# Patient Record
Sex: Female | Born: 1980
Health system: Southern US, Community
[De-identification: ages and names within clinical notes are randomized; demographics above are authoritative.]

## PROBLEM LIST (undated history)

## (undated) ENCOUNTER — Emergency Department (HOSPITAL_BASED_OUTPATIENT_CLINIC_OR_DEPARTMENT_OTHER): Payer: BC Managed Care – PPO

## (undated) DIAGNOSIS — IMO0002 Reserved for concepts with insufficient information to code with codable children: Secondary | ICD-10-CM

## (undated) DIAGNOSIS — B951 Streptococcus, group B, as the cause of diseases classified elsewhere: Secondary | ICD-10-CM

## (undated) DIAGNOSIS — O98819 Other maternal infectious and parasitic diseases complicating pregnancy, unspecified trimester: Secondary | ICD-10-CM

## (undated) DIAGNOSIS — F329 Major depressive disorder, single episode, unspecified: Secondary | ICD-10-CM

## (undated) DIAGNOSIS — E0590 Thyrotoxicosis, unspecified without thyrotoxic crisis or storm: Secondary | ICD-10-CM

## (undated) DIAGNOSIS — I10 Essential (primary) hypertension: Secondary | ICD-10-CM

## (undated) DIAGNOSIS — Z8619 Personal history of other infectious and parasitic diseases: Secondary | ICD-10-CM

## (undated) DIAGNOSIS — R87619 Unspecified abnormal cytological findings in specimens from cervix uteri: Secondary | ICD-10-CM

## (undated) DIAGNOSIS — Z3480 Encounter for supervision of other normal pregnancy, unspecified trimester: Secondary | ICD-10-CM

## (undated) DIAGNOSIS — L709 Acne, unspecified: Secondary | ICD-10-CM

## (undated) DIAGNOSIS — F32A Depression, unspecified: Secondary | ICD-10-CM

## (undated) DIAGNOSIS — F419 Anxiety disorder, unspecified: Secondary | ICD-10-CM

## (undated) DIAGNOSIS — T7840XA Allergy, unspecified, initial encounter: Secondary | ICD-10-CM

## (undated) HISTORY — DX: Depression, unspecified: F32.A

## (undated) HISTORY — DX: Thyrotoxicosis, unspecified without thyrotoxic crisis or storm: E05.90

## (undated) HISTORY — DX: Unspecified abnormal cytological findings in specimens from cervix uteri: R87.619

## (undated) HISTORY — DX: Acne, unspecified: L70.9

## (undated) HISTORY — DX: Reserved for concepts with insufficient information to code with codable children: IMO0002

## (undated) HISTORY — DX: Essential (primary) hypertension: I10

## (undated) HISTORY — DX: Allergy, unspecified, initial encounter: T78.40XA

## (undated) HISTORY — PX: BUNIONECTOMY: SHX129

## (undated) HISTORY — DX: Personal history of other infectious and parasitic diseases: Z86.19

## (undated) HISTORY — DX: Major depressive disorder, single episode, unspecified: F32.9

## (undated) HISTORY — PX: FOOT SURGERY: SHX648

## (undated) HISTORY — DX: Anxiety disorder, unspecified: F41.9

## (undated) HISTORY — PX: WISDOM TOOTH EXTRACTION: SHX21

---
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
## 2000-08-11 ENCOUNTER — Other Ambulatory Visit: Admission: RE | Admit: 2000-08-11 | Discharge: 2000-08-11 | Payer: Self-pay | Admitting: Internal Medicine

## 2001-09-03 ENCOUNTER — Other Ambulatory Visit: Admission: RE | Admit: 2001-09-03 | Discharge: 2001-09-03 | Payer: Self-pay | Admitting: Internal Medicine

## 2002-01-28 ENCOUNTER — Other Ambulatory Visit: Admission: RE | Admit: 2002-01-28 | Discharge: 2002-01-28 | Payer: Self-pay | Admitting: Internal Medicine

## 2002-09-10 ENCOUNTER — Other Ambulatory Visit: Admission: RE | Admit: 2002-09-10 | Discharge: 2002-09-10 | Payer: Self-pay | Admitting: Obstetrics and Gynecology

## 2003-03-31 ENCOUNTER — Other Ambulatory Visit: Admission: RE | Admit: 2003-03-31 | Discharge: 2003-03-31 | Payer: Self-pay | Admitting: Obstetrics and Gynecology

## 2003-09-15 ENCOUNTER — Other Ambulatory Visit: Admission: RE | Admit: 2003-09-15 | Discharge: 2003-09-15 | Payer: Self-pay | Admitting: Obstetrics and Gynecology

## 2004-03-16 ENCOUNTER — Other Ambulatory Visit: Admission: RE | Admit: 2004-03-16 | Discharge: 2004-03-16 | Payer: Self-pay | Admitting: Obstetrics and Gynecology

## 2004-10-07 ENCOUNTER — Other Ambulatory Visit: Admission: RE | Admit: 2004-10-07 | Discharge: 2004-10-07 | Payer: Self-pay | Admitting: Obstetrics and Gynecology

## 2004-11-12 ENCOUNTER — Ambulatory Visit: Payer: Self-pay | Admitting: Internal Medicine

## 2005-01-28 ENCOUNTER — Ambulatory Visit: Payer: Self-pay | Admitting: Internal Medicine

## 2005-03-14 ENCOUNTER — Ambulatory Visit: Payer: Self-pay | Admitting: Internal Medicine

## 2005-06-17 ENCOUNTER — Other Ambulatory Visit: Admission: RE | Admit: 2005-06-17 | Discharge: 2005-06-17 | Payer: Self-pay | Admitting: Obstetrics and Gynecology

## 2005-10-26 ENCOUNTER — Encounter: Admission: RE | Admit: 2005-10-26 | Discharge: 2005-10-26 | Payer: Self-pay | Admitting: Obstetrics and Gynecology

## 2005-12-29 ENCOUNTER — Inpatient Hospital Stay (HOSPITAL_COMMUNITY): Admission: AD | Admit: 2005-12-29 | Discharge: 2006-01-02 | Payer: Self-pay | Admitting: Obstetrics and Gynecology

## 2006-03-17 ENCOUNTER — Ambulatory Visit: Payer: Self-pay | Admitting: Internal Medicine

## 2006-07-20 ENCOUNTER — Ambulatory Visit: Payer: Self-pay | Admitting: Internal Medicine

## 2006-07-26 ENCOUNTER — Ambulatory Visit: Payer: Self-pay | Admitting: Internal Medicine

## 2006-12-22 ENCOUNTER — Ambulatory Visit: Payer: Self-pay | Admitting: Internal Medicine

## 2007-01-22 ENCOUNTER — Other Ambulatory Visit: Admission: RE | Admit: 2007-01-22 | Discharge: 2007-01-22 | Payer: Self-pay | Admitting: Gynecology

## 2007-01-26 ENCOUNTER — Ambulatory Visit: Payer: Self-pay | Admitting: Internal Medicine

## 2007-04-24 DIAGNOSIS — J020 Streptococcal pharyngitis: Secondary | ICD-10-CM | POA: Insufficient documentation

## 2007-04-27 ENCOUNTER — Ambulatory Visit: Payer: Self-pay | Admitting: Internal Medicine

## 2007-04-27 DIAGNOSIS — I10 Essential (primary) hypertension: Secondary | ICD-10-CM | POA: Insufficient documentation

## 2007-04-27 DIAGNOSIS — L708 Other acne: Secondary | ICD-10-CM | POA: Insufficient documentation

## 2007-05-01 ENCOUNTER — Telehealth (INDEPENDENT_AMBULATORY_CARE_PROVIDER_SITE_OTHER): Payer: Self-pay | Admitting: *Deleted

## 2007-07-02 ENCOUNTER — Telehealth: Payer: Self-pay | Admitting: Internal Medicine

## 2007-07-03 ENCOUNTER — Telehealth (INDEPENDENT_AMBULATORY_CARE_PROVIDER_SITE_OTHER): Payer: Self-pay | Admitting: *Deleted

## 2007-07-09 ENCOUNTER — Telehealth (INDEPENDENT_AMBULATORY_CARE_PROVIDER_SITE_OTHER): Payer: Self-pay | Admitting: *Deleted

## 2007-08-31 ENCOUNTER — Ambulatory Visit: Payer: Self-pay | Admitting: Internal Medicine

## 2007-08-31 DIAGNOSIS — J309 Allergic rhinitis, unspecified: Secondary | ICD-10-CM | POA: Insufficient documentation

## 2007-10-09 ENCOUNTER — Telehealth (INDEPENDENT_AMBULATORY_CARE_PROVIDER_SITE_OTHER): Payer: Self-pay | Admitting: *Deleted

## 2007-11-26 ENCOUNTER — Telehealth: Payer: Self-pay | Admitting: Internal Medicine

## 2007-11-30 ENCOUNTER — Ambulatory Visit: Payer: Self-pay | Admitting: Internal Medicine

## 2008-01-14 ENCOUNTER — Telehealth: Payer: Self-pay | Admitting: Internal Medicine

## 2008-02-01 ENCOUNTER — Telehealth: Payer: Self-pay | Admitting: Internal Medicine

## 2008-03-20 ENCOUNTER — Encounter: Admission: RE | Admit: 2008-03-20 | Discharge: 2008-03-20 | Payer: Self-pay | Admitting: Gynecology

## 2008-04-04 ENCOUNTER — Ambulatory Visit: Payer: Self-pay | Admitting: Internal Medicine

## 2008-04-04 DIAGNOSIS — F411 Generalized anxiety disorder: Secondary | ICD-10-CM | POA: Insufficient documentation

## 2008-04-04 LAB — CONVERTED CEMR LAB
CO2: 27 meq/L (ref 19–32)
Calcium: 9.5 mg/dL (ref 8.4–10.5)
Creatinine, Ser: 0.6 mg/dL (ref 0.4–1.2)
GFR calc Af Amer: 155 mL/min
Glucose, Bld: 77 mg/dL (ref 70–99)
Sodium: 137 meq/L (ref 135–145)

## 2008-04-09 ENCOUNTER — Telehealth: Payer: Self-pay | Admitting: Internal Medicine

## 2008-06-10 ENCOUNTER — Telehealth: Payer: Self-pay | Admitting: *Deleted

## 2008-06-24 ENCOUNTER — Telehealth: Payer: Self-pay | Admitting: Internal Medicine

## 2008-06-27 ENCOUNTER — Ambulatory Visit: Payer: Self-pay | Admitting: Internal Medicine

## 2008-06-27 DIAGNOSIS — B9689 Other specified bacterial agents as the cause of diseases classified elsewhere: Secondary | ICD-10-CM | POA: Insufficient documentation

## 2008-06-27 DIAGNOSIS — K5900 Constipation, unspecified: Secondary | ICD-10-CM | POA: Insufficient documentation

## 2008-06-27 DIAGNOSIS — N760 Acute vaginitis: Secondary | ICD-10-CM | POA: Insufficient documentation

## 2008-06-28 ENCOUNTER — Encounter: Payer: Self-pay | Admitting: Internal Medicine

## 2008-06-30 LAB — CONVERTED CEMR LAB: Chlamydia, DNA Probe: NEGATIVE

## 2008-07-11 ENCOUNTER — Telehealth: Payer: Self-pay | Admitting: Internal Medicine

## 2008-10-21 ENCOUNTER — Telehealth: Payer: Self-pay | Admitting: Internal Medicine

## 2008-11-06 ENCOUNTER — Telehealth: Payer: Self-pay | Admitting: Internal Medicine

## 2008-12-18 ENCOUNTER — Ambulatory Visit: Payer: Self-pay | Admitting: Internal Medicine

## 2008-12-18 LAB — CONVERTED CEMR LAB
ALT: 14 units/L (ref 0–35)
AST: 16 units/L (ref 0–37)
Albumin: 3.5 g/dL (ref 3.5–5.2)
Alkaline Phosphatase: 39 units/L (ref 39–117)
Basophils Relative: 0.4 % (ref 0.0–3.0)
Bilirubin, Direct: 0.1 mg/dL (ref 0.0–0.3)
CO2: 28 meq/L (ref 19–32)
Calcium: 8.7 mg/dL (ref 8.4–10.5)
Creatinine, Ser: 0.8 mg/dL (ref 0.4–1.2)
Eosinophils Absolute: 0.2 10*3/uL (ref 0.0–0.7)
Eosinophils Relative: 1.9 % (ref 0.0–5.0)
GFR calc non Af Amer: 110.36 mL/min (ref >60)
HDL: 44 mg/dL (ref >39.00)
Hemoglobin, Urine: NEGATIVE
Hemoglobin: 12.4 g/dL (ref 12.0–15.0)
Ketones, ur: NEGATIVE mg/dL
LDL Cholesterol: 73 mg/dL (ref 0–99)
Lymphocytes Relative: 26.5 % (ref 12.0–46.0)
Monocytes Relative: 6.1 % (ref 3.0–12.0)
Neutro Abs: 6.1 10*3/uL (ref 1.4–7.7)
Neutrophils Relative %: 65.1 % (ref 43.0–77.0)
RBC: 4.08 M/uL (ref 3.87–5.11)
Sodium: 139 meq/L (ref 135–145)
Total CHOL/HDL Ratio: 3
Total Protein, Urine: NEGATIVE mg/dL
Total Protein: 6.6 g/dL (ref 6.0–8.3)
Triglycerides: 67 mg/dL (ref 0.0–149.0)
Urine Glucose: NEGATIVE mg/dL
WBC: 9.4 10*3/uL (ref 4.5–10.5)

## 2008-12-25 ENCOUNTER — Ambulatory Visit: Payer: Self-pay | Admitting: Internal Medicine

## 2008-12-26 ENCOUNTER — Telehealth: Payer: Self-pay | Admitting: Internal Medicine

## 2009-02-27 ENCOUNTER — Encounter: Payer: Self-pay | Admitting: Internal Medicine

## 2009-03-11 ENCOUNTER — Telehealth (INDEPENDENT_AMBULATORY_CARE_PROVIDER_SITE_OTHER): Payer: Self-pay | Admitting: *Deleted

## 2009-04-03 ENCOUNTER — Telehealth: Payer: Self-pay | Admitting: Internal Medicine

## 2009-05-08 ENCOUNTER — Telehealth: Payer: Self-pay | Admitting: Internal Medicine

## 2009-05-08 DIAGNOSIS — F329 Major depressive disorder, single episode, unspecified: Secondary | ICD-10-CM | POA: Insufficient documentation

## 2009-05-14 ENCOUNTER — Ambulatory Visit: Payer: Self-pay | Admitting: Licensed Clinical Social Worker

## 2009-05-14 ENCOUNTER — Encounter: Admission: RE | Admit: 2009-05-14 | Discharge: 2009-05-14 | Payer: Self-pay | Admitting: Gynecology

## 2009-05-29 ENCOUNTER — Telehealth: Payer: Self-pay | Admitting: Internal Medicine

## 2009-06-19 ENCOUNTER — Ambulatory Visit: Payer: Self-pay | Admitting: Internal Medicine

## 2009-06-19 LAB — CONVERTED CEMR LAB
CO2: 28 meq/L (ref 19–32)
Chloride: 107 meq/L (ref 96–112)
Creatinine, Ser: 0.7 mg/dL (ref 0.4–1.2)
Glucose, Bld: 95 mg/dL (ref 70–99)

## 2009-06-26 ENCOUNTER — Ambulatory Visit: Payer: Self-pay | Admitting: Internal Medicine

## 2009-08-04 ENCOUNTER — Emergency Department (HOSPITAL_COMMUNITY): Admission: EM | Admit: 2009-08-04 | Discharge: 2009-08-04 | Payer: Self-pay | Admitting: Family Medicine

## 2009-08-05 ENCOUNTER — Encounter (INDEPENDENT_AMBULATORY_CARE_PROVIDER_SITE_OTHER): Payer: Self-pay | Admitting: *Deleted

## 2009-08-12 ENCOUNTER — Ambulatory Visit: Payer: Self-pay | Admitting: Internal Medicine

## 2009-08-18 ENCOUNTER — Telehealth: Payer: Self-pay | Admitting: Internal Medicine

## 2009-09-02 ENCOUNTER — Telehealth: Payer: Self-pay | Admitting: Internal Medicine

## 2009-10-14 ENCOUNTER — Ambulatory Visit: Payer: Self-pay | Admitting: Internal Medicine

## 2009-10-14 DIAGNOSIS — R635 Abnormal weight gain: Secondary | ICD-10-CM | POA: Insufficient documentation

## 2009-10-21 DIAGNOSIS — M940 Chondrocostal junction syndrome [Tietze]: Secondary | ICD-10-CM | POA: Insufficient documentation

## 2009-10-30 ENCOUNTER — Telehealth: Payer: Self-pay | Admitting: Internal Medicine

## 2009-12-18 ENCOUNTER — Ambulatory Visit: Payer: Self-pay | Admitting: Internal Medicine

## 2010-01-26 ENCOUNTER — Telehealth: Payer: Self-pay | Admitting: Internal Medicine

## 2010-03-19 ENCOUNTER — Ambulatory Visit: Payer: Self-pay | Admitting: Internal Medicine

## 2010-04-15 ENCOUNTER — Encounter: Payer: Self-pay | Admitting: Internal Medicine

## 2010-04-26 ENCOUNTER — Ambulatory Visit: Payer: Self-pay | Admitting: Internal Medicine

## 2010-04-29 ENCOUNTER — Telehealth: Payer: Self-pay | Admitting: Internal Medicine

## 2010-05-14 ENCOUNTER — Telehealth: Payer: Self-pay | Admitting: Internal Medicine

## 2010-05-28 ENCOUNTER — Telehealth: Payer: Self-pay | Admitting: Internal Medicine

## 2010-05-31 ENCOUNTER — Encounter: Admission: RE | Admit: 2010-05-31 | Discharge: 2010-05-31 | Payer: Self-pay | Admitting: Gynecology

## 2010-06-07 ENCOUNTER — Ambulatory Visit: Payer: Self-pay | Admitting: Internal Medicine

## 2010-06-16 ENCOUNTER — Telehealth: Payer: Self-pay | Admitting: Internal Medicine

## 2010-06-29 ENCOUNTER — Telehealth: Payer: Self-pay | Admitting: Internal Medicine

## 2010-06-30 ENCOUNTER — Telehealth: Payer: Self-pay | Admitting: Internal Medicine

## 2010-06-30 DIAGNOSIS — R142 Eructation: Secondary | ICD-10-CM

## 2010-06-30 DIAGNOSIS — R143 Flatulence: Secondary | ICD-10-CM

## 2010-06-30 DIAGNOSIS — R141 Gas pain: Secondary | ICD-10-CM | POA: Insufficient documentation

## 2010-07-20 ENCOUNTER — Encounter: Payer: Self-pay | Admitting: Internal Medicine

## 2010-09-03 ENCOUNTER — Telehealth: Payer: Self-pay | Admitting: Internal Medicine

## 2010-09-08 ENCOUNTER — Telehealth: Payer: Self-pay | Admitting: Internal Medicine

## 2010-09-09 ENCOUNTER — Ambulatory Visit: Payer: Self-pay | Admitting: Family Medicine

## 2010-09-09 LAB — CONVERTED CEMR LAB
Bilirubin Urine: NEGATIVE
Blood in Urine, dipstick: NEGATIVE
Glucose, Urine, Semiquant: NEGATIVE
Urobilinogen, UA: 0.2
pH: 7

## 2010-10-17 LAB — CONVERTED CEMR LAB: Pap Smear: NORMAL

## 2010-10-19 NOTE — Progress Notes (Signed)
Summary: med request  Phone Note Call from Patient Call back at [phone]   Summary of Call: Has appt 10-19.  Requesting med for symptoms of odor & itching female area, on antibiotic from foot surgery Mon Dr. Lysle Morales,  cephalexin 500mg  by mouth & IV during surgery.  Also caused itching all over & they have had her stop it.  Taking Benadryl.  [facility]  NK[facility].   Initial call taken by: Rudy Jew, RN,  June 16, 2010 9:55 AM  Follow-up for Phone Call        per dr Lovell Sheehan- may have flagyl 500 two times a day for 7 days Follow-up by: Willy Eddy, LPN,  June 16, 2010 10:36 AM   New Allergies: ! CEPHALEXIN (CEPHALEXIN) New/Updated Medications: METRONIDAZOLE 500 MG TABS (METRONIDAZOLE) One two times a day for 7 days New Allergies: ! CEPHALEXIN (CEPHALEXIN)Prescriptions: METRONIDAZOLE 500 MG TABS (METRONIDAZOLE) One two times a day for 7 days  #14 x 0   Entered by:   Rudy Jew, RN   Authorized by:   Stacie Glaze MD   Signed by:   Rudy Jew, RN on 06/16/2010   Method used:   [facility] [facility] [address] [id number]       [address]       [place], [place]  [address]       Ph: [phone]       Fax: [phone]   RxID:   [id number]

## 2010-10-19 NOTE — Letter (Signed)
[facility]: [facility]   Imported By: Sherian Rein 08/04/2010 09:50:39  _____________________________________________________________________  External Attachment:    Type:   Image     Comment:   External Document

## 2010-10-19 NOTE — Assessment & Plan Note (Signed)
Summary: CONCERNS W/ MEDS // RS   Vital Signs:  Patient profile:   30 year old female Height:      67 inches Weight:      179 pounds Temp:     98.3 degrees F oral Pulse rate:   72 / minute Resp:     14 per minute BP sitting:   140 / 82  (left arm)  Vitals Entered By: Willy Eddy, LPN (October 14, 2009 11:59 AM) CC: DISCUSS MED REGIMEN   CC:  DISCUSS MED REGIMEN.  History of Present Illness: Issues of weight control still fatiques and lack of motivation overweight and the weight puts pressure on the rib cage the pt has body image issues that create increased epression has cut down with sodas and feels  still gaining weight  Preventive Screening-Counseling & Management  Alcohol-Tobacco     Smoking Status: never  Problems Prior to Update: 1)  Weight Gain  (ICD-783.1) 2)  Sebaceous Cyst, Neck  (ICD-706.2) 3)  Depression  (ICD-311) 4)  Preventive Health Care  (ICD-V70.0) 5)  Constipation, Mild  (ICD-564.00) 6)  Vaginitis, Bacterial  (ICD-616.10) 7)  Anxiety State, Unspecified  (ICD-300.00) 8)  Allergic Rhinitis  (ICD-477.9) 9)  Streptococcal Pharyngitis  (ICD-034.0) 10)  Acne Nec  (ICD-706.1) 11)  Hypertension  (ICD-401.9)  Medications Prior to Update: 1)  Yaz 3-0.02 Mg Tabs (Drospirenone-Ethinyl Estradiol) .... .uad 2)  Benicar Hct 20-12.5 Mg Tabs (Olmesartan Medoxomil-Hctz) .Marland Kitchen.. 1 Once Daily 3)  Sertraline Hcl 50 Mg Tabs (Sertraline Hcl) .... One By Mouth Daily 4)  Sertraline Hcl 100 Mg Tabs (Sertraline Hcl) .... One Daily  Current Medications (verified): 1)  Yaz 3-0.02 Mg Tabs (Drospirenone-Ethinyl Estradiol) .... .uad 2)  Benicar Hct 20-12.5 Mg Tabs (Olmesartan Medoxomil-Hctz) .Marland Kitchen.. 1 Once Daily 3)  Sertraline Hcl 100 Mg Tabs (Sertraline Hcl) .... One Daily 4)  Phentermine Hcl 37.5 Mg Caps (Phentermine Hcl) .... Onepo Daily  Allergies (verified): No Known Drug Allergies  Past History:  Family History: Last updated: 04/27/2007 Family History High  cholesterol Family History Hypertension  Social History: Last updated: 04/27/2007 Married Never Smoked  Risk Factors: Smoking Status: never (10/14/2009)  Past medical, surgical, family and social histories (including risk factors) reviewed, and no changes noted (except as noted below).  Past Medical History: Reviewed history from 08/31/2007 and no changes required. Hypertension 706.1 acne Allergic rhinitis  Past Surgical History: Reviewed history from 04/27/2007 and no changes required. wisdom teeth extraction 1998 childbirth 2007  Family History: Reviewed history from 04/27/2007 and no changes required. Family History High cholesterol Family History Hypertension  Social History: Reviewed history from 04/27/2007 and no changes required. Married Never Smoked  Review of Systems  The patient denies anorexia, fever, weight loss, weight gain, vision loss, decreased hearing, hoarseness, chest pain, syncope, dyspnea on exertion, peripheral edema, prolonged cough, headaches, hemoptysis, abdominal pain, melena, hematochezia, severe indigestion/heartburn, hematuria, incontinence, genital sores, muscle weakness, suspicious skin lesions, transient blindness, difficulty walking, depression, unusual weight change, abnormal bleeding, enlarged lymph nodes, angioedema, and breast masses.    Physical Exam  General:  Well-developed,well-nourished,in no acute distress; alert,appropriate and cooperative throughout examination Head:  Normocephalic and atraumatic without obvious abnormalities. No apparent alopecia or balding. Ears:  R ear normal and L ear normal.   Nose:  no external deformity and no nasal discharge.   Neck:  No deformities, masses, or tenderness noted. Chest Wall:  no deformities and costochondrial tenderness.   Lungs:  normal respiratory effort and no intercostal retractions.  Heart:  normal rate and regular rhythm.   Abdomen:  soft and non-tender.     Impression &  Recommendations:  Problem # 1:  DEPRESSION (ICD-311)  The following medications were removed from the medication list:    Sertraline Hcl 50 Mg Tabs (Sertraline hcl) ..... One by mouth daily Her updated medication list for this problem includes:    Sertraline Hcl 100 Mg Tabs (Sertraline hcl) ..... One daily  Discussed treatment options, including trial of antidpressant medication. Will refer to behavioral health. Follow-up call in in 24-48 hours and recheck in 2 weeks, sooner as needed. Patient agrees to call if any worsening of symptoms or thoughts of doing harm arise. Verified that the patient has no suicidal ideation at this time.   Problem # 2:  WEIGHT GAIN (ICD-783.1)  Problem # 3:  HYPERTENSION (ICD-401.9)  Her updated medication list for this problem includes:    Benicar Hct 20-12.5 Mg Tabs (Olmesartan medoxomil-hctz) .Marland Kitchen... 1 once daily  BP today: 140/82 Prior BP: 140/80 (08/12/2009)  Prior 10 Yr Risk Heart Disease: 1 % (12/25/2008)  Labs Reviewed: K+: 4.0 (06/19/2009) Creat: : 0.7 (06/19/2009)   Chol: 130 (12/18/2008)   HDL: 44.00 (12/18/2008)   LDL: 73 (12/18/2008)   TG: 67.0 (12/18/2008)  Problem # 4:  COSTOCHONDRITIS (ICD-733.6) mild persistnat rib cage tenderness pssible due to position at work , ergonomic discussion about chair and work position  Complete Medication List: 1)  Yaz 3-0.02 Mg Tabs (Drospirenone-ethinyl estradiol) .... .uad 2)  Benicar Hct 20-12.5 Mg Tabs (Olmesartan medoxomil-hctz) .Marland Kitchen.. 1 once daily 3)  Sertraline Hcl 100 Mg Tabs (Sertraline hcl) .... One daily 4)  Phentermine Hcl 37.5 Mg Caps (Phentermine hcl) .... Onepo daily  Patient Instructions: 1)  Please schedule a follow-up appointment in 2 months. Prescriptions: PHENTERMINE HCL 37.5 MG CAPS (PHENTERMINE HCL) onepo daily  #30 x 2   Entered and Authorized by:   Stacie Glaze MD   Signed by:   Stacie Glaze MD on 10/14/2009   Method used:   Print then Give to Patient   RxID:    [id number]

## 2010-10-19 NOTE — Progress Notes (Signed)
  Phone Note Call from Patient   Caller: Patient Call For: Stacie Glaze MD Complaint: Urinary/GYN Problems Summary of Call: C/o vaginal irritation; soreness, just finished cycle. No discharge. Can she have Diflucan- has had yeast inf before. [facility].  At home. Initial call taken by: Raechel Ache, RN,  October 30, 2009 2:38 PM  Follow-up for Phone Call        per dr Lovell Sheehan ok for 1 diflucan, but pt instructed to call gyn for ov if signs persist Follow-up by: Willy Eddy, LPN,  October 30, 2009 3:04 PM    New/Updated Medications: DIFLUCAN 150 MG TABS (FLUCONAZOLE) take Prescriptions: DIFLUCAN 150 MG TABS (FLUCONAZOLE) take  #1 x 0   Entered by:   Willy Eddy, LPN   Authorized by:   Stacie Glaze MD   Signed by:   Willy Eddy, LPN on 09/81/1914   Method used:   [facility]        [facility] [facility] [address] [phone]       [address]       [place], [place]  [address]       Ph: [phone]       Fax: [phone]   RxID:   [id number]

## 2010-10-19 NOTE — Letter (Signed)
Summary: Gynecology-Dr. Beather Arbour  Gynecology-Dr. Beather Arbour   Imported By: Maryln Gottron 04/26/2010 11:13:46  _____________________________________________________________________  External Attachment:    Type:   Image     Comment:   External Document

## 2010-10-19 NOTE — Assessment & Plan Note (Signed)
Summary: 6 weeks fup//ccm   Vital Signs:  Patient profile:   30 year old female Height:      67 inches Weight:      166 pounds BMI:     26.09 Temp:     98.2 degrees F oral Pulse rate:   80 / minute Resp:     14 per minute BP sitting:   126 / 82  (left arm)  Vitals Entered By: Willy Eddy, LPN (June 07, 2010 4:15 PM) CC: roa-viibrid- didnt help any, Hypertension Management Is Patient Diabetic? No   Primary Care Provider:  Stacie Glaze MD  CC:  roa-viibrid- didnt help any and Hypertension Management.  History of Present Illness: follow up of depression has foot surgery on monday has worries about out of work and is tearfull and "wants" to be happy she has not been on a mood stabilizer  Hypertension History:      She denies headache, chest pain, palpitations, dyspnea with exertion, orthopnea, PND, peripheral edema, visual symptoms, neurologic problems, syncope, and side effects from treatment.        Positive major cardiovascular risk factors include hypertension and family history for ischemic heart disease (females less than 36 years old & males less than 60 years old).  Negative major cardiovascular risk factors include female age less than 33 years old, no history of diabetes or hyperlipidemia, and non-tobacco-user status.        Further assessment for target organ damage reveals no history of ASHD, cardiac end-organ damage (CHF/LVH), stroke/TIA, peripheral vascular disease, renal insufficiency, or hypertensive retinopathy.     Preventive Screening-Counseling & Management  Alcohol-Tobacco     Smoking Status: never     Tobacco Counseling: not indicated; no tobacco use  Problems Prior to Update: 1)  Costochondritis  (ICD-733.6) 2)  Weight Gain  (ICD-783.1) 3)  Sebaceous Cyst, Neck  (ICD-706.2) 4)  Depression  (ICD-311) 5)  Preventive Health Care  (ICD-V70.0) 6)  Constipation, Mild  (ICD-564.00) 7)  Vaginitis, Bacterial  (ICD-616.10) 8)  Anxiety State,  Unspecified  (ICD-300.00) 9)  Allergic Rhinitis  (ICD-477.9) 10)  Streptococcal Pharyngitis  (ICD-034.0) 11)  Acne Nec  (ICD-706.1) 12)  Hypertension  (ICD-401.9)  Current Problems (verified): 1)  Costochondritis  (ICD-733.6) 2)  Weight Gain  (ICD-783.1) 3)  Sebaceous Cyst, Neck  (ICD-706.2) 4)  Depression  (ICD-311) 5)  Preventive Health Care  (ICD-V70.0) 6)  Constipation, Mild  (ICD-564.00) 7)  Vaginitis, Bacterial  (ICD-616.10) 8)  Anxiety State, Unspecified  (ICD-300.00) 9)  Allergic Rhinitis  (ICD-477.9) 10)  Streptococcal Pharyngitis  (ICD-034.0) 11)  Acne Nec  (ICD-706.1) 12)  Hypertension  (ICD-401.9)  Medications Prior to Update: 1)  Yaz 3-0.02 Mg Tabs (Drospirenone-Ethinyl Estradiol) .... .uad 2)  Benicar Hct 20-12.5 Mg Tabs (Olmesartan Medoxomil-Hctz) .Marland Kitchen.. 1 Once Daily 3)  Methylphenidate Hcl Cr 20 Mg Cr-Tabs (Methylphenidate Hcl) .... One By Mouth Daily 4)  Phentermine Hcl 37.5 Mg Tabs (Phentermine Hcl) .Marland Kitchen.. 1 Once Daily 5)  Viibrid .Marland Kitchen.. 58m By Mouth Daily 6)  Zithromax Z-Pak 250 Mg Tabs (Azithromycin) .... As Directed 7)  Allegra-D 24 Hour 180-240 Mg Xr24h-Tab (Fexofenadine-Pseudoephedrine) .... One By Mouth Daily 8)  Diflucan 100 Mg Tabs (Fluconazole) .... One Now and Repeat in 5 Days.  Current Medications (verified): 1)  Yaz 3-0.02 Mg Tabs (Drospirenone-Ethinyl Estradiol) .... .uad 2)  Benicar Hct 20-12.5 Mg Tabs (Olmesartan Medoxomil-Hctz) .Marland Kitchen.. 1 Once Daily 3)  Methylphenidate Hcl Cr 20 Mg Cr-Tabs (Methylphenidate Hcl) .... One By Mouth Daily  4)  Phentermine Hcl 37.5 Mg Tabs (Phentermine Hcl) .Marland Kitchen.. 1 Once Daily 5)  Viibrid .Marland Kitchen.. 60m By Mouth Daily 6)  Allegra-D 24 Hour 180-240 Mg Xr24h-Tab (Fexofenadine-Pseudoephedrine) .... One By Mouth Daily 7)  Lamictal Starter 25 (84)-100(14) Mg Kit (Lamotrigine) .... Take As Directed 8)  Lamotrigine 100 Mg Tabs (Lamotrigine) .... One By Mouth Two Times A Day  Allergies: 1)  ! Cephalexin  (Cephalexin)  Contraindications/Deferment of Procedures/Staging:    Test/Procedure: FLU VAX    Reason for deferment: patient declined   Past History:  Family History: Last updated: 04/27/2007 Family History High cholesterol Family History Hypertension  Social History: Last updated: 04/27/2007 Married Never Smoked  Risk Factors: Smoking Status: never (06/07/2010)  Past medical, surgical, family and social histories (including risk factors) reviewed, and no changes noted (except as noted below).  Past Medical History: Reviewed history from 08/31/2007 and no changes required. Hypertension 706.1 acne Allergic rhinitis  Past Surgical History: Reviewed history from 04/27/2007 and no changes required. wisdom teeth extraction 1998 childbirth 2007  Family History: Reviewed history from 04/27/2007 and no changes required. Family History High cholesterol Family History Hypertension  Social History: Reviewed history from 04/27/2007 and no changes required. Married Never Smoked  Review of Systems  The patient denies anorexia, fever, weight loss, weight gain, vision loss, decreased hearing, hoarseness, chest pain, syncope, dyspnea on exertion, peripheral edema, prolonged cough, headaches, hemoptysis, abdominal pain, melena, hematochezia, severe indigestion/heartburn, hematuria, incontinence, genital sores, muscle weakness, suspicious skin lesions, transient blindness, difficulty walking, depression, unusual weight change, abnormal bleeding, enlarged lymph nodes, angioedema, and breast masses.    Physical Exam  General:  Well-developed,well-nourished,in no acute distress; alert,appropriate and cooperative throughout examination Head:  Normocephalic and atraumatic without obvious abnormalities. No apparent alopecia or balding. Eyes:  pupils equal and pupils round.   Ears:  R ear normal and L ear normal.   Nose:  no external deformity and no nasal discharge.   Neck:  No  deformities, masses, or tenderness noted. Lungs:  normal respiratory effort and no intercostal retractions.   Heart:  normal rate and regular rhythm.   Abdomen:  soft and non-tender.   Msk:  No deformity or scoliosis noted of thoracic or lumbar spine.     Impression & Recommendations:  Problem # 1:  ANXIETY STATE, UNSPECIFIED (ICD-300.00)  lamictal trial as a mood stabilizer  Discussed medication use and relaxation techniques.   Problem # 2:  DEPRESSION (ICD-311) suspect a degreee iof bipolarity  Problem # 3:  HYPERTENSION (ICD-401.9)  Her updated medication list for this problem includes:    Benicar Hct 20-12.5 Mg Tabs (Olmesartan medoxomil-hctz) .Marland Kitchen... 1 once daily  BP today: 126/82 Prior BP: 140/84 (04/26/2010)  Prior 10 Yr Risk Heart Disease: 1 % (12/25/2008)  Labs Reviewed: K+: 4.0 (06/19/2009) Creat: : 0.7 (06/19/2009)   Chol: 130 (12/18/2008)   HDL: 44.00 (12/18/2008)   LDL: 73 (12/18/2008)   TG: 67.0 (12/18/2008)  Complete Medication List: 1)  Yaz 3-0.02 Mg Tabs (Drospirenone-ethinyl estradiol) .... .uad 2)  Benicar Hct 20-12.5 Mg Tabs (Olmesartan medoxomil-hctz) .Marland Kitchen.. 1 once daily 3)  Methylphenidate Hcl Cr 20 Mg Cr-tabs (Methylphenidate hcl) .... One by mouth daily 4)  Phentermine Hcl 37.5 Mg Tabs (Phentermine hcl) .Marland Kitchen.. 1 once daily 5)  Viibrid  .Marland Kitchen.. 36m by mouth daily 6)  Allegra-d 24 Hour 180-240 Mg Xr24h-tab (Fexofenadine-pseudoephedrine) .... One by mouth daily 7)  Lamictal Starter 25 (84)-100(14) Mg Kit (Lamotrigine) .... Take as directed 8)  Lamotrigine 100 Mg Tabs (Lamotrigine) .Marland KitchenMarland KitchenMarland Kitchen  One by mouth two times a day 9)  Metronidazole 500 Mg Tabs (Metronidazole) .... One two times a day for 7 days  Hypertension Assessment/Plan:      The patient's hypertensive risk group is category B: At least one risk factor (excluding diabetes) with no target organ damage.  Her calculated 10 year risk of coronary heart disease is 1 %.  Today's blood pressure is 126/82.  Her  blood pressure goal is < 140/90.  Patient Instructions: 1)  Please schedule a follow-up appointment in 6 weeks. Prescriptions: LAMOTRIGINE 100 MG TABS (LAMOTRIGINE) one by mouth two times a day  #180 x 0   Entered and Authorized by:   Stacie Glaze MD   Signed by:   Stacie Glaze MD on 06/07/2010   Method used:   Faxed to ...       [facility] (mail-order)             , [place]         Ph: [phone]       Fax: [phone]   RxID:   [id number] LAMICTAL STARTER 25 (84)-100(14) MG KIT (LAMOTRIGINE) take as directed  #1 pk x 0   Entered and Authorized by:   Stacie Glaze MD   Signed by:   Stacie Glaze MD on 06/07/2010   Method used:   [facility]        [facility] [address] [phone][facility]       [address]       [place], [place]  [address]       Ph: [phone]       Fax: [phone]   RxID:   [id number]

## 2010-10-19 NOTE — Assessment & Plan Note (Signed)
Summary: 2 MTH ROV // RS   Vital Signs:  Patient profile:   30 year old female Height:      67 inches Weight:      166 pounds BMI:     26.09 Temp:     98.2 degrees F oral Pulse rate:   76 / minute Resp:     14 per minute BP sitting:   130 / 80  (left arm)  Vitals Entered By: Willy Eddy, LPN (December 19, 6043 2:46 PM) CC: roa, Hypertension Management   CC:  roa and Hypertension Management.  History of Present Illness: follow up blood pressure loosing weight with the appetitie supressants exercizing  Hypertension History:      She denies headache, chest pain, palpitations, dyspnea with exertion, orthopnea, PND, peripheral edema, visual symptoms, neurologic problems, syncope, and side effects from treatment.        Positive major cardiovascular risk factors include hypertension and family history for ischemic heart disease (females less than 51 years old & males less than 61 years old).  Negative major cardiovascular risk factors include female age less than 16 years old, no history of diabetes or hyperlipidemia, and non-tobacco-user status.        Further assessment for target organ damage reveals no history of ASHD, cardiac end-organ damage (CHF/LVH), stroke/TIA, peripheral vascular disease, renal insufficiency, or hypertensive retinopathy.     Preventive Screening-Counseling & Management  Alcohol-Tobacco     Smoking Status: never  Problems Prior to Update: 1)  Costochondritis  (ICD-733.6) 2)  Weight Gain  (ICD-783.1) 3)  Sebaceous Cyst, Neck  (ICD-706.2) 4)  Depression  (ICD-311) 5)  Preventive Health Care  (ICD-V70.0) 6)  Constipation, Mild  (ICD-564.00) 7)  Vaginitis, Bacterial  (ICD-616.10) 8)  Anxiety State, Unspecified  (ICD-300.00) 9)  Allergic Rhinitis  (ICD-477.9) 10)  Streptococcal Pharyngitis  (ICD-034.0) 11)  Acne Nec  (ICD-706.1) 12)  Hypertension  (ICD-401.9)  Current Problems (verified): 1)  Costochondritis  (ICD-733.6) 2)  Weight Gain   (ICD-783.1) 3)  Sebaceous Cyst, Neck  (ICD-706.2) 4)  Depression  (ICD-311) 5)  Preventive Health Care  (ICD-V70.0) 6)  Constipation, Mild  (ICD-564.00) 7)  Vaginitis, Bacterial  (ICD-616.10) 8)  Anxiety State, Unspecified  (ICD-300.00) 9)  Allergic Rhinitis  (ICD-477.9) 10)  Streptococcal Pharyngitis  (ICD-034.0) 11)  Acne Nec  (ICD-706.1) 12)  Hypertension  (ICD-401.9)  Medications Prior to Update: 1)  Yaz 3-0.02 Mg Tabs (Drospirenone-Ethinyl Estradiol) .... .uad 2)  Benicar Hct 20-12.5 Mg Tabs (Olmesartan Medoxomil-Hctz) .Marland Kitchen.. 1 Once Daily 3)  Sertraline Hcl 100 Mg Tabs (Sertraline Hcl) .... One Daily 4)  Phentermine Hcl 37.5 Mg Caps (Phentermine Hcl) .... Onepo Daily 5)  Diflucan 150 Mg Tabs (Fluconazole) .... Take  Current Medications (verified): 1)  Yaz 3-0.02 Mg Tabs (Drospirenone-Ethinyl Estradiol) .... .uad 2)  Benicar Hct 20-12.5 Mg Tabs (Olmesartan Medoxomil-Hctz) .Marland Kitchen.. 1 Once Daily 3)  Sertraline Hcl 100 Mg Tabs (Sertraline Hcl) .... One Daily 4)  Phentermine Hcl 37.5 Mg Caps (Phentermine Hcl) .... Onepo Daily  Allergies (verified): No Known Drug Allergies  Past History:  Family History: Last updated: 04/27/2007 Family History High cholesterol Family History Hypertension  Social History: Last updated: 04/27/2007 Married Never Smoked  Risk Factors: Smoking Status: never (12/18/2009)  Past medical, surgical, family and social histories (including risk factors) reviewed, and no changes noted (except as noted below).  Past Medical History: Reviewed history from 08/31/2007 and no changes required. Hypertension 706.1 acne Allergic rhinitis  Past Surgical History: Reviewed  history from 04/27/2007 and no changes required. wisdom teeth extraction 1998 childbirth 2007  Family History: Reviewed history from 04/27/2007 and no changes required. Family History High cholesterol Family History Hypertension  Social History: Reviewed history from 04/27/2007 and  no changes required. Married Never Smoked  Review of Systems  The patient denies anorexia, fever, weight loss, weight gain, vision loss, decreased hearing, hoarseness, chest pain, syncope, dyspnea on exertion, peripheral edema, prolonged cough, headaches, hemoptysis, abdominal pain, melena, hematochezia, severe indigestion/heartburn, hematuria, incontinence, genital sores, muscle weakness, suspicious skin lesions, transient blindness, difficulty walking, depression, unusual weight change, abnormal bleeding, enlarged lymph nodes, angioedema, and breast masses.    Physical Exam  General:  Well-developed,well-nourished,in no acute distress; alert,appropriate and cooperative throughout examination Head:  Normocephalic and atraumatic without obvious abnormalities. No apparent alopecia or balding. Eyes:  pupils equal and pupils round.   Ears:  R ear normal and L ear normal.   Nose:  no external deformity and no nasal discharge.   Mouth:  pharynx pink and moist and no erythema.   Neck:  No deformities, masses, or tenderness noted. Lungs:  normal respiratory effort and no intercostal retractions.   Heart:  normal rate and regular rhythm.   Abdomen:  soft and non-tender.     Impression & Recommendations:  Problem # 1:  WEIGHT GAIN (ICD-783.1) continue the phenteriment due to good results  Problem # 2:  HYPERTENSION (ICD-401.9)  Her updated medication list for this problem includes:    Benicar Hct 20-12.5 Mg Tabs (Olmesartan medoxomil-hctz) .Marland Kitchen... 1 once daily  BP today: 130/80 Prior BP: 140/82 (10/14/2009)  Prior 10 Yr Risk Heart Disease: 1 % (12/25/2008)  Labs Reviewed: K+: 4.0 (06/19/2009) Creat: : 0.7 (06/19/2009)   Chol: 130 (12/18/2008)   HDL: 44.00 (12/18/2008)   LDL: 73 (12/18/2008)   TG: 67.0 (12/18/2008)  Complete Medication List: 1)  Yaz 3-0.02 Mg Tabs (Drospirenone-ethinyl estradiol) .... .uad 2)  Benicar Hct 20-12.5 Mg Tabs (Olmesartan medoxomil-hctz) .Marland Kitchen.. 1 once  daily 3)  Sertraline Hcl 100 Mg Tabs (Sertraline hcl) .... One daily 4)  Phentermine Hcl 37.5 Mg Caps (Phentermine hcl) .... Onepo daily  Hypertension Assessment/Plan:      The patient's hypertensive risk group is category B: At least one risk factor (excluding diabetes) with no target organ damage.  Her calculated 10 year risk of coronary heart disease is 1 %.  Today's blood pressure is 130/80.  Her blood pressure goal is < 140/90.  Patient Instructions: 1)  Please schedule a follow-up appointment in 3 months. Prescriptions: PHENTERMINE HCL 37.5 MG CAPS (PHENTERMINE HCL) onepo daily  #30 x 2   Entered and Authorized by:   Stacie Glaze MD   Signed by:   Stacie Glaze MD on 12/18/2009   Method used:   Print then Give to Patient   RxID:   [id number]

## 2010-10-19 NOTE — Assessment & Plan Note (Signed)
Summary: 3 month rov/njr   Vital Signs:  Patient profile:   30 year old female Height:      67 inches Weight:      158 pounds BMI:     24.84 Temp:     98.2 degrees F oral Pulse rate:   76 / minute Resp:     14 per minute BP sitting:   120 / 80  (left arm)  Vitals Entered By: Willy Eddy, LPN (March 19, 6072 4:50 PM) CC: roa- bp check, Hypertension Management, URI symptoms   CC:  roa- bp check, Hypertension Management, and URI symptoms.  History of Present Illness: one week hx of sore through an the right side  URI Symptoms      This is a 30 year old woman who presents with URI symptoms.  The patient reports nasal congestion, clear nasal discharge, and sore throat, but denies purulent nasal discharge, dry cough, productive cough, earache, and sick contacts.  The patient denies fever, low-grade fever (<100.5 degrees), fever of 100.5-103 degrees, fever of 103.1-104 degrees, fever to >104 degrees, stiff neck, dyspnea, wheezing, rash, vomiting, diarrhea, use of an antipyretic, and response to antipyretic.  The patient also reports itchy watery eyes and itchy throat.  The patient denies the following risk factors for Strep sinusitis: unilateral facial pain, unilateral nasal discharge, poor response to decongestant, double sickening, tooth pain, Strep exposure, tender adenopathy, and absence of cough.    Hypertension History:      She denies headache, chest pain, palpitations, dyspnea with exertion, orthopnea, PND, peripheral edema, visual symptoms, neurologic problems, syncope, and side effects from treatment.        Positive major cardiovascular risk factors include hypertension and family history for ischemic heart disease (females less than 61 years old & males less than 66 years old).  Negative major cardiovascular risk factors include female age less than 65 years old, no history of diabetes or hyperlipidemia, and non-tobacco-user status.        Further assessment for target organ  damage reveals no history of ASHD, cardiac end-organ damage (CHF/LVH), stroke/TIA, peripheral vascular disease, renal insufficiency, or hypertensive retinopathy.     Preventive Screening-Counseling & Management  Alcohol-Tobacco     Smoking Status: never  Problems Prior to Update: 1)  Costochondritis  (ICD-733.6) 2)  Weight Gain  (ICD-783.1) 3)  Sebaceous Cyst, Neck  (ICD-706.2) 4)  Depression  (ICD-311) 5)  Preventive Health Care  (ICD-V70.0) 6)  Constipation, Mild  (ICD-564.00) 7)  Vaginitis, Bacterial  (ICD-616.10) 8)  Anxiety State, Unspecified  (ICD-300.00) 9)  Allergic Rhinitis  (ICD-477.9) 10)  Streptococcal Pharyngitis  (ICD-034.0) 11)  Acne Nec  (ICD-706.1) 12)  Hypertension  (ICD-401.9)  Current Problems (verified): 1)  Costochondritis  (ICD-733.6) 2)  Weight Gain  (ICD-783.1) 3)  Sebaceous Cyst, Neck  (ICD-706.2) 4)  Depression  (ICD-311) 5)  Preventive Health Care  (ICD-V70.0) 6)  Constipation, Mild  (ICD-564.00) 7)  Vaginitis, Bacterial  (ICD-616.10) 8)  Anxiety State, Unspecified  (ICD-300.00) 9)  Allergic Rhinitis  (ICD-477.9) 10)  Streptococcal Pharyngitis  (ICD-034.0) 11)  Acne Nec  (ICD-706.1) 12)  Hypertension  (ICD-401.9)  Medications Prior to Update: 1)  Yaz 3-0.02 Mg Tabs (Drospirenone-Ethinyl Estradiol) .... .uad 2)  Benicar Hct 20-12.5 Mg Tabs (Olmesartan Medoxomil-Hctz) .Marland Kitchen.. 1 Once Daily 3)  Sertraline Hcl 100 Mg Tabs (Sertraline Hcl) .... One Daily 4)  Phentermine Hcl 37.5 Mg Caps (Phentermine Hcl) .... Onepo Daily  Current Medications (verified): 1)  Yaz 3-0.02 Mg  Tabs (Drospirenone-Ethinyl Estradiol) .... .uad 2)  Benicar Hct 20-12.5 Mg Tabs (Olmesartan Medoxomil-Hctz) .Marland Kitchen.. 1 Once Daily 3)  Sertraline Hcl 100 Mg Tabs (Sertraline Hcl) .... One Daily 4)  Methylphenidate Hcl Cr 20 Mg Cr-Tabs (Methylphenidate Hcl) .... One By Mouth Daily  Allergies (verified): No Known Drug Allergies  Past History:  Family History: Last updated:  04/27/2007 Family History High cholesterol Family History Hypertension  Social History: Last updated: 04/27/2007 Married Never Smoked  Risk Factors: Smoking Status: never (03/19/2010)  Past medical, surgical, family and social histories (including risk factors) reviewed, and no changes noted (except as noted below).  Past Medical History: Reviewed history from 08/31/2007 and no changes required. Hypertension 706.1 acne Allergic rhinitis  Past Surgical History: Reviewed history from 04/27/2007 and no changes required. wisdom teeth extraction 1998 childbirth 2007  Family History: Reviewed history from 04/27/2007 and no changes required. Family History High cholesterol Family History Hypertension  Social History: Reviewed history from 04/27/2007 and no changes required. Married Never Smoked  Review of Systems       The patient complains of hoarseness.  The patient denies anorexia, fever, weight loss, weight gain, vision loss, decreased hearing, chest pain, syncope, dyspnea on exertion, peripheral edema, prolonged cough, headaches, hemoptysis, abdominal pain, melena, hematochezia, severe indigestion/heartburn, hematuria, incontinence, genital sores, muscle weakness, suspicious skin lesions, transient blindness, difficulty walking, depression, unusual weight change, abnormal bleeding, enlarged lymph nodes, angioedema, and breast masses.    Physical Exam  General:  Well-developed,well-nourished,in no acute distress; alert,appropriate and cooperative throughout examination Head:  Normocephalic and atraumatic without obvious abnormalities. No apparent alopecia or balding. Eyes:  pupils equal and pupils round.   Ears:  R ear normal and L ear normal.   Nose:  no external deformity and no nasal discharge.   Mouth:  pharynx pink and moist and no erythema.   Neck:  No deformities, masses, or tenderness noted. Lungs:  normal respiratory effort and no intercostal retractions.    Heart:  normal rate and regular rhythm.   Abdomen:  soft and non-tender.   Msk:  No deformity or scoliosis noted of thoracic or lumbar spine.     Impression & Recommendations:  Problem # 1:  DEPRESSION (ICD-311)  Her updated medication list for this problem includes:    Sertraline Hcl 100 Mg Tabs (Sertraline hcl) ..... One daily  Discussed treatment options, including trial of antidpressant medication. Will refer to behavioral health. Follow-up call in in 24-48 hours and recheck in 2 weeks, sooner as needed. Patient agrees to call if any worsening of symptoms or thoughts of doing harm arise. Verified that the patient has no suicidal ideation at this time.   Problem # 2:  ANXIETY STATE, UNSPECIFIED (ICD-300.00) there is evidence of ADHD that  may be  though of as being anxious Her updated medication list for this problem includes:    Sertraline Hcl 100 Mg Tabs (Sertraline hcl) ..... One daily if this fails will consider lamictal Discussed medication use and relaxation techniques.   Problem # 3:  HYPERTENSION (ICD-401.9)  Her updated medication list for this problem includes:    Benicar Hct 20-12.5 Mg Tabs (Olmesartan medoxomil-hctz) .Marland Kitchen... 1 once daily  BP today: 120/80 Prior BP: 130/80 (12/18/2009)  Prior 10 Yr Risk Heart Disease: 1 % (12/25/2008)  Labs Reviewed: K+: 4.0 (06/19/2009) Creat: : 0.7 (06/19/2009)   Chol: 130 (12/18/2008)   HDL: 44.00 (12/18/2008)   LDL: 73 (12/18/2008)   TG: 67.0 (12/18/2008)  Problem # 4:  STREPTOCOCCAL PHARYNGITIS (ICD-034.0)  Instructed to complete antibiotics and call if not improved in 48 hours.   Complete Medication List: 1)  Yaz 3-0.02 Mg Tabs (Drospirenone-ethinyl estradiol) .... .uad 2)  Benicar Hct 20-12.5 Mg Tabs (Olmesartan medoxomil-hctz) .Marland Kitchen.. 1 once daily 3)  Sertraline Hcl 100 Mg Tabs (Sertraline hcl) .... One daily 4)  Methylphenidate Hcl Cr 20 Mg Cr-tabs (Methylphenidate hcl) .... One by mouth daily  Hypertension  Assessment/Plan:      The patient's hypertensive risk group is category B: At least one risk factor (excluding diabetes) with no target organ damage.  Her calculated 10 year risk of coronary heart disease is 1 %.  Today's blood pressure is 120/80.  Her blood pressure goal is < 140/90.  Patient Instructions: 1)  Please schedule a follow-up appointment in 1 month. Prescriptions: METHYLPHENIDATE HCL CR 20 MG CR-TABS (METHYLPHENIDATE HCL) one by mouth daily  #30 x 0   Entered and Authorized by:   Stacie Glaze MD   Signed by:   Stacie Glaze MD on 03/19/2010   Method used:   Print then Give to Patient   RxID:   [id number]

## 2010-10-19 NOTE — Progress Notes (Signed)
Summary: GERD?  Phone Note Call from Patient   Caller: Patient Call For: Stacie Glaze MD Reason for Call: Insurance Question Summary of Call: Pt is having issues with GERD, and indigestion, contipation x several months.  C/O burping and tasting food she has eaten previously with gas pain daily.  Takes Metamusil daily.   [phone] Initial call taken by: Lynann Beaver CMA,  Jan 26, 2010 9:51 AM  Follow-up for Phone Call        left message on machine try omeprazole otc Follow-up by: Willy Eddy, LPN,  Jan 26, 2010 10:13 AM

## 2010-10-19 NOTE — Progress Notes (Signed)
Summary: vibrid  Phone Note Call from Patient Call back at [facility] Phone [phone]   Summary of Call: Has finished one pack of the Vibrid and is at the 40mg  dose.  Asking if she starts over with low mg again or stay at 40mg .  Stay at 40 mg.   Initial call taken by: Rudy Jew, RN,  May 28, 2010 9:17 AM  Follow-up for Phone Call        stay at 40 may need rx  ok to refill Follow-up by: Stacie Glaze MD,  May 28, 2010 9:32 AM  Additional Follow-up for Phone Call Additional follow up Details #1::        Phone Call Completed.  Left message to inform. Additional Follow-up by: Rudy Jew, RN,  May 28, 2010 10:34 AM

## 2010-10-19 NOTE — Progress Notes (Signed)
Summary: diflucan  Phone Note Call from Patient Call back at Home Phone [phone]   Caller: vm Reason for Call: Acute Illness Summary of Call: Diflucan or something  requested for irritation private area & frequency after Zpak.  [phone] Initial call taken by: Rudy Jew, RN,  May 14, 2010 12:55 PM  Follow-up for Phone Call        per dr Yvonne Kendall have diflucan 100 today and repeat in 5 days Follow-up by: Willy Eddy, LPN,  May 14, 2010 2:15 PM    New/Updated Medications: DIFLUCAN 100 MG TABS (FLUCONAZOLE) one now and repeat in 5 days. Prescriptions: DIFLUCAN 100 MG TABS (FLUCONAZOLE) one now and repeat in 5 days.  #2 x 0   Entered by:   Lynann Beaver CMA   Authorized by:   Birdie Sons MD   Signed by:   Lynann Beaver CMA on 05/14/2010   Method used:   [facility] to        [facility]  [address] [phone]* (retail)       [address]       [place], [place]  [address]       Ph: [phone]       Fax: [phone]   RxID:   [id number]  Notified pt.

## 2010-10-19 NOTE — Progress Notes (Signed)
Summary: Pt req referral to GI doctor, Dr. Kinnie Scales  Phone Note Call from Patient Call back at Home Phone [phone]   Caller: Patient Reason for Call: Acute Illness Summary of Call: Pt called and said that she is still having abdominal discomfort. Pt says Gas-x isn't helping. Pt req referral to GI doctor. Pt req Dr. Sharrell Ku. Pls do referral and advise pt.    Initial call taken by: Lucy Antigua,  June 30, 2010 8:41 AM  Follow-up for Phone Call        referral sent to [facility] Follow-up by: Willy Eddy, LPN,  June 30, 2010 8:57 AM  New Problems: FLATULENCE ERUCTATION AND GAS PAIN (ICD-787.3)   New Problems: FLATULENCE ERUCTATION AND GAS PAIN (ICD-787.3)

## 2010-10-19 NOTE — Assessment & Plan Note (Signed)
Summary: 1 month follow up/cjr   Vital Signs:  Patient profile:   30 year old female Height:      67 inches Weight:      160 pounds BMI:     25.15 Temp:     98.3 degrees F oral Pulse rate:   92 / minute Resp:     14 per minute BP sitting:   140 / 84  (left arm)  Vitals Entered By: Willy Eddy, LPN (April 26, 2010 4:01 PM) CC: methylphenidate not working-stopped sertraline-   CC:  methylphenidate not working-stopped sertraline-.  History of Present Illness: pt has not responded to the ADD protocol drugs mild anxiety and aggitations without focus blood pressure is stable  Preventive Screening-Counseling & Management  Alcohol-Tobacco     Smoking Status: never  Current Problems (verified): 1)  Costochondritis  (ICD-733.6) 2)  Weight Gain  (ICD-783.1) 3)  Sebaceous Cyst, Neck  (ICD-706.2) 4)  Depression  (ICD-311) 5)  Preventive Health Care  (ICD-V70.0) 6)  Constipation, Mild  (ICD-564.00) 7)  Vaginitis, Bacterial  (ICD-616.10) 8)  Anxiety State, Unspecified  (ICD-300.00) 9)  Allergic Rhinitis  (ICD-477.9) 10)  Streptococcal Pharyngitis  (ICD-034.0) 11)  Acne Nec  (ICD-706.1) 12)  Hypertension  (ICD-401.9)  Current Medications (verified): 1)  Yaz 3-0.02 Mg Tabs (Drospirenone-Ethinyl Estradiol) .... .uad 2)  Benicar Hct 20-12.5 Mg Tabs (Olmesartan Medoxomil-Hctz) .Marland Kitchen.. 1 Once Daily 3)  Methylphenidate Hcl Cr 20 Mg Cr-Tabs (Methylphenidate Hcl) .... One By Mouth Daily 4)  Phentermine Hcl 37.5 Mg Tabs (Phentermine Hcl) .Marland Kitchen.. 1 Once Daily 5)  Viibrid .Marland Kitchen.. 30m By Mouth Daily  Allergies (verified): No Known Drug Allergies  Past History:  Family History: Last updated: 04/27/2007 Family History High cholesterol Family History Hypertension  Social History: Last updated: 04/27/2007 Married Never Smoked  Risk Factors: Smoking Status: never (04/26/2010)  Past medical, surgical, family and social histories (including risk factors) reviewed, and no changes noted  (except as noted below).  Past Medical History: Reviewed history from 08/31/2007 and no changes required. Hypertension 706.1 acne Allergic rhinitis  Past Surgical History: Reviewed history from 04/27/2007 and no changes required. wisdom teeth extraction 1998 childbirth 2007  Family History: Reviewed history from 04/27/2007 and no changes required. Family History High cholesterol Family History Hypertension  Social History: Reviewed history from 04/27/2007 and no changes required. Married Never Smoked  Review of Systems  The patient denies anorexia, fever, weight loss, weight gain, vision loss, decreased hearing, hoarseness, chest pain, syncope, dyspnea on exertion, peripheral edema, prolonged cough, headaches, hemoptysis, abdominal pain, melena, hematochezia, severe indigestion/heartburn, hematuria, incontinence, genital sores, muscle weakness, suspicious skin lesions, transient blindness, difficulty walking, depression, unusual weight change, abnormal bleeding, enlarged lymph nodes, angioedema, and breast masses.    Physical Exam  General:  Well-developed,well-nourished,in no acute distress; alert,appropriate and cooperative throughout examination Head:  Normocephalic and atraumatic without obvious abnormalities. No apparent alopecia or balding. Eyes:  pupils equal and pupils round.   Ears:  R ear normal and L ear normal.   Nose:  no external deformity and no nasal discharge.   Mouth:  pharynx pink and moist and no erythema.   Neck:  No deformities, masses, or tenderness noted. Chest Wall:  no deformities and costochondrial tenderness.   Lungs:  normal respiratory effort and no intercostal retractions.   Heart:  normal rate and regular rhythm.   Abdomen:  soft and non-tender.   Msk:  No deformity or scoliosis noted of thoracic or lumbar spine.  Impression & Recommendations:  Problem # 1:  DEPRESSION (ICD-311)  added ritalin to the sertraline the pt had bacterial  vaginitis she is concerned why she keeps getting this she was placed on flagyl the  pt has also been told that she may have type 2 herpes  The following medications were removed from the medication list:    Sertraline Hcl 100 Mg Tabs (Sertraline hcl) ..... One daily  Discussed treatment options, including trial of antidpressant medication. Will refer to behavioral health. Follow-up call in in 24-48 hours and recheck in 2 weeks, sooner as needed. Patient agrees to call if any worsening of symptoms or thoughts of doing harm arise. Verified that the patient has no suicidal ideation at this time.   Problem # 2:  VAGINITIS, BACTERIAL (ICD-616.10)  recurrent  Discussed symptomatic relief and treatment options.   Her updated medication list for this problem includes:    Zithromax Z-pak 250 Mg Tabs (Azithromycin) .Marland Kitchen... As directed  Problem # 3:  WEIGHT GAIN (ICD-783.1) due to the depression  Problem # 4:  HYPERTENSION (ICD-401.9) staBLE Her updated medication list for this problem includes:    Benicar Hct 20-12.5 Mg Tabs (Olmesartan medoxomil-hctz) .Marland Kitchen... 1 once daily  BP today: 140/84 Prior BP: 120/80 (03/19/2010)  Prior 10 Yr Risk Heart Disease: 1 % (12/25/2008)  Labs Reviewed: K+: 4.0 (06/19/2009) Creat: : 0.7 (06/19/2009)   Chol: 130 (12/18/2008)   HDL: 44.00 (12/18/2008)   LDL: 73 (12/18/2008)   TG: 67.0 (12/18/2008)  Complete Medication List: 1)  Yaz 3-0.02 Mg Tabs (Drospirenone-ethinyl estradiol) .... .uad 2)  Benicar Hct 20-12.5 Mg Tabs (Olmesartan medoxomil-hctz) .Marland Kitchen.. 1 once daily 3)  Methylphenidate Hcl Cr 20 Mg Cr-tabs (Methylphenidate hcl) .... One by mouth daily 4)  Phentermine Hcl 37.5 Mg Tabs (Phentermine hcl) .Marland Kitchen.. 1 once daily 5)  Viibrid  .Marland Kitchen.. 23m by mouth daily 6)  Zithromax Z-pak 250 Mg Tabs (Azithromycin) .... As directed 7)  Allegra-d 24 Hour 180-240 Mg Xr24h-tab (Fexofenadine-pseudoephedrine) .... One by mouth daily  Patient Instructions: 1)  Please schedule  a follow-up appointment in 1 month.

## 2010-10-19 NOTE — Progress Notes (Signed)
Summary: abd pain  Phone Note Call from Patient Call back at Home Phone [phone]   Caller: Patient Summary of Call: pt is having abd pain/gas  since bunion surgery 06-14-2010 by dr Ralene Cork please advise. [facility] [address]  Initial call taken by: Heron Sabins,  June 29, 2010 8:25 AM  Follow-up for Phone Call        may be coming from pain med- try gas x and drink liquid and try liquid diet until stomach settles down. pt informed Follow-up by: Willy Eddy, LPN,  June 29, 2010 9:41 AM

## 2010-10-19 NOTE — Progress Notes (Signed)
Summary: sinus complaints  Phone Note Call from Patient Call back at Work Phone [phone]   Caller: Patient Call For: Stacie Glaze MD Summary of Call: Post nasal drainage with nausea, and congestion in head.  No fever. Some eye pain.  [facility] [phone] Initial call taken by: Lynann Beaver CMA,  April 29, 2010 9:44 AM  Follow-up for Phone Call        per dr Lovell Sheehan may have zpack and otc allegra d Follow-up by: Willy Eddy, LPN,  April 29, 2010 12:26 PM    New/Updated Medications: ZITHROMAX Z-PAK 250 MG TABS (AZITHROMYCIN) as directed ALLEGRA-D 24 HOUR 180-240 MG XR24H-TAB (FEXOFENADINE-PSEUDOEPHEDRINE) one by mouth daily Prescriptions: ALLEGRA-D 24 HOUR 180-240 MG XR24H-TAB (FEXOFENADINE-PSEUDOEPHEDRINE) one by mouth daily  #30 x 5   Entered by:   Lynann Beaver CMA   Authorized by:   Stacie Glaze MD   Signed by:   Lynann Beaver CMA on 04/29/2010   Method used:   Electronically to        [facility] [id number] (retail)       [address]       [place], [place]  [address]       Ph: [phone]       Fax: [phone]   RxID:   [id number] Christena Deem Z-PAK 250 MG TABS (AZITHROMYCIN) as directed  #6 x 0   Entered by:   Lynann Beaver CMA   Authorized by:   Stacie Glaze MD   Signed by:   Lynann Beaver CMA on 04/29/2010   Method used:   [facility]ronically to        [facility]  [address] [id number] ([facility]ail)       [address]       [place], [place]  [address]       Ph: [phone]       Fax: [phone]   RxID:   [id number]

## 2010-10-21 NOTE — Progress Notes (Signed)
Summary: UTI  Phone Note Call from Patient   Caller: Patient Call For: Stacie Glaze MD Reason for Call: Acute Illness Complaint: Urinary/GYN Problems Summary of Call: complaining of dysuria, frequency, and urgency x 4-5 days.  No fever. [phone] [facility])  Initial call taken by: Lynann Beaver CMA AAMA,  September 08, 2010 9:06 AM  Follow-up for Phone Call        left message on machine must give speciamen and see someone Follow-up by: Willy Eddy, LPN,  September 08, 2010 9:58 AM

## 2010-10-21 NOTE — Assessment & Plan Note (Signed)
Summary: UTI ? // RS   Vital Signs:  Patient profile:   30 year old female Weight:      162 pounds BP sitting:   130 / 90  (left arm) Cuff size:   regular  Vitals Entered By: Sid Falcon LPN (September 09, 2010 4:40 PM)  History of Present Illness: Patient seen for the following:  onset about 5 days ago of burning with urination.  Symptoms mild and actually improved at this time.  Some mild frequency.  Diflucan called in for possible  yeast.  No discharge.  No hx chlamydia.  No fever.  Hypertenison hx.  Compliant with  meds.  "Rushed" to get here.  Some intermittent headaches  recently.  BP up and down by home readings.  Some recent intermittent bifrontal headaches with no purulent secretions.  Allergies: 1)  ! Cephalexin (Cephalexin)  Past History:  Past Medical History: Last updated: 08/31/2007 Hypertension 706.1 acne Allergic rhinitis  Review of Systems  The patient denies fever, weight loss, chest pain, dyspnea on exertion, peripheral edema, abdominal pain, hematuria, and incontinence.    Physical Exam  General:  Well-developed,well-nourished,in no acute distress; alert,appropriate and cooperative throughout examination Head:  Normocephalic and atraumatic without obvious abnormalities. No apparent alopecia or balding. Eyes:  pupils equal, pupils round, and pupils reactive to light.   Ears:  External ear exam shows no significant lesions or deformities.  Otoscopic examination reveals clear canals, tympanic membranes are intact bilaterally without bulging, retraction, inflammation or discharge. Hearing is grossly normal bilaterally. Mouth:  Oral mucosa and oropharynx without lesions or exudates.  Teeth in good repair. Neck:  No deformities, masses, or tenderness noted. Lungs:  Normal respiratory effort, chest expands symmetrically. Lungs are clear to auscultation, no crackles or wheezes. Heart:  Normal rate and regular rhythm. S1 and S2 normal without gallop, murmur,  click, rub or other extra sounds. Extremities:  no edema. Neurologic:  alert & oriented X3 and cranial nerves II-XII intact.     Impression & Recommendations:  Problem # 1:  DYSURIA (ICD-788.1)  no evidence for UTI The following medications were removed from the medication list:    Metronidazole 500 Mg Tabs (Metronidazole) ..... One two times a day for 7 days  Orders: UA Dipstick w/o Micro (automated)  (81003)  Problem # 2:  HYPERTENSION (ICD-401.9)  Her updated medication list for this problem includes:    Benicar Hct 20-12.5 Mg Tabs (Olmesartan medoxomil-hctz) .Marland Kitchen... 1 once daily  Complete Medication List: 1)  Yaz 3-0.02 Mg Tabs (Drospirenone-ethinyl estradiol) .... .uad 2)  Benicar Hct 20-12.5 Mg Tabs (Olmesartan medoxomil-hctz) .Marland Kitchen.. 1 once daily 3)  Methylphenidate Hcl Cr 20 Mg Cr-tabs (Methylphenidate hcl) .... One by mouth daily 4)  Phentermine Hcl 37.5 Mg Tabs (Phentermine hcl) .Marland Kitchen.. 1 once daily 5)  Viibrid  .Marland Kitchen.. 21m by mouth daily 6)  Allegra-d 24 Hour 180-240 Mg Xr24h-tab (Fexofenadine-pseudoephedrine) .... One by mouth daily 7)  Lamotrigine 100 Mg Tabs (Lamotrigine) .... One by mouth two times a day 8)  Diflucan 150 Mg Tabs (Fluconazole) .... One now and one in 5 days   Orders Added: 1)  UA Dipstick w/o Micro (automated)  [81003] 2)  Est. Patient Level III [16109]    Laboratory Results   Urine Tests    Routine Urinalysis   Color: yellow Appearance: Clear Glucose: negative   (Normal Range: Negative) Bilirubin: negative   (Normal Range: Negative) Ketone: negative   (Normal Range: Negative) Spec. Gravity: 1.025   (Normal Range: 1.003-1.035) Blood:  negative   (Normal Range: Negative) pH: 7.0   (Normal Range: 5.0-8.0) Protein: trace   (Normal Range: Negative) Urobilinogen: 0.2   (Normal Range: 0-1) Nitrite: negative   (Normal Range: Negative) Leukocyte Esterace: negative   (Normal Range: Negative)

## 2010-10-21 NOTE — Progress Notes (Signed)
Summary: Diflucan  Phone Note Call from Patient   Caller: Patient Call For: Stacie Glaze MD Reason for Call: Acute Illness Summary of Call: Pt is calling for Diflucan for a yeast infection.  [phone] Initial call taken by: Lynann Beaver CMA AAMA,  September 03, 2010 10:41 AM  Follow-up for Phone Call        per dr Lovell Sheehan- may have diflucan 150 now and 1 in 5 days Follow-up by: Willy Eddy, LPN,  September 03, 2010 11:34 AM    New/Updated Medications: DIFLUCAN 150 MG TABS (FLUCONAZOLE) one now and one in 5 days Prescriptions: DIFLUCAN 150 MG TABS (FLUCONAZOLE) one now and one in 5 days  #2 x 0   Entered by:   Lynann Beaver CMA AAMA   Authorized by:   Stacie Glaze MD   Signed by:   Lynann Beaver CMA AAMA on 09/03/2010   Method used:   [facility]        [facility] [address] [id number]       [address]       [place], [place]  [address]       Ph: [phone]       Fax: [phone]   RxID:   [id number]  LM on pt's voice mail.

## 2010-11-12 ENCOUNTER — Encounter: Payer: Self-pay | Admitting: Internal Medicine

## 2010-11-15 ENCOUNTER — Encounter: Payer: Self-pay | Admitting: Internal Medicine

## 2010-11-15 ENCOUNTER — Ambulatory Visit (INDEPENDENT_AMBULATORY_CARE_PROVIDER_SITE_OTHER): Payer: BC Managed Care – PPO | Admitting: Internal Medicine

## 2010-11-15 DIAGNOSIS — F411 Generalized anxiety disorder: Secondary | ICD-10-CM

## 2010-11-15 DIAGNOSIS — I10 Essential (primary) hypertension: Secondary | ICD-10-CM

## 2010-11-15 DIAGNOSIS — J309 Allergic rhinitis, unspecified: Secondary | ICD-10-CM

## 2010-11-15 MED ORDER — CLONAZEPAM 0.5 MG PO TABS: 0.5000 mg | ORAL_TABLET | Freq: Two times a day (BID) | ORAL | Status: DC

## 2010-11-15 NOTE — Progress Notes (Signed)
  Subjective:    Patient ID: Amanda Strong, female    DOB: 1980/10/10, 30 y.o.   MRN: [id number]  HPI Amanda Strong off the  lamital when she had foot surgery... [name] anxious all the time with the sensation of expecting another shoe to drop or something bad happen. She sleeps but does not feel that she can turn off her thoughts. She does not cope well.   Review of Systems     Objective:   Physical Exam        Assessment & Plan:  The pt has been of cymbalta, lamictal, paxil and zoloft.

## 2010-12-22 LAB — POCT PREGNANCY, URINE: Preg Test, Ur: NEGATIVE

## 2011-01-03 ENCOUNTER — Telehealth: Payer: Self-pay | Admitting: *Deleted

## 2011-01-03 NOTE — Telephone Encounter (Signed)
Well her appointment was for 4-27--the only thing we hav is 5-11 one of the acute- you can use that

## 2011-01-03 NOTE — Telephone Encounter (Signed)
Amanda Strong cannot keep her next appt re: titrating her meds, and wants Dr. Leretha Pol to call her to discuss it, or if Amanda Strong could help her find a different appt as there is nothing until May or June when she called last week.

## 2011-01-03 NOTE — Telephone Encounter (Signed)
Thanks norma!!you call sign it off after you read this

## 2011-01-03 NOTE — Telephone Encounter (Signed)
PT OV IS [facility] FOR 01-28-2011 11.45AM. PT IS AWARE

## 2011-02-01 ENCOUNTER — Telehealth: Payer: Self-pay | Admitting: *Deleted

## 2011-02-01 MED ORDER — FLUCONAZOLE 150 MG PO TABS: 150.0000 mg | ORAL_TABLET | Freq: Once | ORAL | Status: DC

## 2011-02-01 NOTE — Telephone Encounter (Signed)
Ok per dr jenkins 

## 2011-02-01 NOTE — Telephone Encounter (Signed)
Pt would like Diflucan for yeast symptoms.  Per [place], we can call this in. Left message on her voice mail to pick up meds.

## 2011-02-04 ENCOUNTER — Telehealth: Payer: Self-pay | Admitting: *Deleted

## 2011-02-04 NOTE — Discharge Summary (Signed)
Amanda Strong, Amanda Strong             ACCOUNT NO.:  [id number]   MEDICAL RECORD NO.:  [id number]          PATIENT TYPE:  INP   LOCATION:  9114                          FACILITY:  [facility]   PHYSICIAN:  Huel Cote, M.D. DATE OF BIRTH:  10-Dec-1980   DATE OF ADMISSION:  12/29/2005  DATE OF DISCHARGE:  01/02/2006                                 DISCHARGE SUMMARY   DISCHARGE DIAGNOSES:  1.  Term pregnancy at 14-5/7 weeks, delivered.  2.  Pregnancy-induced hypertension.  3.  Gestational diabetes controlled on Glyburide.  4.  Status post normal spontaneous vaginal delivery.  5.  Postpartum hypertension requiring medication.   DISCHARGE MEDICATIONS:  1.  Motrin 600 mg p.o. every 6 hours.  2.  Percocet 1-2 tablets p.o. every 4 hours p.r.n.  3.  Procardia XL 30 mg p.o. twice daily.   DISCHARGE FOLLOWUP:  The patient is to follow up in the office in  approximately 2 days for a blood pressure check.   HOSPITAL COURSE:  The patient is a 30 year old, G1, P0 who was admitted at  40-5/7 weeks for induction of labor given post dates.  Her prenatal care  been complicated by gestational diabetes with blood sugars controlled on  Glyburide 7.5 q.a.m. and 5 mg q.p.m.  She had a history of low-grade Pap  smear which was stable and was due for recheck on her postpartum Pap smear.  She also had a low lying placenta which resolved on follow-up ultrasound in  the last 2 weeks of her pregnancy.  She had been noted to have increased  blood pressures, however, all pre-eclamptic labs and workups were negative  as well as the patient had no preeclamptic symptoms.   PRENATAL LABORATORY DATA:  Blood type B positive, antibody negative, sickle  negative, RPR nonreactive, rubella immune, hepatitis B surface antigen  negative, HIV negative, GC negative, chlamydia negative, Group B  Streptococcus negative, 1-hour GTT 195, 3-hour GTT 119, 214, 189 and 173.   PAST OBSTETRICAL HISTORY:  None.   PAST GYNECOLOGICAL  HISTORY:  Low-grade Pap smear as noted.   PAST SURGICAL HISTORY:  Wisdom teeth.   PAST MEDICAL HISTORY:  None.   ALLERGIES:  No known drug allergies.   MEDICATIONS:  Glyburide only.   PHYSICAL EXAMINATION:  VITAL SIGNS:  Blood pressures were noted be 150-  170/93-100, fetal heart rate was reactive.  PELVIC:  Cervix was 32 and -2.  She had rupture of membranes performed.  PIH  labs were again checked and normal.   HOSPITAL COURSE:  The patient was placed on Pitocin and made cervical change  eventually reaching complete dilation in the early morning hours of December 30, 2005.  She pushed well once reaching complete and had a vaginal delivery  of a vigorous female infant over small first-degree laceration.  Apgars were 8  and 9 and weight was 7 pounds 15 ounces.  She had small laceration which was  repaired with 3-0 Vicryl on her right labia and in the vagina.  Cervix and  rectum were intact.  Her blood pressures remained 140-160/80-100 throughout  her labor and delivery.  These were observed closely postpartum.  Her blood  pressures did stay elevated reaching diastolics up to 109-113.  At this  point, the patient was placed on Procardia 30 mg which was gradually  increased to b.i.d.  She had been tried on labetalol initially, although the  blood pressure did not seem to be controlled on this medication.  By  postpartum day #3, she was doing quite well.  She had blood pressures at  that time 150-170/85-100 for the most part on her Procardia.  She was felt  stable for discharge home with no pre-eclamptic symptoms and therefore was  to follow up in my office in 2 days for another blood pressure check on her  medication.      Huel Cote, M.D.  Electronically Signed     KR/MEDQ  D:  03/03/2006  T:  03/03/2006  Job:  [id number]

## 2011-02-04 NOTE — Telephone Encounter (Signed)
Pt has GYN

## 2011-02-04 NOTE — Telephone Encounter (Signed)
Pt is still having discomfort in vaginal area even after taking the Diflucan 150 mg.  Is asking for a different RX.

## 2011-02-04 NOTE — Telephone Encounter (Signed)
Per dr Lovell Sheehan- send to gyn

## 2011-02-07 ENCOUNTER — Telehealth: Payer: Self-pay | Admitting: *Deleted

## 2011-02-07 ENCOUNTER — Ambulatory Visit: Payer: BC Managed Care – PPO | Admitting: Internal Medicine

## 2011-02-07 ENCOUNTER — Ambulatory Visit (INDEPENDENT_AMBULATORY_CARE_PROVIDER_SITE_OTHER): Payer: BC Managed Care – PPO | Admitting: Internal Medicine

## 2011-02-07 ENCOUNTER — Encounter: Payer: Self-pay | Admitting: Internal Medicine

## 2011-02-07 VITALS — BP 130/80 | HR 76 | Temp 98.2°F | Resp 14 | Ht 67.0 in | Wt 147.0 lb

## 2011-02-07 DIAGNOSIS — I10 Essential (primary) hypertension: Secondary | ICD-10-CM

## 2011-02-07 DIAGNOSIS — F411 Generalized anxiety disorder: Secondary | ICD-10-CM

## 2011-02-07 DIAGNOSIS — R300 Dysuria: Secondary | ICD-10-CM

## 2011-02-07 LAB — POCT URINALYSIS DIPSTICK
Glucose, UA: NEGATIVE
Leukocytes, UA: NEGATIVE
Nitrite, UA: NEGATIVE
Urobilinogen, UA: 0.2

## 2011-02-07 MED ORDER — SERTRALINE HCL 25 MG PO TABS: 25.0000 mg | ORAL_TABLET | Freq: Every day | ORAL | Status: DC

## 2011-02-07 NOTE — Telephone Encounter (Signed)
ua order given

## 2011-02-07 NOTE — Assessment & Plan Note (Signed)
Increased stress over divorce

## 2011-02-09 LAB — URINE CULTURE: Colony Count: 9000

## 2011-02-22 NOTE — Progress Notes (Signed)
  Subjective:    Patient ID: Amanda Strong, female    DOB: 04-11-1981, 30 y.o.   MRN: [id number]  Hypertension Pertinent negatives include no headaches, neck pain or shortness of breath.   Patient presents for followup of hypertension.  She's been compliant with her medications her blood pressures have generally been normal at home states that she is controlling her depression  She has a history of anxiety with depression  Also recently was treated for chronic vaginitis and pelvic pain there is no evidence of a urinary tract infection She states that she has not been sexually active   Review of Systems  Constitutional: Negative for activity change, appetite change and fatigue.  HENT: Negative for ear pain, congestion, neck pain, postnasal drip and sinus pressure.   Eyes: Negative for redness and visual disturbance.  Respiratory: Negative for cough, shortness of breath and wheezing.   Gastrointestinal: Negative for abdominal pain and abdominal distention.  Genitourinary: Negative for dysuria, frequency and menstrual problem.  Musculoskeletal: Negative for myalgias, joint swelling and arthralgias.  Skin: Negative for rash and wound.  Neurological: Negative for dizziness, weakness and headaches.  Hematological: Negative for adenopathy. Does not bruise/bleed easily.  Psychiatric/Behavioral: Negative for sleep disturbance and decreased concentration.   Past Medical History  Diagnosis Date  . Acne   . Hypertension   . Allergy    Past Surgical History  Procedure Date  . Wisdom tooth extraction     reports that she has never smoked. She does not have any smokeless tobacco history on file. She reports that she drinks alcohol. She reports that she does not use illicit drugs. family history includes Hypertension in her father and mother. Allergies  Allergen Reactions  . Cephalexin     REACTION: intense itching all over       Objective:   Physical Exam  Constitutional: She  is oriented to person, place, and time. She appears well-developed and well-nourished. No distress.  HENT:  Head: Normocephalic and atraumatic.  Right Ear: External ear normal.  Left Ear: External ear normal.  Nose: Nose normal.  Mouth/Throat: Oropharynx is clear and moist.  Eyes: Conjunctivae and EOM are normal. Pupils are equal, round, and reactive to light.  Neck: Normal range of motion. Neck supple. No JVD present. No tracheal deviation present. No thyromegaly present.  Cardiovascular: Normal rate, regular rhythm, normal heart sounds and intact distal pulses.   No murmur heard. Pulmonary/Chest: Effort normal and breath sounds normal. She has no wheezes. She exhibits no tenderness.  Abdominal: Soft. Bowel sounds are normal.  Musculoskeletal: Normal range of motion. She exhibits no edema and no tenderness.  Lymphadenopathy:    She has no cervical adenopathy.  Neurological: [name] alert and oriented to person, place, and time. She has normal reflexes. No cranial nerve deficit.  Skin: Skin is warm and dry. [name] not diaphoretic.  Psychiatric: She has a normal mood and affect. Her behavior is normal.          Assessment & Plan:  Patient screen for urinary tract infection the dip urine was negative a urine culture will be sent he is no pelvic discharge I am wondering if bladder spasm plays a role in her pain we will consider an antispasmodic if cultures negative may benefit from referral to urology to see if interstitial cystis is present  Her blood pressures will

## 2011-03-24 ENCOUNTER — Other Ambulatory Visit: Payer: Self-pay | Admitting: *Deleted

## 2011-03-24 DIAGNOSIS — F411 Generalized anxiety disorder: Secondary | ICD-10-CM

## 2011-03-24 MED ORDER — CLONAZEPAM 0.5 MG PO TABS: 0.5000 mg | ORAL_TABLET | Freq: Two times a day (BID) | ORAL | Status: DC

## 2011-04-18 ENCOUNTER — Ambulatory Visit: Payer: BC Managed Care – PPO | Admitting: Internal Medicine

## 2011-04-22 ENCOUNTER — Encounter: Payer: Self-pay | Admitting: Internal Medicine

## 2011-04-22 ENCOUNTER — Ambulatory Visit (INDEPENDENT_AMBULATORY_CARE_PROVIDER_SITE_OTHER): Payer: BC Managed Care – PPO | Admitting: Internal Medicine

## 2011-04-22 DIAGNOSIS — I10 Essential (primary) hypertension: Secondary | ICD-10-CM

## 2011-04-22 DIAGNOSIS — F3289 Other specified depressive episodes: Secondary | ICD-10-CM

## 2011-04-22 DIAGNOSIS — F411 Generalized anxiety disorder: Secondary | ICD-10-CM

## 2011-04-22 DIAGNOSIS — F329 Major depressive disorder, single episode, unspecified: Secondary | ICD-10-CM

## 2011-04-22 MED ORDER — CLONAZEPAM 0.5 MG PO TABS: 0.5000 mg | ORAL_TABLET | Freq: Two times a day (BID) | ORAL | Status: DC

## 2011-04-22 MED ORDER — PHENTERMINE HCL 37.5 MG PO CAPS: 37.5000 mg | ORAL_CAPSULE | ORAL | Status: DC

## 2011-04-22 NOTE — Progress Notes (Signed)
  Subjective:    Patient ID: Amanda Strong, female    DOB: 1981-04-07, 30 y.o.   MRN: [id number]  HPI The pt stopped the zoloft due to fatigue abd does not want to try other medications The clonopin takes the age off the anxiety   Review of Systems  Constitutional: Negative for activity change, appetite change and fatigue.  HENT: Negative for ear pain, congestion, neck pain, postnasal drip and sinus pressure.   Eyes: Negative for redness and visual disturbance.  Respiratory: Negative for cough, shortness of breath and wheezing.   Gastrointestinal: Negative for abdominal pain and abdominal distention.  Genitourinary: Negative for dysuria, frequency and menstrual problem.  Musculoskeletal: Negative for myalgias, joint swelling and arthralgias.  Skin: Negative for rash and wound.  Neurological: Negative for dizziness, weakness and headaches.  Hematological: Negative for adenopathy. Does not bruise/bleed easily.  Psychiatric/Behavioral: Negative for sleep disturbance and decreased concentration.       Objective:   Physical Exam  Vitals reviewed. Constitutional: [name] oriented to person, place, and time. She appears well-developed and well-nourished. No distress.  HENT:  Head: Normocephalic and atraumatic.  Right Ear: External ear normal.  Left Ear: External ear normal.  Nose: Nose normal.  Mouth/Throat: Oropharynx is clear and moist.  Eyes: Conjunctivae and EOM are normal. Pupils are equal, round, and reactive to light.  Neck: Normal range of motion. Neck supple. No JVD present. No tracheal deviation present. No thyromegaly present.  Cardiovascular: Normal rate, regular rhythm, normal heart sounds and intact distal pulses.   No murmur heard. Pulmonary/Chest: Effort normal and breath sounds normal. She has no wheezes. She exhibits no tenderness.  Abdominal: Soft. Bowel sounds are normal.  Musculoskeletal: Normal range of motion. She exhibits no edema and no tenderness.    Lymphadenopathy:    She has no cervical adenopathy.  Neurological: [name] alert and oriented to person, place, and time. She has normal reflexes. No cranial nerve deficit.  Skin: Skin is warm and dry. [name] not diaphoretic.  Psychiatric: She has a normal mood and affect. Her behavior is normal.          Assessment & Plan:  Chronic depression and anxiety with a history of noncompliance stopping medications We'll prescribe Klonopin 0.5 mg twice a day and recommended the patient continue to seek psychological assistance.

## 2011-04-22 NOTE — Patient Instructions (Signed)
Wellbutrin  Look up this medication

## 2011-04-26 ENCOUNTER — Other Ambulatory Visit: Payer: Self-pay | Admitting: Gynecology

## 2011-04-26 DIAGNOSIS — Z1231 Encounter for screening mammogram for malignant neoplasm of breast: Secondary | ICD-10-CM

## 2011-05-13 ENCOUNTER — Other Ambulatory Visit: Payer: Self-pay | Admitting: Gynecology

## 2011-05-17 ENCOUNTER — Telehealth: Payer: Self-pay | Admitting: Internal Medicine

## 2011-05-17 NOTE — Telephone Encounter (Signed)
Message on triage line. Pt states that since Friday, whenever she eats/drinks anything, it feels like a lump, like she ahas "to force the food down" her throat. States he throat does not hurt, and it does not really hurt to swallow, but that something is not right. Wondering if it could be acid reflux? Please call.

## 2011-05-18 NOTE — Telephone Encounter (Signed)
Please advise 

## 2011-05-19 NOTE — Telephone Encounter (Signed)
Pt is calling back again with the same symptoms.

## 2011-05-19 NOTE — Telephone Encounter (Signed)
Per dr Lovell Sheehan- try some prilosec otc

## 2011-05-19 NOTE — Telephone Encounter (Signed)
Notified pt. 

## 2011-05-26 ENCOUNTER — Other Ambulatory Visit: Payer: Self-pay | Admitting: Gynecology

## 2011-06-02 ENCOUNTER — Ambulatory Visit
Admission: RE | Admit: 2011-06-02 | Discharge: 2011-06-02 | Disposition: A | Discharge status: Home / Self Care | Payer: BC Managed Care – PPO | Source: Ambulatory Visit | Attending: Gynecology | Admitting: Gynecology

## 2011-06-02 DIAGNOSIS — Z1231 Encounter for screening mammogram for malignant neoplasm of breast: Secondary | ICD-10-CM

## 2011-07-22 ENCOUNTER — Telehealth: Payer: Self-pay | Admitting: *Deleted

## 2011-07-22 MED ORDER — HYDROCHLOROTHIAZIDE 12.5 MG PO CAPS: ORAL_CAPSULE | ORAL | Status: DC

## 2011-07-22 NOTE — Telephone Encounter (Signed)
Notified pt., and she will call with OB's opinion.

## 2011-07-22 NOTE — Telephone Encounter (Signed)
Per dr Lovell Sheehan- she needs to stop all her current meds-- needs to start hctz 12.5 qd- and that is all she can take

## 2011-07-22 NOTE — Telephone Encounter (Signed)
OB wants Labetalol, and pt does not to use this.  Then, the OB said no meds until regular OB is back in town, and pt refuses to go without meds.

## 2011-07-22 NOTE — Telephone Encounter (Signed)
Per dr Lovell Sheehan- may take hctz 12.5 until regul ob returns-p[t informed and med sent in

## 2011-07-22 NOTE — Telephone Encounter (Addendum)
Pt is currently pregnant and needs to know what to do about the meds???  OB wants pt to take Labetalol but pt does not want to.  Now, the OB tells her not to take anything until her regular OB  Gets back in town. Pt does NOT want to go without meds.

## 2011-07-26 ENCOUNTER — Ambulatory Visit: Payer: BC Managed Care – PPO | Admitting: Internal Medicine

## 2011-08-03 ENCOUNTER — Other Ambulatory Visit (HOSPITAL_COMMUNITY): Payer: Self-pay | Admitting: Obstetrics and Gynecology

## 2011-08-03 ENCOUNTER — Ambulatory Visit (HOSPITAL_COMMUNITY)
Admission: RE | Admit: 2011-08-03 | Discharge: 2011-08-03 | Disposition: A | Discharge status: Home / Self Care | Payer: BC Managed Care – PPO | Source: Ambulatory Visit | Attending: Obstetrics and Gynecology | Admitting: Obstetrics and Gynecology

## 2011-08-03 DIAGNOSIS — O26859 Spotting complicating pregnancy, unspecified trimester: Secondary | ICD-10-CM

## 2011-08-03 DIAGNOSIS — Z3689 Encounter for other specified antenatal screening: Secondary | ICD-10-CM | POA: Insufficient documentation

## 2011-08-03 DIAGNOSIS — O209 Hemorrhage in early pregnancy, unspecified: Secondary | ICD-10-CM | POA: Insufficient documentation

## 2011-08-15 ENCOUNTER — Telehealth: Payer: Self-pay | Admitting: Family Medicine

## 2011-08-15 ENCOUNTER — Ambulatory Visit: Payer: BC Managed Care – PPO | Admitting: Internal Medicine

## 2011-08-15 NOTE — Telephone Encounter (Signed)
Office Message from Date: 08/13/2011 12:00:00 AM Time of Call: [phone] Faxed To: [facility] Caller: Sheylin Fax Number: [phone] Facility: N/A Patient: Amanda Strong, Amanda Strong DOB: May 18, 1981 Phone: [phone] Provider: Darryll Capers Message: See info below Regarding Appointment: Yes Appt Date: 08/15/2011 Appt Time: 2:30:00 PM Provider: Darryll Capers Reason: Details: Pt thought this appt was already cancelled but received reminder call. Please cancel. Outcome: Instructed patient to call back on the next business day. Message Taken by: Annalee Genta, CSR

## 2011-08-24 ENCOUNTER — Telehealth: Payer: Self-pay | Admitting: Internal Medicine

## 2011-08-24 NOTE — Telephone Encounter (Signed)
Pt was on blood pressure meds and found out she was pregnant and stopped taking them. Pt miscarried and is wanting to get back on them. Pt is scheduled for 10/14/11 but is concerned about waiting that long to get back on it. Pt requesting you contact her

## 2011-08-24 NOTE — Telephone Encounter (Signed)
May go back on                bp med she was on( I think it was benicar)

## 2011-08-24 NOTE — Telephone Encounter (Signed)
Left a detailed message for pt

## 2011-08-24 NOTE — Telephone Encounter (Signed)
ok 

## 2011-09-27 ENCOUNTER — Other Ambulatory Visit: Payer: Self-pay | Admitting: Gynecology

## 2011-10-14 ENCOUNTER — Ambulatory Visit: Payer: BC Managed Care – PPO | Admitting: Internal Medicine

## 2011-12-19 ENCOUNTER — Telehealth: Payer: Self-pay | Admitting: *Deleted

## 2011-12-19 DIAGNOSIS — F411 Generalized anxiety disorder: Secondary | ICD-10-CM

## 2011-12-19 MED ORDER — CLONAZEPAM 0.5 MG PO TABS: 0.5000 mg | ORAL_TABLET | Freq: Two times a day (BID) | ORAL | Status: DC

## 2011-12-19 NOTE — Telephone Encounter (Signed)
Pt is asking for Klonopin for anxiety and panic attacks [phone].  Will make appt if needs to.

## 2011-12-19 NOTE — Telephone Encounter (Signed)
Called into pharmacy

## 2011-12-26 ENCOUNTER — Telehealth: Payer: Self-pay | Admitting: Internal Medicine

## 2011-12-26 NOTE — Telephone Encounter (Signed)
Left message on machine For mom- really should have pt to call, but dr Lovell Sheehan suggest she has ov with him to discuss any changes or increase in medication

## 2011-12-26 NOTE — Telephone Encounter (Signed)
[name] mom called stating that patient is having some issues at work and she feels she need an increase on her clonipin and maybe a new rx for an antidepressant. If mom can not be reached contact her supervisor Bernestine Amass at [phone]. Please advise.

## 2012-01-02 ENCOUNTER — Ambulatory Visit (INDEPENDENT_AMBULATORY_CARE_PROVIDER_SITE_OTHER): Payer: BC Managed Care – PPO | Admitting: Family

## 2012-01-02 ENCOUNTER — Encounter: Payer: Self-pay | Admitting: Family

## 2012-01-02 ENCOUNTER — Telehealth: Payer: Self-pay

## 2012-01-02 VITALS — BP 120/80 | Temp 98.6°F | Wt 130.0 lb

## 2012-01-02 DIAGNOSIS — F418 Other specified anxiety disorders: Secondary | ICD-10-CM

## 2012-01-02 DIAGNOSIS — F341 Dysthymic disorder: Secondary | ICD-10-CM

## 2012-01-02 DIAGNOSIS — E0590 Thyrotoxicosis, unspecified without thyrotoxic crisis or storm: Secondary | ICD-10-CM

## 2012-01-02 MED ORDER — DESVENLAFAXINE SUCCINATE ER 50 MG PO TB24: 50.0000 mg | ORAL_TABLET | Freq: Every day | ORAL | Status: DC

## 2012-01-02 NOTE — Patient Instructions (Signed)

## 2012-01-02 NOTE — Telephone Encounter (Signed)
Pt aware and referral placed.  

## 2012-01-02 NOTE — Telephone Encounter (Signed)
Message copied by Beverely Low on Mon Jan 02, 2012  4:29 PM ------      Message from: Adline Mango B      Created: Mon Jan 02, 2012  3:55 PM       Overactive thyroid. Could be partly responsible for her symptoms.  Refer to Endocrinology. May hold antidepressant meds until she sees them.

## 2012-01-02 NOTE — Progress Notes (Signed)
Subjective:    Patient ID: Amanda Strong, female    DOB: [birth year], 31 y.o.   MRN: [id number]  HPI 31 year old black female, nonsmoker, patient of Dr. Lovell Sheehan exam with complaints of depression. She's been treated for anxiety in the past but has never felt "clinically depressed." She has episodes of crying, anger, temperamental, and sadness. She has had feelings of helplessness and hopelessness. He denies any thoughts of death or dying, no thoughts of suicide. In the past she has taken Zoloft and Cymbalta but have not been effective. She's currently taking Klonopin 0.5 mg twice a day as needed. The Klonopin is not sufficient.   Review of Systems  Constitutional: Negative.   Respiratory: Negative.   Cardiovascular: Negative.   Neurological: Negative.   Hematological: Negative.   Psychiatric/Behavioral: Positive for confusion and agitation. The patient is nervous/anxious.    Past Medical History  Diagnosis Date  . Acne   . Hypertension   . Allergy     History   Social History  . Marital Status: Single    Spouse Name: N/A    Number of Children: N/A  . Years of Education: N/A   Occupational History  . Not on file.   Social History Main Topics  . Smoking status: Never Smoker   . Smokeless tobacco: Not on file  . Alcohol Use: Yes     occasionally  . Drug Use: No  . Sexually Active: Not Currently   Other Topics Concern  . Not on file   Social History Narrative  . No narrative on file    Past Surgical History  Procedure Date  . Wisdom tooth extraction     Family History  Problem Relation Age of Onset  . Hypertension Mother   . Hypertension Father     Allergies  Allergen Reactions  . Cephalexin     REACTION: intense itching all over    Current Outpatient Prescriptions on File Prior to Visit  Medication Sig Dispense Refill  . clonazePAM (KLONOPIN) 0.5 MG tablet Take 1 tablet (0.5 mg total) by mouth 2 (two) times daily.  60 tablet  1  .  hydrochlorothiazide (MICROZIDE) 12.5 MG capsule Use until ob md returns and gives her further instructions  5 capsule  1  . desvenlafaxine (PRISTIQ) 50 MG 24 hr tablet Take 1 tablet (50 mg total) by mouth daily.  21 tablet  0  . drospirenone-ethinyl estradiol (YAZ) 3-0.02 MG per tablet Take 1 tablet by mouth daily.        Marland Kitchen olmesartan-hydrochlorothiazide (BENICAR HCT) 20-12.5 MG per tablet Take 1 tablet by mouth daily.        . phentermine 37.5 MG capsule Take 1 capsule (37.5 mg total) by mouth every morning.  30 capsule  11    BP 120/80  Temp(Src) 98.6 F (37 C) (Oral)  Wt 130 lb (58.968 kg)chart    Objective:   Physical Exam  Constitutional: [name] oriented to person, place, and time. She appears well-developed and well-nourished.  Neck: Normal range of motion. Neck supple.  Cardiovascular: Normal rate and normal heart sounds.   Pulmonary/Chest: Effort normal and breath sounds normal.  Neurological: [name] alert and oriented to person, place, and time.  Skin: Skin is warm and dry.  Psychiatric: She has a normal mood and affect.          Assessment & Plan:  Assessment: Depression, anxiety  Plan: Discussed treatment options. Consider psychotherapy. Start to Pristiq 50 mg once daily. Continue Klonopin when  necessary. Rest. Exercise daily. We'll follow up patient in 2-1/2 weeks and sooner when necessary.

## 2012-01-16 ENCOUNTER — Ambulatory Visit: Payer: BC Managed Care – PPO | Admitting: Family

## 2012-01-16 DIAGNOSIS — Z0289 Encounter for other administrative examinations: Secondary | ICD-10-CM

## 2012-01-23 ENCOUNTER — Encounter: Payer: Self-pay | Admitting: Endocrinology

## 2012-01-23 ENCOUNTER — Other Ambulatory Visit (INDEPENDENT_AMBULATORY_CARE_PROVIDER_SITE_OTHER): Payer: BC Managed Care – PPO

## 2012-01-23 ENCOUNTER — Telehealth: Payer: Self-pay | Admitting: *Deleted

## 2012-01-23 ENCOUNTER — Ambulatory Visit (INDEPENDENT_AMBULATORY_CARE_PROVIDER_SITE_OTHER): Payer: BC Managed Care – PPO | Admitting: Endocrinology

## 2012-01-23 VITALS — BP 122/84 | HR 98 | Temp 98.4°F | Ht 67.0 in | Wt 129.0 lb

## 2012-01-23 DIAGNOSIS — R7989 Other specified abnormal findings of blood chemistry: Secondary | ICD-10-CM | POA: Insufficient documentation

## 2012-01-23 DIAGNOSIS — R6889 Other general symptoms and signs: Secondary | ICD-10-CM

## 2012-01-23 LAB — T4, FREE: Free T4: 0.89 ng/dL (ref 0.60–1.60)

## 2012-01-23 NOTE — Telephone Encounter (Signed)
Called pt to inform of thyroid lab results, pt informed of lab results via VM and to callback office with any questions/concerns  (letter also mailed to pt).

## 2012-01-23 NOTE — Progress Notes (Signed)
Subjective:    Patient ID: Amanda Strong, female    DOB: 16-Apr-1981, 31 y.o.   MRN: [id number]  HPI Pt has a few weeks of moderate palpitations in the chest, and assoc severe anxiety.  She says she has had these sxs in the past, but they are worse this time.  She has never had a thyroid problem in the past.  She had miscarriage 6 mos ago.  She did not resume OC's afterwards.   Past Medical History  Diagnosis Date  . Acne   . Hypertension   . Allergy   . Depression   . Hyperthyroidism   . History of chicken pox     Past Surgical History  Procedure Date  . Wisdom tooth extraction     History   Social History  . Marital Status: Single    Spouse Name: N/A    Number of Children: 1  . Years of Education: 16   Occupational History  . WOUND CARE NURSES [employer]   Social History Main Topics  . Smoking status: Former Games developer  . Smokeless tobacco: Not on file  . Alcohol Use: Yes     occasionally  . Drug Use: No  . Sexually Active: Not Currently   Other Topics Concern  . Not on file   Social History Narrative   Caffeine Use-yesRegular exercise-no    Current Outpatient Prescriptions on File Prior to Visit  Medication Sig Dispense Refill  . clonazePAM (KLONOPIN) 0.5 MG tablet Take 1 tablet (0.5 mg total) by mouth 2 (two) times daily.  60 tablet  1  . desvenlafaxine (PRISTIQ) 50 MG 24 hr tablet Take 1 tablet (50 mg total) by mouth daily.  21 tablet  0  . methyldopa (ALDOMET) 250 MG tablet Take 250 mg by mouth 2 (two) times daily.       Marland Kitchen olmesartan-hydrochlorothiazide (BENICAR HCT) 20-12.5 MG per tablet Take 1 tablet by mouth daily.          Allergies  Allergen Reactions  . Cephalexin     REACTION: intense itching all over  . Percocet (Oxycodone-Acetaminophen)     Family History  Problem Relation Age of Onset  . Hypertension Mother   . Hypertension Father   . Cancer Other     Breast Cancer-Parent  . Stroke Other     Grandparents, Other  Blood Relative  . Hyperlipidemia Other     Family History (Parents, Grandparents, Other Relatives)  grandmother has uncertain type of thyroid problem  BP 122/84  Pulse 98  Temp(Src) 98.4 F (36.9 C) (Oral)  Ht 5\' 7"  (1.702 m)  Wt 129 lb (58.514 kg)  BMI 20.20 kg/m2  SpO2 99%  LMP 01/09/2012  Review of Systems denies headache, hoarseness, double vision, diarrhea, polyuria, excessive diaphoresis, numbness, swelling of the hands, belching, and rhinorrhea.  She has tremor, 15-lb weight loss, easy bruising, myalgias, insomnia, fatigue, and doe.  LMP was 01/09/12.  She has long menses, and a short interval between menses.      Objective:   Physical Exam VS: see vs page GEN: no distress HEAD: head: no deformity eyes: no periorbital swelling, no proptosis external nose and ears are normal mouth: no lesion seen NECK: supple, thyroid is not enlarged. CHEST WALL: no deformity LUNGS:  Clear to auscultation CV: reg rate and rhythm, no murmur ABD: abdomen is soft, nontender.  no hepatosplenomegaly.  not distended.  no hernia MUSCULOSKELETAL: muscle bulk and strength are grossly normal.  no obvious joint  swelling.  gait is normal and steady EXTEMITIES: no deformity of the hands.  no edema of the legs. PULSES: dorsalis pedis intact bilat.  no carotid bruit NEURO:  cn 2-12 grossly intact.   readily moves all 4's.  sensation is intact to touch on the feet.  No tremor SKIN:  Normal texture and temperature.  No rash or suspicious lesion is visible.  Not diaphoretic. NODES:  None palpable at the neck PSYCH: alert, oriented x3.  Does not appear anxious nor depressed.  Lab Results  Component Value Date   TSH 0.47 01/23/2012      Assessment & Plan:  Mild hyperthyroidism, resolved.  uncertain etiology.  [name] at risk for recurrence. Menstrual abnormality, not thyroid-related Weight-loss, not thyroid-related

## 2012-01-23 NOTE — Patient Instructions (Addendum)
blood tests are being requested for you today.  You will receive a letter with results. Cc dr Nicholas Lose and richardson (update:  Recheck TSH in 1-2 mos)

## 2012-01-24 ENCOUNTER — Telehealth: Payer: Self-pay | Admitting: *Deleted

## 2012-01-24 NOTE — Telephone Encounter (Signed)
Left message for pt to call Amanda Strong to make appt with Judithe Modest.

## 2012-01-24 NOTE — Telephone Encounter (Signed)
Pt was sent to a specialist for thyroid testing, and her testing was normal.  Needs follow up to see someone else but does not know who to see???  Please give pt some directions on which doctors to see.  Leave a detailed message.  [phone] The thyroid doctor told her to come back in one month for repeat testing.

## 2012-01-24 NOTE — Telephone Encounter (Signed)
Per dr Lovell Sheehan- thyroid is not causing anxiety- per dr Lovell Sheehan- she needs to see susan bond to discuss anxiety

## 2012-01-26 ENCOUNTER — Ambulatory Visit (INDEPENDENT_AMBULATORY_CARE_PROVIDER_SITE_OTHER): Payer: BC Managed Care – PPO | Admitting: Licensed Clinical Social Worker

## 2012-01-26 DIAGNOSIS — F4323 Adjustment disorder with mixed anxiety and depressed mood: Secondary | ICD-10-CM

## 2012-03-08 ENCOUNTER — Telehealth: Payer: Self-pay | Admitting: Internal Medicine

## 2012-03-08 DIAGNOSIS — F411 Generalized anxiety disorder: Secondary | ICD-10-CM

## 2012-03-08 MED ORDER — CLONAZEPAM 0.5 MG PO TABS: 0.5000 mg | ORAL_TABLET | Freq: Two times a day (BID) | ORAL | Status: DC

## 2012-03-08 MED ORDER — METHYLDOPA 250 MG PO TABS: 250.0000 mg | ORAL_TABLET | Freq: Two times a day (BID) | ORAL | Status: DC

## 2012-03-08 NOTE — Telephone Encounter (Signed)
Ok to refill per dr Lovell Sheehan . lmom

## 2012-03-08 NOTE — Telephone Encounter (Signed)
Pt states she has contacted pharmacy but wants to send a message as well that she needs Klonopin refill. She also wants to know if Dr. Lovell Sheehan will take over prescribing her Methyldopa rx. She takes 250mg  1 po BID. [name] not pregnant anymore and OB wants PCP to take over now. She states it works well and she does not want to switch to another BP medicine. She uses [facility] [address]

## 2012-03-27 LAB — OB RESULTS CONSOLE RUBELLA ANTIBODY, IGM: Rubella: IMMUNE

## 2012-03-27 LAB — OB RESULTS CONSOLE GBS: GBS: POSITIVE

## 2012-03-27 LAB — OB RESULTS CONSOLE ABO/RH: RH Type: POSITIVE

## 2012-03-27 LAB — OB RESULTS CONSOLE RPR: RPR: NONREACTIVE

## 2012-03-27 LAB — OB RESULTS CONSOLE HIV ANTIBODY (ROUTINE TESTING): HIV: NONREACTIVE

## 2012-04-18 ENCOUNTER — Emergency Department (HOSPITAL_COMMUNITY)
Admission: EM | Admit: 2012-04-18 | Discharge: 2012-04-18 | Disposition: A | Discharge status: Home / Self Care | Payer: BC Managed Care – PPO | Source: Home / Self Care | Attending: Emergency Medicine | Admitting: Emergency Medicine

## 2012-04-18 ENCOUNTER — Encounter (HOSPITAL_COMMUNITY): Payer: Self-pay

## 2012-04-18 DIAGNOSIS — J020 Streptococcal pharyngitis: Secondary | ICD-10-CM

## 2012-04-18 MED ORDER — AMOXICILLIN 875 MG PO TABS: 875.0000 mg | ORAL_TABLET | Freq: Two times a day (BID) | ORAL | Status: AC

## 2012-04-18 NOTE — ED Notes (Signed)
C/o sore throat, headache and swollen lymph nodes on lt since this morning.  States she did not feel well this am.

## 2012-04-18 NOTE — ED Provider Notes (Signed)
History     CSN: [id number]  Arrival date & time 04/18/12  1647   First MD Initiated Contact with Patient 04/18/12 1657      Chief Complaint  Patient presents with  . Sore Throat    (Consider location/radiation/quality/duration/timing/severity/associated sxs/prior treatment) HPI Comments: Patient is pregnant, 12 weeks 5 days by ultrasound, who reports headache, malaise since starting yesterday. This morning woke up with left-sided sore throat and tonsillar exudates. Pain is worse with swallowing. She's been taking Tylenol with mild relief.No nausea, vomiting, fevers, drooling, trismus, voice changes, sensation of throat swelling shut. No reflux symptoms, cough, abdominal pain. No rash. Son has URI like symptoms, but no known contacts with strep throat.   ROS as noted in HPI. All other ROS negative.   Patient is a 31 y.o. female presenting with pharyngitis.  Sore Throat    Past Medical History  Diagnosis Date  . Acne   . Hypertension   . Allergy   . Depression   . Hyperthyroidism   . History of chicken pox     Past Surgical History  Procedure Date  . Wisdom tooth extraction   . Bunionectomy     Family History  Problem Relation Age of Onset  . Hypertension Mother   . Hypertension Father   . Cancer Other     Breast Cancer-Parent  . Stroke Other     Grandparents, Other Blood Relative  . Hyperlipidemia Other     Family History (Parents, Grandparents, Other Relatives)    History  Substance Use Topics  . Smoking status: Former Games developer  . Smokeless tobacco: Not on file  . Alcohol Use: No     occasionally    OB History    Grav Para Term Preterm Abortions TAB SAB Ect Mult Living   1               Review of Systems  Allergies  Cephalexin and Percocet  Home Medications   Current Outpatient Rx  Name Route Sig Dispense Refill  . METHYLDOPA 250 MG PO TABS Oral Take 1 tablet (250 mg total) by mouth 2 (two) times daily. 60 tablet 5  . PRENATAL VITAMINS (DIS)  PO TABS Oral Take 1 tablet by mouth daily.    . AMOXICILLIN 875 MG PO TABS Oral Take 1 tablet (875 mg total) by mouth 2 (two) times daily. X 10 days 20 tablet 0    BP 138/89  Pulse 90  Temp 99.1 F (37.3 C) (Oral)  Resp 18  SpO2 100%  LMP 02/04/2012  Physical Exam  Nursing note and vitals reviewed. Constitutional: [name] oriented to person, place, and time. She appears well-developed and well-nourished. No distress.  HENT:  Head: Normocephalic and atraumatic. No trismus in the jaw.  Right Ear: Tympanic membrane normal.  Left Ear: Tympanic membrane normal.  Nose: Nose normal. Right sinus exhibits no maxillary sinus tenderness and no frontal sinus tenderness. Left sinus exhibits no maxillary sinus tenderness and no frontal sinus tenderness.  Mouth/Throat: Uvula is midline and mucous membranes are normal. No tonsillar abscesses.       Erythematous, swollen left tonsil with extensive exudates.  Eyes: Conjunctivae and EOM are normal.  Neck: Normal range of motion.  Cardiovascular: Normal rate.   Pulmonary/Chest: Effort normal.  Abdominal: She exhibits no distension.  Musculoskeletal: Normal range of motion.  Lymphadenopathy:    She has cervical adenopathy.  Neurological: [name] alert and oriented to person, place, and time. Coordination normal.  Skin: Skin is  warm and dry. No rash noted.  Psychiatric: She has a normal mood and affect. Her behavior is normal. Judgment and thought content normal.    ED Course  Procedures (including critical care time)  Labs Reviewed  POCT RAPID STREP A ([facility] ONLY) - Abnormal; Notable for the following:    Streptococcus, Group A Screen (Direct) POSITIVE (*)     All other components within normal limits   No results found.   1. Strep pharyngitis     MDM  Rapid strep positive. Sending home with amoxicillin  for 10 days. Home with Tylenol. Patient to followup with PMD when necessary     Luiz Blare, MD 04/18/12 [phone]

## 2012-09-19 NOTE — L&D Delivery Note (Signed)
Delivery Note At 5:01 PM a viable and healthy female was delivered via Vaginal, Spontaneous Delivery (Presentation: OA ; LOT  ).  APGAR: 8, 9 ; weight P.   Placenta status: Intact, Spontaneous.  Cord: 3 vessels with the following complications: Nuchal  Anesthesia: Epidural  Episiotomy: None Lacerations: Periurethral;Labial Suture Repair: 3.0 vicryl rapide Est. Blood Loss (mL):   Mom to postpartum.  Baby to stay with mommy/daddy.  BOVARD,Mychele Seyller 10/29/2012, 5:21 PM  Br/B+/RI/ BTL - PP vs [facility]

## 2012-10-22 ENCOUNTER — Telehealth (HOSPITAL_COMMUNITY): Payer: Self-pay | Admitting: *Deleted

## 2012-10-22 ENCOUNTER — Encounter (HOSPITAL_COMMUNITY): Payer: Self-pay | Admitting: *Deleted

## 2012-10-22 NOTE — Telephone Encounter (Signed)
Preadmission screen  

## 2012-10-26 ENCOUNTER — Inpatient Hospital Stay (HOSPITAL_COMMUNITY): Admission: AD | Admit: 2012-10-26 | Payer: BC Managed Care – PPO | Source: Ambulatory Visit | Admitting: Pediatrics

## 2012-10-28 ENCOUNTER — Encounter (HOSPITAL_COMMUNITY): Payer: Self-pay

## 2012-10-28 DIAGNOSIS — O98819 Other maternal infectious and parasitic diseases complicating pregnancy, unspecified trimester: Secondary | ICD-10-CM

## 2012-10-28 DIAGNOSIS — B951 Streptococcus, group B, as the cause of diseases classified elsewhere: Secondary | ICD-10-CM

## 2012-10-28 DIAGNOSIS — Z3480 Encounter for supervision of other normal pregnancy, unspecified trimester: Secondary | ICD-10-CM

## 2012-10-28 HISTORY — DX: Streptococcus, group b, as the cause of diseases classified elsewhere: B95.1

## 2012-10-28 HISTORY — DX: Encounter for supervision of other normal pregnancy, unspecified trimester: Z34.80

## 2012-10-28 MED ORDER — PRENATAL MULTIVITAMIN CH: 1.0000 | ORAL_TABLET | Freq: Every day | ORAL | Status: DC

## 2012-10-28 MED ORDER — METHYLDOPA 250 MG PO TABS
250.0000 mg | ORAL_TABLET | Freq: Two times a day (BID) | ORAL | Status: DC
  Filled 2012-10-28 (×4): qty 1

## 2012-10-28 NOTE — H&P (Signed)
Amanda Strong is a 32 y.o. female [id number]@ 40+ for IOL given term staus and favorable cervix.  Pregnancy complicated by + GBBS, also h/o GDM.  Was scheduled for LEEP, but IUP, repeat pap LGSIL, nl colpo in pregnancy.  H/O elevated TSH, nl T4.  Also h/o HTN, has had aldomet in pregnancy. Maternal Medical History:  Contractions: Frequency: irregular.   Perceived severity is moderate.    Fetal activity: Perceived fetal activity is normal.      OB History   Grav Para Term Preterm Abortions TAB SAB Ect Mult Living   3 1 1  1  1   1     G1 SVD, 41wk, female, PIH; G2 SAB, G3 present; + Abn pap - LGSIL in preg, h/o Chl Past Medical History  Diagnosis Date  . Acne   . Hypertension   . Allergy   . Depression   . Hyperthyroidism   . History of chicken pox   . Abnormal Pap smear   . Normal pregnancy, repeat 10/28/2012   Past Surgical History  Procedure Laterality Date  . Wisdom tooth extraction    . Bunionectomy    . Foot surgery     Family History: family history includes Cancer in her mother; Heart disease in her paternal grandmother; Hypertension in her father, maternal aunt, maternal grandmother, maternal uncle, mother, paternal aunt, paternal grandfather, paternal grandmother, and paternal uncle; and Stroke in her maternal grandfather. Social History:  reports that she quit smoking about 4 years ago. She has never used smokeless tobacco. She reports that she does not drink alcohol or use illicit drugs. BSN Meds Aldomet, PNV,  All Percocet   Prenatal Transfer Tool  Maternal Diabetes: No Genetic Screening: Normal Maternal Ultrasounds/Referrals: Normal Fetal Ultrasounds or other Referrals:  None Maternal Substance Abuse:  No Significant Maternal Medications:  Meds include: Other: Aldomet Significant Maternal Lab Results:  Lab values include: Group B Strep positive Other Comments:  low-lying placenta, resolved  Review of Systems  Constitutional: Negative.   HENT: Negative.    Eyes: Negative.   Respiratory: Negative.   Cardiovascular: Negative.   Gastrointestinal: Negative.   Genitourinary: Negative.   Musculoskeletal: Negative.   Skin: Negative.   Neurological: Negative.   Psychiatric/Behavioral: Negative.       Last menstrual period 01/05/2012. Maternal Exam:  Abdomen: Fundal height is appropriate for gestation.   Estimated fetal weight is 8#.   Fetal presentation: vertex  Introitus: Normal vulva. Normal vagina.  Pelvis: adequate for delivery.   Cervix: Cervix evaluated by digital exam.     Physical Exam  Constitutional: [name] oriented to person, place, and time. She appears well-developed and well-nourished.  HENT:  Head: Normocephalic and atraumatic.  Neck: Normal range of motion. Neck supple.  Cardiovascular: Normal rate and regular rhythm.   Respiratory: Effort normal and breath sounds normal. No respiratory distress.  GI: Soft. Bowel sounds are normal. There is no tenderness.  Musculoskeletal: Normal range of motion.  Neurological: [name] alert and oriented to person, place, and time.  Skin: Skin is warm and dry.  Psychiatric: She has a normal mood and affect. Her behavior is normal.    Prenatal labs: ABO, Rh: B/Positive/-- (07/09 0000) Antibody: Negative (07/09 0000) Rubella: Immune (07/09 0000) RPR: Nonreactive (07/09 0000)  HBsAg: Negative (07/09 0000)  HIV: Non-reactive (07/09 0000)  GBS: Positive (07/09 0000)  Hgb 12.1/ Ur Cx - GBBS+/TSH WNL/Plt 218K/ GC neg/ Chl neg/ CF neg/ First Tri and AFP WNL/ glucola WNL  daed  by 8 wk [place] [facility] 10/26/12  [place] - cwd, nl anat, low lying ant pla/ female  Low-lying placenta resolved   Assessment/Plan: 31yo G3P1011 at 40+ for IOL secondary to favorable cervix and term status Pt doesn't want pitocin - encourage nipple stim gbbs positive - PCN for prophylaxis AROM 4 hr after PCN   BOVARD,Amanda Strong 10/28/2012, 8:33 PM

## 2012-10-29 ENCOUNTER — Encounter (HOSPITAL_COMMUNITY): Payer: Self-pay | Admitting: Anesthesiology

## 2012-10-29 ENCOUNTER — Inpatient Hospital Stay (HOSPITAL_COMMUNITY)
Admission: RE | Admit: 2012-10-29 | Discharge: 2012-10-31 | DRG: 372 | Disposition: A | Discharge status: Home / Self Care | Payer: BC Managed Care – PPO | Source: Ambulatory Visit | Attending: Obstetrics and Gynecology | Admitting: Obstetrics and Gynecology

## 2012-10-29 ENCOUNTER — Encounter (HOSPITAL_COMMUNITY): Payer: Self-pay

## 2012-10-29 ENCOUNTER — Inpatient Hospital Stay (HOSPITAL_COMMUNITY): Payer: BC Managed Care – PPO | Admitting: Anesthesiology

## 2012-10-29 VITALS — BP 130/82 | HR 85 | Temp 97.9°F | Resp 18 | Ht 67.0 in | Wt 197.0 lb

## 2012-10-29 DIAGNOSIS — O99814 Abnormal glucose complicating childbirth: Principal | ICD-10-CM | POA: Diagnosis present

## 2012-10-29 DIAGNOSIS — E0590 Thyrotoxicosis, unspecified without thyrotoxic crisis or storm: Secondary | ICD-10-CM | POA: Diagnosis present

## 2012-10-29 DIAGNOSIS — Z22330 Carrier of Group B streptococcus: Secondary | ICD-10-CM

## 2012-10-29 DIAGNOSIS — O98819 Other maternal infectious and parasitic diseases complicating pregnancy, unspecified trimester: Secondary | ICD-10-CM

## 2012-10-29 DIAGNOSIS — Z3480 Encounter for supervision of other normal pregnancy, unspecified trimester: Secondary | ICD-10-CM

## 2012-10-29 DIAGNOSIS — O1002 Pre-existing essential hypertension complicating childbirth: Secondary | ICD-10-CM | POA: Diagnosis present

## 2012-10-29 DIAGNOSIS — B951 Streptococcus, group B, as the cause of diseases classified elsewhere: Secondary | ICD-10-CM

## 2012-10-29 DIAGNOSIS — O99284 Endocrine, nutritional and metabolic diseases complicating childbirth: Secondary | ICD-10-CM | POA: Diagnosis present

## 2012-10-29 DIAGNOSIS — E079 Disorder of thyroid, unspecified: Secondary | ICD-10-CM | POA: Diagnosis present

## 2012-10-29 DIAGNOSIS — O99892 Other specified diseases and conditions complicating childbirth: Secondary | ICD-10-CM | POA: Diagnosis present

## 2012-10-29 HISTORY — DX: Other maternal infectious and parasitic diseases complicating pregnancy, unspecified trimester: O98.819

## 2012-10-29 HISTORY — DX: Encounter for supervision of other normal pregnancy, unspecified trimester: Z34.80

## 2012-10-29 HISTORY — DX: Streptococcus, group b, as the cause of diseases classified elsewhere: B95.1

## 2012-10-29 LAB — CBC
MCH: 31.9 pg (ref 26.0–34.0)
Platelets: 143 10*3/uL — ABNORMAL LOW (ref 150–400)
RBC: 3.79 MIL/uL — ABNORMAL LOW (ref 3.87–5.11)
RDW: 13.2 % (ref 11.5–15.5)
WBC: 14.1 10*3/uL — ABNORMAL HIGH (ref 4.0–10.5)

## 2012-10-29 LAB — RPR: RPR Ser Ql: NONREACTIVE

## 2012-10-29 MED ORDER — ACETAMINOPHEN 325 MG PO TABS: 650.0000 mg | ORAL_TABLET | ORAL | Status: DC | PRN

## 2012-10-29 MED ORDER — LACTATED RINGERS IV SOLN: INTRAVENOUS | Status: DC

## 2012-10-29 MED ORDER — DIPHENHYDRAMINE HCL 25 MG PO CAPS: 25.0000 mg | ORAL_CAPSULE | Freq: Four times a day (QID) | ORAL | Status: DC | PRN

## 2012-10-29 MED ORDER — OXYCODONE-ACETAMINOPHEN 5-325 MG PO TABS: 1.0000 | ORAL_TABLET | ORAL | Status: DC | PRN

## 2012-10-29 MED ORDER — LACTATED RINGERS IV SOLN: 500.0000 mL | Freq: Once | INTRAVENOUS | Status: DC

## 2012-10-29 MED ORDER — ONDANSETRON HCL 4 MG/2ML IJ SOLN: 4.0000 mg | INTRAMUSCULAR | Status: DC | PRN

## 2012-10-29 MED ORDER — PENICILLIN G POTASSIUM 5000000 UNITS IJ SOLR
2.5000 10*6.[IU] | INTRAVENOUS | Status: DC
  Administered 2012-10-29: 2.5 10*6.[IU] via INTRAVENOUS
  Filled 2012-10-29 (×5): qty 2.5

## 2012-10-29 MED ORDER — OXYTOCIN 40 UNITS IN LACTATED RINGERS INFUSION - SIMPLE MED: 62.5000 mL/h | INTRAVENOUS | Status: DC

## 2012-10-29 MED ORDER — DIBUCAINE 1 % RE OINT: 1.0000 "application " | TOPICAL_OINTMENT | RECTAL | Status: DC | PRN

## 2012-10-29 MED ORDER — EPHEDRINE 5 MG/ML INJ: 10.0000 mg | INTRAVENOUS | Status: DC | PRN

## 2012-10-29 MED ORDER — PENICILLIN G POTASSIUM 5000000 UNITS IJ SOLR
5.0000 10*6.[IU] | Freq: Once | INTRAVENOUS | Status: AC
  Administered 2012-10-29: 5 10*6.[IU] via INTRAVENOUS
  Filled 2012-10-29: qty 5

## 2012-10-29 MED ORDER — BUTORPHANOL TARTRATE 1 MG/ML IJ SOLN
2.0000 mg | INTRAMUSCULAR | Status: DC | PRN
  Administered 2012-10-29: 2 mg via INTRAVENOUS
  Filled 2012-10-29: qty 2

## 2012-10-29 MED ORDER — EPHEDRINE 5 MG/ML INJ
10.0000 mg | INTRAVENOUS | Status: DC | PRN
  Filled 2012-10-29: qty 4

## 2012-10-29 MED ORDER — PRENATAL MULTIVITAMIN CH
1.0000 | ORAL_TABLET | Freq: Every day | ORAL | Status: DC
  Administered 2012-10-30 – 2012-10-31 (×2): 1 via ORAL
  Filled 2012-10-29 (×2): qty 1

## 2012-10-29 MED ORDER — IBUPROFEN 600 MG PO TABS
600.0000 mg | ORAL_TABLET | Freq: Four times a day (QID) | ORAL | Status: DC
  Administered 2012-10-30 – 2012-10-31 (×6): 600 mg via ORAL
  Filled 2012-10-29 (×7): qty 1

## 2012-10-29 MED ORDER — TERBUTALINE SULFATE 1 MG/ML IJ SOLN: 0.2500 mg | Freq: Once | INTRAMUSCULAR | Status: DC | PRN

## 2012-10-29 MED ORDER — SODIUM BICARBONATE 8.4 % IV SOLN
INTRAVENOUS | Status: DC | PRN
  Administered 2012-10-29: 5 mL via EPIDURAL

## 2012-10-29 MED ORDER — OXYTOCIN BOLUS FROM INFUSION: 500.0000 mL | INTRAVENOUS | Status: DC

## 2012-10-29 MED ORDER — SIMETHICONE 80 MG PO CHEW: 80.0000 mg | CHEWABLE_TABLET | ORAL | Status: DC | PRN

## 2012-10-29 MED ORDER — TETANUS-DIPHTH-ACELL PERTUSSIS 5-2.5-18.5 LF-MCG/0.5 IM SUSP: 0.5000 mL | Freq: Once | INTRAMUSCULAR | Status: DC

## 2012-10-29 MED ORDER — SENNOSIDES-DOCUSATE SODIUM 8.6-50 MG PO TABS
2.0000 | ORAL_TABLET | Freq: Every day | ORAL | Status: DC
  Administered 2012-10-30: 2 via ORAL

## 2012-10-29 MED ORDER — LACTATED RINGERS IV SOLN: 500.0000 mL | INTRAVENOUS | Status: DC | PRN

## 2012-10-29 MED ORDER — BENZOCAINE-MENTHOL 20-0.5 % EX AERO
1.0000 "application " | INHALATION_SPRAY | CUTANEOUS | Status: DC | PRN
  Filled 2012-10-29: qty 56

## 2012-10-29 MED ORDER — CITRIC ACID-SODIUM CITRATE 334-500 MG/5ML PO SOLN: 30.0000 mL | ORAL | Status: DC | PRN

## 2012-10-29 MED ORDER — WITCH HAZEL-GLYCERIN EX PADS: 1.0000 "application " | MEDICATED_PAD | CUTANEOUS | Status: DC | PRN

## 2012-10-29 MED ORDER — LACTATED RINGERS IV SOLN
INTRAVENOUS | Status: DC
  Administered 2012-10-29 (×2): via INTRAVENOUS

## 2012-10-29 MED ORDER — OXYTOCIN 40 UNITS IN LACTATED RINGERS INFUSION - SIMPLE MED
1.0000 m[IU]/min | INTRAVENOUS | Status: DC
  Administered 2012-10-29: 2 m[IU]/min via INTRAVENOUS
  Filled 2012-10-29: qty 1000

## 2012-10-29 MED ORDER — ONDANSETRON HCL 4 MG/2ML IJ SOLN: 4.0000 mg | Freq: Four times a day (QID) | INTRAMUSCULAR | Status: DC | PRN

## 2012-10-29 MED ORDER — LIDOCAINE HCL (PF) 1 % IJ SOLN
30.0000 mL | INTRAMUSCULAR | Status: DC | PRN
  Filled 2012-10-29: qty 30

## 2012-10-29 MED ORDER — DIPHENHYDRAMINE HCL 50 MG/ML IJ SOLN: 12.5000 mg | INTRAMUSCULAR | Status: DC | PRN

## 2012-10-29 MED ORDER — ONDANSETRON HCL 4 MG PO TABS: 4.0000 mg | ORAL_TABLET | ORAL | Status: DC | PRN

## 2012-10-29 MED ORDER — ZOLPIDEM TARTRATE 5 MG PO TABS: 5.0000 mg | ORAL_TABLET | Freq: Every evening | ORAL | Status: DC | PRN

## 2012-10-29 MED ORDER — FENTANYL 2.5 MCG/ML BUPIVACAINE 1/10 % EPIDURAL INFUSION (WH - ANES)
14.0000 mL/h | INTRAMUSCULAR | Status: DC
  Administered 2012-10-29: 14 mL/h via EPIDURAL
  Filled 2012-10-29: qty 125

## 2012-10-29 MED ORDER — LANOLIN HYDROUS EX OINT: TOPICAL_OINTMENT | CUTANEOUS | Status: DC | PRN

## 2012-10-29 MED ORDER — PHENYLEPHRINE 40 MCG/ML (10ML) SYRINGE FOR IV PUSH (FOR BLOOD PRESSURE SUPPORT)
80.0000 ug | PREFILLED_SYRINGE | INTRAVENOUS | Status: DC | PRN
  Filled 2012-10-29: qty 5

## 2012-10-29 MED ORDER — PHENYLEPHRINE 40 MCG/ML (10ML) SYRINGE FOR IV PUSH (FOR BLOOD PRESSURE SUPPORT): 80.0000 ug | PREFILLED_SYRINGE | INTRAVENOUS | Status: DC | PRN

## 2012-10-29 MED ORDER — IBUPROFEN 600 MG PO TABS: 600.0000 mg | ORAL_TABLET | Freq: Four times a day (QID) | ORAL | Status: DC | PRN

## 2012-10-29 NOTE — Progress Notes (Signed)
Patient ID: Amanda Strong, female   DOB: [birth year], 32 y.o.   MRN: [id number]  H&P reviewed, no changes Pt doesn't desire pitocin, will try nipple stim and stripping membranes AROM 4hr after PCN Pt understands may need pitocin At least for now declines epidural 150's mod var  toco irr/rare 2.3/50/-2, posterior  31yo G3P1011 at 40+ for iol

## 2012-10-29 NOTE — Progress Notes (Signed)
Patient ID: Amanda Strong, female   DOB: 04/02/1981, 32 y.o.   MRN: [id number]  Some ctx.    AFVSS gen NAD FHTs 150's min-mod var, category II toco q 3-4 min SVE 3.4/80/0 AROM for clear fluid, w/o diff/comp  31to G3P1011 at 40+ Expect SVD Continue pitocin/ current plan Expect SVD

## 2012-10-29 NOTE — Progress Notes (Signed)
Patient ID: Amanda Strong, female   DOB: August 05, 1981, 32 y.o.   MRN: [id number]  Comfortable with epidural  AFVSS gen NAD FHTs 145-150, mod var toco q 3 min  SVE 10/100/+2  Will start pushing soon, expect SVD

## 2012-10-29 NOTE — Anesthesia Procedure Notes (Signed)

## 2012-10-29 NOTE — Anesthesia Preprocedure Evaluation (Signed)
Anesthesia Evaluation  Patient identified by MRN, date of birth, ID band Patient awake    Reviewed: Allergy & Precautions, H&P , [name] Chart, lab work & pertinent test results  Airway Mallampati: II  TM Distance: >3 FB Neck ROM: full    Dental  (+) Teeth Intact   Pulmonary  breath sounds clear to auscultation        Cardiovascular hypertension, On Medications Rhythm:regular Rate:Normal     Neuro/Psych    GI/Hepatic   Endo/Other    Renal/GU      Musculoskeletal   Abdominal   Peds  Hematology   Anesthesia Other Findings       Reproductive/Obstetrics (+) Pregnancy                             Anesthesia Physical Anesthesia Plan  ASA: II  Anesthesia Plan: Epidural   Post-op Pain Management:    Induction:   Airway Management Planned:   Additional Equipment:   Intra-op Plan:   Post-operative Plan:   Informed Consent: I have reviewed the patients History and Physical, chart, labs and discussed the procedure including the risks, benefits and alternatives for the proposed anesthesia with the patient or authorized representative who has indicated his/her understanding and acceptance.   Dental Advisory Given  Plan Discussed with:   Anesthesia Plan Comments: (Labs checked- platelets confirmed with RN in room. Fetal heart tracing, per RN, reported to be stable enough for sitting procedure. Discussed epidural, and patient consents to the procedure:  included risk of possible headache,backache, failed block, allergic reaction, and nerve injury. This patient was asked if she had any questions or concerns before the procedure started.)        Anesthesia Quick Evaluation  

## 2012-10-30 LAB — CBC
HCT: 27.3 % — ABNORMAL LOW (ref 36.0–46.0)
Hemoglobin: 9.4 g/dL — ABNORMAL LOW (ref 12.0–15.0)
MCHC: 34.4 g/dL (ref 30.0–36.0)
MCV: 92.5 fL (ref 78.0–100.0)
RDW: 13 % (ref 11.5–15.5)

## 2012-10-30 MED ORDER — PRENATAL MULTIVITAMIN CH: 1.0000 | ORAL_TABLET | Freq: Every day | ORAL | Status: DC

## 2012-10-30 MED ORDER — HYDROCODONE-ACETAMINOPHEN 5-325 MG PO TABS: 1.0000 | ORAL_TABLET | Freq: Four times a day (QID) | ORAL | Status: DC | PRN

## 2012-10-30 MED ORDER — IBUPROFEN 800 MG PO TABS: 800.0000 mg | ORAL_TABLET | Freq: Three times a day (TID) | ORAL | Status: DC | PRN

## 2012-10-30 NOTE — Progress Notes (Addendum)
Post Partum Day 1 Subjective: no complaints, up ad lib, voiding, tolerating PO and nl lochia, pain controlled  Objective: Blood pressure 103/67, pulse 87, temperature 98.1 F (36.7 C), temperature source Oral, resp. rate 18, height 5\' 7"  (1.702 m), weight 89.359 kg (197 lb), last menstrual period 01/05/2012, SpO2 100.00%, unknown if currently breastfeeding.  Physical Exam:  General: alert and no distress Lochia: appropriate Uterine Fundus: firm  Recent Labs  10/29/12 0810 10/30/12 0515  HGB 12.1 9.4*  HCT 35.2* 27.3*    Assessment/Plan: Plan for discharge tomorrow, Breastfeeding and Lactation consult.  Routine care.  Pt desires d/c to home, knows won't be until 5 pm.  Will d/c with Motrin/percocet/PNV, f/u 6 wks.   LOS: 1 day   Strong,Amanda Victorino 10/30/2012, 8:43 AM

## 2012-10-30 NOTE — Anesthesia Postprocedure Evaluation (Signed)
  Anesthesia Post-op Note  Patient: Amanda Strong  Procedure(s) Performed: * No procedures listed *  Patient Location: Mother/Baby  Anesthesia Type:Epidural  Level of Consciousness: awake  Airway and Oxygen Therapy: Patient Spontanous Breathing  Post-op Pain: none  Post-op Assessment: [name] Cardiovascular Status Stable, Respiratory Function Stable, Patent Airway, No signs of Nausea or vomiting, Adequate PO intake, Pain level controlled, No headache, No backache, No residual numbness and No residual motor weakness  Post-op Vital Signs: Reviewed and stable  Complications: No apparent anesthesia complications

## 2012-10-30 NOTE — Discharge Summary (Addendum)
Obstetric Discharge Summary Reason for Admission: induction of labor Prenatal Procedures: none Intrapartum Procedures: spontaneous vaginal delivery Postpartum Procedures: none Complications-Operative and Postpartum: vaginal laceration Hemoglobin  Date Value Range Status  10/30/2012 9.4* 12.0 - 15.0 g/dL Final     DELTA CHECK NOTED     REPEATED TO VERIFY     HCT  Date Value Range Status  10/30/2012 27.3* 36.0 - 46.0 % Final    Physical Exam:  General: alert and no distress Lochia: appropriate Uterine Fundus: firm  Discharge Diagnoses: Term Pregnancy-delivered  Discharge Information: Date: 10/30/2012 Activity: pelvic rest Diet: routine Medications: PNV, Ibuprofen and Vicodin Condition: stable Instructions: refer to practice specific booklet Discharge to: home Follow-up Information   Follow up with BOVARD,Keishawn Rajewski, MD. Schedule an appointment as soon as possible for a visit in 6 weeks.   Contact information:   [address] [place] [address] [phone]       Newborn Data: Live born female  Birth Weight: 8 lb 4.6 oz (3760 g) APGAR: 8, 9  Home with mother  Pt desires [facility] will arrange thru office.     BOVARD,Sahara Fujimoto 10/30/2012, 8:55 AM  D/C to home.  D/c cancelled yesterday due to pt request.  Doing well.

## 2012-10-31 NOTE — Progress Notes (Signed)
Patient ID: Amanda Strong, female   DOB: [birth year], 32 y.o.   MRN: [id number]  D/C cancelled yesterday, due to pt request.  Ready to go home now!    AFVSS gen NAD Abd soft, FFNT  Will d/c home with motrin/vicodin/pnv, f/u 6 wks.  [facility] after f/u.  Call with questions.

## 2013-03-15 ENCOUNTER — Other Ambulatory Visit: Payer: Self-pay | Admitting: Internal Medicine

## 2013-11-21 ENCOUNTER — Telehealth: Payer: Self-pay | Admitting: Internal Medicine

## 2013-11-21 NOTE — Telephone Encounter (Signed)
Patient Information:  Caller Name: Tess  Phone: [phone]  Patient: Amanda Strong, Amanda Strong  Gender: Female  DOB: [birth year]  Age: 33 Years  PCP: Darryll CapersJenkins, John (Adults only)  Pregnant: No  Office Follow Up:  Does the office need to follow up with this patient?: No  Instructions For The Office: N/A  RN Note:  No appointments available in office this afternoon. Offered to schedule an appointment in office on 11/22/13, but patient reports she has to work and could not come at that time. [facility] Would also like to schedule an appointment for well visit with her PCP soon.  Symptoms  Reason For Call & Symptoms: High blood pressure, fatigue, headache. Blood pressure today 156/102, heart rate 89.  Reviewed Health History In EMR: Yes  Reviewed Medications In EMR: Yes  Reviewed Allergies In EMR: Yes  Reviewed Surgeries / Procedures: Yes  Date of Onset of Symptoms: 11/18/2013  Treatments Tried: Prescription HTN medicine (patient cannot remember name of medicine)  Treatments Tried Worked: No OB / GYN:  LMP: 11/18/2013  Guideline(s) Used:  High Blood Pressure  Disposition Per Guideline:   See Today in Office  Reason For Disposition Reached:   Patient wants to be seen  Advice Given:  N/A  Patient Will Follow Care Advice:  YES

## 2014-01-22 ENCOUNTER — Emergency Department (HOSPITAL_COMMUNITY)
Admission: EM | Admit: 2014-01-22 | Discharge: 2014-01-22 | Disposition: A | Discharge status: Home / Self Care | Payer: BC Managed Care – PPO | Source: Home / Self Care

## 2014-01-22 ENCOUNTER — Other Ambulatory Visit (HOSPITAL_COMMUNITY)
Admission: RE | Admit: 2014-01-22 | Discharge: 2014-01-22 | Disposition: A | Discharge status: Home / Self Care | Payer: BC Managed Care – PPO | Source: Ambulatory Visit | Attending: Emergency Medicine | Admitting: Emergency Medicine

## 2014-01-22 ENCOUNTER — Encounter (HOSPITAL_COMMUNITY): Payer: Self-pay | Admitting: Emergency Medicine

## 2014-01-22 DIAGNOSIS — Z113 Encounter for screening for infections with a predominantly sexual mode of transmission: Secondary | ICD-10-CM | POA: Insufficient documentation

## 2014-01-22 DIAGNOSIS — N760 Acute vaginitis: Secondary | ICD-10-CM | POA: Insufficient documentation

## 2014-01-22 DIAGNOSIS — L739 Follicular disorder, unspecified: Secondary | ICD-10-CM

## 2014-01-22 DIAGNOSIS — L738 Other specified follicular disorders: Secondary | ICD-10-CM

## 2014-01-22 MED ORDER — MUPIROCIN 2 % EX OINT: TOPICAL_OINTMENT | CUTANEOUS | Status: DC

## 2014-01-22 MED ORDER — DOXYCYCLINE HYCLATE 100 MG PO TABS: 100.0000 mg | ORAL_TABLET | Freq: Two times a day (BID) | ORAL | Status: DC

## 2014-01-22 MED ORDER — CHLORHEXIDINE GLUCONATE 4 % EX LIQD: Freq: Every day | CUTANEOUS | Status: DC | PRN

## 2014-01-22 NOTE — ED Provider Notes (Signed)
  Chief Complaint   Chief Complaint  Patient presents with  . Rash  . Vaginal Itching    History of Present Illness   Amanda Strong is a 33 year old female whose main complaint has been mildly pruritic bumps on her legs. These have been going on for the past 2 days. They're not painful. She denies any systemic symptoms such as fever, chills, headache, or muscle aches. She's not had any difficulty breathing. She denies any exposure to any contactants or any other sick exposures. She also wanted to be checked for any kind of GYN infection. She denies any discharge or itching. She denies any lesions on the vulva.  Review of Systems   Other than as noted above, the patient denies any of the following symptoms: Systemic:  No fever or chills GI:  No abdominal pain, nausea, vomiting, diarrhea, constipation, melena or hematochezia. GU:  No dysuria, frequency, urgency, hematuria, vaginal discharge, itching, or abnormal vaginal bleeding.  PMFSH   Past medical history, family history, social history, meds, and allergies were reviewed.    Physical Examination    Vital signs:  BP 166/106  Pulse 75  Temp(Src) 97.9 F (36.6 C) (Oral)  Resp 16  SpO2 99%  LMP 01/13/2014  Breastfeeding? No General:  Alert, oriented and in no distress. Lungs:  Breath sounds clear and equal bilaterally.  No wheezes, rales or rhonchi. Heart:  Regular rhythm.  No gallops or murmers. Abdomen:  Soft, flat and non-distended.  No organomegaly or mass.  No tenderness, guarding or rebound.  Bowel sounds normally active. Pelvic exam:  There were 3 small blisters on the right labia majora. These were cultured for HSV. Speculum exam reveals minimal white discharge, IUD string was in place, there was no bleeding. She had no pain on cervical motion, uterus was normal in size and shape and nontender. No adnexal masses or tenderness.  DNA probes for gonorrhea, Chlamydia, Trichomonas, Gardnerella, Candida were obtained. Skin:   She had widely scattered erythematous papules on both thighs and buttocks consistent with folliculitis.   Assessment   The encounter diagnosis was Folliculitis.  Vulvar lesions may have been HSV and cultures are pending.       Plan    1.  Meds:  The following meds were prescribed:   Discharge Medication List as of 01/22/2014  7:22 PM    START taking these medications   Details  chlorhexidine (HIBICLENS) 4 % external liquid Apply topically daily as needed., Starting 01/22/2014, Until Discontinued, Normal    doxycycline (VIBRA-TABS) 100 MG tablet Take 1 tablet (100 mg total) by mouth 2 (two) times daily., Starting 01/22/2014, Until Discontinued, Normal    mupirocin ointment (BACTROBAN) 2 % Apply to both nostrils TID for 1 month., Normal        2.  Patient Education/Counseling:  The patient was given appropriate handouts, self care instructions, and instructed in symptomatic relief.    3.  Follow up:  The patient was told to follow up here if no better in 3 to 4 days, or sooner if becoming worse in any way, and given some red flag symptoms such as worsening pain, fever, persistent vomiting, or heavy vaginal bleeding which would prompt immediate return.       Reuben Likesavid C Emma-Lee Oddo, MD 01/22/14 2226

## 2014-01-22 NOTE — ED Notes (Signed)
Call from lab, clarification of site

## 2014-01-22 NOTE — Discharge Instructions (Signed)
Folliculitis  Folliculitis is redness, soreness, and swelling (inflammation) of the hair follicles. This condition can occur anywhere on the body. People with weakened immune systems, diabetes, or obesity have a greater risk of getting folliculitis. CAUSES  Bacterial infection. This is the most common cause.  Fungal infection.  Viral infection.  Contact with certain chemicals, especially oils and tars. Long-term folliculitis can result from bacteria that live in the nostrils. The bacteria may trigger multiple outbreaks of folliculitis over time. SYMPTOMS Folliculitis most commonly occurs on the scalp, thighs, legs, back, buttocks, and areas where hair is shaved frequently. An early sign of folliculitis is a small, white or yellow, pus-filled, itchy lesion (pustule). These lesions appear on a red, inflamed follicle. They are usually less than 0.2 inches (5 mm) wide. When there is an infection of the follicle that goes deeper, it becomes a boil or furuncle. A group of closely packed boils creates a larger lesion (carbuncle). Carbuncles tend to occur in hairy, sweaty areas of the body. DIAGNOSIS  Your caregiver can usually tell what is wrong by doing a physical exam. A sample may be taken from one of the lesions and tested in a lab. This can help determine what is causing your folliculitis. TREATMENT  Treatment may include:  Applying warm compresses to the affected areas.  Taking antibiotic medicines orally or applying them to the skin.  Draining the lesions if they contain a large amount of pus or fluid.  Laser hair removal for cases of long-lasting folliculitis. This helps to prevent regrowth of the hair. HOME CARE INSTRUCTIONS  Apply warm compresses to the affected areas as directed by your caregiver.  If antibiotics are prescribed, take them as directed. Finish them even if you start to feel better.  You may take over-the-counter medicines to relieve itching.  Do not shave  irritated skin.  Follow up with your caregiver as directed. SEEK IMMEDIATE MEDICAL CARE IF:   You have increasing redness, swelling, or pain in the affected area.  You have a fever. MAKE SURE YOU:  Understand these instructions.  Will watch your condition.  Will get help right away if you are not doing well or get worse. Document Released: 11/14/2001 Document Revised: 03/06/2012 Document Reviewed: 12/06/2011 [employer] Patient Information 2014 [employer].  

## 2014-01-22 NOTE — ED Notes (Signed)
C/o rash on buttocks and back of legs with iritation.   Pt also was to be checked for a possible yeast infection.  Denies any abnormal discharge or pelvic/abdominal pain.

## 2014-01-23 LAB — CERVICOVAGINAL ANCILLARY ONLY
CHLAMYDIA, DNA PROBE: POSITIVE — AB
Neisseria Gonorrhea: NEGATIVE
Wet Prep (BD Affirm): NEGATIVE
Wet Prep (BD Affirm): NEGATIVE
Wet Prep (BD Affirm): NEGATIVE

## 2014-01-24 ENCOUNTER — Telehealth (HOSPITAL_COMMUNITY): Payer: Self-pay | Admitting: Emergency Medicine

## 2014-01-24 LAB — HERPES SIMPLEX VIRUS CULTURE
CULTURE: DETECTED
Special Requests: NORMAL

## 2014-01-24 MED ORDER — ACYCLOVIR 400 MG PO TABS: ORAL_TABLET | ORAL | Status: DC

## 2014-01-24 NOTE — ED Notes (Addendum)
Herpes simplex culture was positive. She was not treated. I will send in a prescription to her pharmacy for acyclovir 400 mg, one every 4 hours while awake, 5 times a day with 2 refills. We will need to call and inform her of this result.  Reuben Likesavid C Burton Gahan, MD 01/24/14 1611  Reuben Likesavid C Nahuel Wilbert, MD 01/24/14 [phone]

## 2014-01-25 ENCOUNTER — Telehealth (HOSPITAL_COMMUNITY): Payer: Self-pay | Admitting: *Deleted

## 2014-01-25 NOTE — ED Notes (Signed)
Pt. called back.  Pt. verified x 2 and given results.  Pt. told she was adequately treated for Chlamydia with Doxycycline and she needs Acyclovir for the Herpes.  Pt. instructed to notify her partner, that she can pass the virus even when she doesn't have an outbreak, so always practice safe sex, and to get treated for each outbreak with Acyclovir or Valtrex. Recommended she get an OB-GYN doctor who can give you a 1 yr Rx. to fill with each outbreak or suppressive therapy if needed.  Pt. asked if I had the right pt. and I verified her name and DOB.  Pt. instructed to notify her partner to get treated for Chlamydia, no sex for 1 week and to practice safe sex. Pt. told [facility], by appointment. Desiree LucySuzanne M Kaige Whistler 01/25/2014

## 2014-01-25 NOTE — ED Notes (Signed)
I called pt. and left a message to call. Amanda Strong 01/25/2014

## 2014-01-29 NOTE — ED Notes (Signed)
[facility]HS form completed and [facility]. [facility] 01/29/2014

## 2014-03-03 ENCOUNTER — Other Ambulatory Visit: Payer: Self-pay | Admitting: Internal Medicine

## 2014-03-05 ENCOUNTER — Telehealth: Payer: Self-pay | Admitting: Internal Medicine

## 2014-03-05 NOTE — Telephone Encounter (Signed)
Patient Information:  Caller Name: Gurleen  Phone: [phone]  Patient: Amanda Strong, Amanda Strong  Gender: Female  DOB: 1981-04-19  Age: 33 Years  PCP: Amanda Strong, Amanda Strong (Adults only, leaving end of July 2015)  Pregnant: No  Office Follow Up:  Does the office need to follow up with this patient?: No  Instructions For The Office: N/A   Symptoms  Reason For Call & Symptoms: Calling about running out BP med on 03/03/14 -she was taking Methyldopa before and after pregnancy. BP in range of 140-150/ 80-108. She has been getting headaches on and off. Used to take Benicar 20 mgs HCTZ 12.5 before pregnancy. Also needing Well Check and Thyroid screening done. Will call back to schedule.   Reviewed Health History In EMR: Yes  Reviewed Medications In EMR: Yes  Reviewed Allergies In EMR: Yes  Reviewed Surgeries / Procedures: Yes  Date of Onset of Symptoms: 03/03/2014 OB / GYN:  LMP: 02/12/2014  Guideline(s) Used:  High Blood Pressure  Disposition Per Guideline:   See Today in Office  Reason For Disposition Reached:   Patient wants to be seen but will call back to schedule appnt  Advice Given:  General:  Untreated high blood pressure may cause damage to the heart, brain, kidneys, and eyes.  Treatment of high blood pressure can reduce the risk of stroke, heart attack, and heart failure.  The goal of blood pressure treatment for most patients with hypertension is to keep the blood pressure under 140/90.  Lifestyle Changes  Maintain a healthy weight. Lose weight if you are overweight.  Do 30 minutes of aerobic physical activity (e.g., brisk walking) most days of the week.  Eat a diet high in fresh fruits and low-fat dairy products. Limit your intake of saturated and total fat. Choose foods that are lower in salt.  If you smoke, you should stop.  If you drink alcohol, you should limit your daily alcohol drinking. Women should have no more than one drink per day. Men should have no more than 2 drinks  per day. A drink is defined as 1.5 oz hard liquor (one shot or jigger; 45 ml), 5 oz wine (small glass; 150 ml), or 12 oz beer (one can; 360 ml).  Call Back If:  Headache, blurred vision, difficulty talking, or difficulty walking occurs  Chest pain or difficulty breathing occurs  You want to go in to the office for a blood pressure check  You become worse.  Call Back If:  Headache, blurred vision, difficulty talking, or difficulty walking occurs  Chest pain or difficulty breathing occurs  You want to come in to the office for a blood pressure check  You become worse.  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

## 2014-03-05 NOTE — Telephone Encounter (Signed)
Noted  

## 2014-03-20 ENCOUNTER — Emergency Department (HOSPITAL_COMMUNITY)
Admission: EM | Admit: 2014-03-20 | Discharge: 2014-03-20 | Disposition: A | Discharge status: Home / Self Care | Payer: BC Managed Care – PPO | Source: Home / Self Care | Attending: Family Medicine | Admitting: Family Medicine

## 2014-03-20 ENCOUNTER — Encounter (HOSPITAL_COMMUNITY): Payer: Self-pay | Admitting: Emergency Medicine

## 2014-03-20 DIAGNOSIS — I10 Essential (primary) hypertension: Secondary | ICD-10-CM

## 2014-03-20 DIAGNOSIS — J020 Streptococcal pharyngitis: Secondary | ICD-10-CM

## 2014-03-20 LAB — POCT I-STAT, CHEM 8
BUN: 6 mg/dL (ref 6–23)
CHLORIDE: 100 meq/L (ref 96–112)
CREATININE: 0.7 mg/dL (ref 0.50–1.10)
Calcium, Ion: 1.16 mmol/L (ref 1.12–1.23)
GLUCOSE: 91 mg/dL (ref 70–99)
HCT: 43 % (ref 36.0–46.0)
Hemoglobin: 14.6 g/dL (ref 12.0–15.0)
POTASSIUM: 3.2 meq/L — AB (ref 3.7–5.3)
Sodium: 140 mEq/L (ref 137–147)
TCO2: 26 mmol/L (ref 0–100)

## 2014-03-20 MED ORDER — AMOXICILLIN 500 MG PO CAPS: 1000.0000 mg | ORAL_CAPSULE | Freq: Two times a day (BID) | ORAL | Status: DC

## 2014-03-20 MED ORDER — TRIAMTERENE-HCTZ 37.5-25 MG PO TABS: 1.0000 | ORAL_TABLET | Freq: Every day | ORAL | Status: DC

## 2014-03-20 NOTE — Discharge Instructions (Signed)
Thank you for coming in today. STOP methyldopa.  Start maxzide Follow up with your doctor.  Take amoxicillin for strep throat.  Call or go to the emergency room if you get worse, have trouble breathing, have chest pains, or palpitations.   Strep Throat Strep throat is an infection of the throat caused by a bacteria named Streptococcus pyogenes. Your caregiver may call the infection streptococcal "tonsillitis" or "pharyngitis" depending on whether there are signs of inflammation in the tonsils or back of the throat. Strep throat is most common in children aged 5-15 years during the cold months of the year, but it can occur in people of any age during any season. This infection is spread from person to person (contagious) through coughing, sneezing, or other close contact. SYMPTOMS   Fever or chills.  Painful, swollen, red tonsils or throat.  Pain or difficulty when swallowing.  White or yellow spots on the tonsils or throat.  Swollen, tender lymph nodes or "glands" of the neck or under the jaw.  Red rash all over the body (rare). DIAGNOSIS  Many different infections can cause the same symptoms. A test must be done to confirm the diagnosis so the right treatment can be given. A "rapid strep test" can help your caregiver make the diagnosis in a few minutes. If this test is not available, a light swab of the infected area can be used for a throat culture test. If a throat culture test is done, results are usually available in a day or two. TREATMENT  Strep throat is treated with antibiotic medicine. HOME CARE INSTRUCTIONS   Gargle with 1 tsp of salt in 1 cup of warm water, 3-4 times per day or as needed for comfort.  Family members who also have a sore throat or fever should be tested for strep throat and treated with antibiotics if they have the strep infection.  Make sure everyone in your household washes their hands well.  Do not share food, drinking cups, or personal items that  could cause the infection to spread to others.  You may need to eat a soft food diet until your sore throat gets better.  Drink enough water and fluids to keep your urine clear or pale yellow. This will help prevent dehydration.  Get plenty of rest.  Stay home from school, daycare, or work until you have been on antibiotics for 24 hours.  Only take over-the-counter or prescription medicines for pain, discomfort, or fever as directed by your caregiver.  If antibiotics are prescribed, take them as directed. Finish them even if you start to feel better. SEEK MEDICAL CARE IF:   The glands in your neck continue to enlarge.  You develop a rash, cough, or earache.  You cough up green, yellow-brown, or bloody sputum.  You have pain or discomfort not controlled by medicines.  Your problems seem to be getting worse rather than better. SEEK IMMEDIATE MEDICAL CARE IF:   You develop any new symptoms such as vomiting, severe headache, stiff or painful neck, chest pain, shortness of breath, or trouble swallowing.  You develop severe throat pain, drooling, or changes in your voice.  You develop swelling of the neck, or the skin on the neck becomes red and tender.  You have a fever.  You develop signs of dehydration, such as fatigue, dry mouth, and decreased urination.  You become increasingly sleepy, or you cannot wake up completely. Document Released: 09/02/2000 Document Revised: 08/22/2012 Document Reviewed: 11/04/2010 [employer] Patient Information 2015 [employer]. This information is not intended to replace advice given to you by your health care provider. Make sure you discuss any questions you have with your health care provider.   Hypertension Hypertension, commonly called high blood pressure, is when the force of blood pumping through your arteries is too strong. Your arteries are the blood vessels that carry blood from your heart throughout your body. A blood pressure reading  consists of a higher number over a lower number, such as 110/72. The higher number (systolic) is the pressure inside your arteries when your heart pumps. The lower number (diastolic) is the pressure inside your arteries when your heart relaxes. Ideally you want your blood pressure below 120/80. Hypertension forces your heart to work harder to pump blood. Your arteries may become narrow or stiff. Having hypertension puts you at risk for heart disease, stroke, and other problems.  RISK FACTORS Some risk factors for high blood pressure are controllable. Others are not.  Risk factors you cannot control include:   Race. You may be at higher risk if you are African American.  Age. Risk increases with age.  Gender. Men are at higher risk than women before age 33 years. After age 265, women are at higher risk than men. Risk factors you can control include:  Not getting enough exercise or physical activity.  Being overweight.  Getting too much fat, sugar, calories, or salt in your diet.  Drinking too much alcohol. SIGNS AND SYMPTOMS Hypertension does not usually cause signs or symptoms. Extremely high blood pressure (hypertensive crisis) may cause headache, anxiety, shortness of breath, and nosebleed. DIAGNOSIS  To check if you have hypertension, your health care provider will measure your blood pressure while you are seated, with your arm held at the level of your heart. It should be measured at least twice using the same arm. Certain conditions can cause a difference in blood pressure between your right and left arms. A blood pressure reading that is higher than normal on one occasion does not mean that you need treatment. If one blood pressure reading is high, ask your health care provider about having it checked again. TREATMENT  Treating high blood pressure includes making lifestyle changes and possibly taking medication. Living a healthy lifestyle can help lower high blood pressure. You may need  to change some of your habits. Lifestyle changes may include:  Following the DASH diet. This diet is high in fruits, vegetables, and whole grains. It is low in salt, red meat, and added sugars.  Getting at least 2 1/2 hours of brisk physical activity every week.  Losing weight if necessary.  Not smoking.  Limiting alcoholic beverages.  Learning ways to reduce stress. If lifestyle changes are not enough to get your blood pressure under control, your health care provider may prescribe medicine. You may need to take more than one. Work closely with your health care provider to understand the risks and benefits. HOME CARE INSTRUCTIONS  Have your blood pressure rechecked as directed by your health care provider.   Only take medicine as directed by your health care provider. Follow the directions carefully. Blood pressure medicines must be taken as prescribed. The medicine does not work as well when you skip doses. Skipping doses also puts you at risk for problems.   Do not smoke.   Monitor your blood pressure at home as directed by your health care provider. SEEK MEDICAL CARE IF:   You think you are having a reaction to medicines taken.  You have recurrent headaches or feel dizzy.  You have swelling in your ankles.  You have trouble with your vision. SEEK IMMEDIATE MEDICAL CARE IF:  You develop a severe headache or confusion.  You have unusual weakness, numbness, or feel faint.  You have severe chest or abdominal pain.  You vomit repeatedly.  You have trouble breathing. MAKE SURE YOU:   Understand these instructions.  Will watch your condition.  Will get help right away if you are not doing well or get worse. Document Released: 09/05/2005 Document Revised: 09/10/2013 Document Reviewed: 06/28/2013 [facility] Patient Information 2015 [place], [place]. This information is not intended to replace advice given to you by your health care provider. Make sure you discuss any  questions you have with your health care provider.

## 2014-03-20 NOTE — ED Notes (Signed)
C/o sore throat onset yesterday and worse today.  Tonsils swollen and glands are swollen.  C/o chills and fever.

## 2014-03-20 NOTE — ED Provider Notes (Signed)
Amanda Strong is a 33 y.o. female who presents to Urgent Care today for sore throat. Patient is fevers and chills along with sore throat. Pain is worse with swallowing and is moderate. No cough congestion nausea vomiting or diarrhea or runny nose. Symptoms started yesterday. Additionally patient notes elevated blood pressure. This is been persistent since her last pregnancy. She continues to take methyldopa. No chest pains palpitations or shortness of breath.   Past Medical History  Diagnosis Date  . Acne   . Hypertension   . Allergy   . Depression   . Hyperthyroidism   . History of chicken pox   . Abnormal Pap smear   . Normal pregnancy, repeat 10/28/2012  . Group B streptococcal infection in pregnancy 10/28/2012   History  Substance Use Topics  . Smoking status: Former Smoker    Quit date: 10/22/2008  . Smokeless tobacco: Never Used  . Alcohol Use: Yes     Comment: occasionally   ROS as above Medications: No current facility-administered medications for this encounter.   Current Outpatient Prescriptions  Medication Sig Dispense Refill  . [DISCONTINUED] methyldopa (ALDOMET) 250 MG tablet TAKE 1 TABLET TWICE A DAY  60 tablet  4  . amoxicillin (AMOXIL) 500 MG capsule Take 2 capsules (1,000 mg total) by mouth 2 (two) times daily.  40 capsule  0  . triamterene-hydrochlorothiazide (MAXZIDE-25) 37.5-25 MG per tablet Take 1 tablet by mouth daily.  30 tablet  0  . [DISCONTINUED] clonazePAM (KLONOPIN) 0.5 MG tablet Take 1 tablet (0.5 mg total) by mouth 2 (two) times daily.  60 tablet  5  . [DISCONTINUED] desvenlafaxine (PRISTIQ) 50 MG 24 hr tablet Take 1 tablet (50 mg total) by mouth daily.  21 tablet  0  . [DISCONTINUED] olmesartan-hydrochlorothiazide (BENICAR HCT) 20-12.5 MG per tablet Take 1 tablet by mouth daily.          Exam:  BP 162/115  Pulse 89  Temp(Src) 100.4 F (38 C) (Oral)  Resp 20  SpO2 100%  LMP 03/15/2014  Breastfeeding? No Gen: Well NAD HEENT: EOMI,  MMM  posterior pharynx is erythematous with a daily. Tender bilateral anterior cervical lymphadenopathy is present. Lungs: Normal work of breathing. CTABL Heart: RRR no MRG Abd: NABS, Soft. NT, ND Exts: Brisk capillary refill, warm and well perfused.   Results for orders placed during the hospital encounter of 03/20/14 (from the past 24 hour(s))  POCT I-STAT, CHEM 8     Status: Abnormal   Collection Time    03/20/14  5:58 PM      Result Value Ref Range   Sodium 140  137 - 147 mEq/L   Potassium 3.2 (*) 3.7 - 5.3 mEq/L   Chloride 100  96 - 112 mEq/L   BUN 6  6 - 23 mg/dL   Creatinine, Ser 1.610.70  0.50 - 1.10 mg/dL   Glucose, Bld 91  70 - 99 mg/dL   Calcium, Ion 0.961.16  0.451.12 - 1.23 mmol/L   TCO2 26  0 - 100 mmol/L   Hemoglobin 14.6  12.0 - 15.0 g/dL   HCT 40.943.0  81.136.0 - 91.446.0 %   No results found.  Assessment and Plan: 33 y.o. female with  1) strep throat: Centor score  Elevated. No testing. Treatment with amoxicillin.  2) hypertension: Discontinue methyldopa. Start Maxzide F/u w PCP  Discussed warning signs or symptoms. Please see discharge instructions. Patient expresses understanding.    Rodolph BongEvan S Wacey Zieger, MD 03/20/14 2043

## 2014-03-25 NOTE — ED Notes (Signed)
Patient called w many questions about her Rx, how soon should she start to feel better, why is she still dizzy. Advised to return for recheck or to call her PCP to be put on cancellation list for poss earlier appointmnet

## 2014-03-25 NOTE — ED Notes (Signed)
Chart review.

## 2014-03-31 ENCOUNTER — Encounter: Payer: Self-pay | Admitting: *Deleted

## 2014-04-17 ENCOUNTER — Encounter: Payer: Self-pay | Admitting: Physician Assistant

## 2014-04-17 ENCOUNTER — Ambulatory Visit (INDEPENDENT_AMBULATORY_CARE_PROVIDER_SITE_OTHER): Payer: BC Managed Care – PPO | Admitting: Physician Assistant

## 2014-04-17 ENCOUNTER — Other Ambulatory Visit: Payer: Self-pay | Admitting: *Deleted

## 2014-04-17 VITALS — BP 130/72 | HR 68 | Temp 98.4°F | Resp 14 | Ht 67.0 in | Wt 144.0 lb

## 2014-04-17 DIAGNOSIS — F411 Generalized anxiety disorder: Secondary | ICD-10-CM

## 2014-04-17 DIAGNOSIS — I10 Essential (primary) hypertension: Secondary | ICD-10-CM

## 2014-04-17 DIAGNOSIS — Z0000 Encounter for general adult medical examination without abnormal findings: Secondary | ICD-10-CM

## 2014-04-17 LAB — COMPLETE METABOLIC PANEL WITH GFR
ALBUMIN: 4 g/dL (ref 3.5–5.2)
ALK PHOS: 50 U/L (ref 39–117)
ALT: 21 U/L (ref 0–35)
AST: 17 U/L (ref 0–37)
BUN: 14 mg/dL (ref 6–23)
CO2: 29 meq/L (ref 19–32)
Calcium: 9.1 mg/dL (ref 8.4–10.5)
Chloride: 102 mEq/L (ref 96–112)
Creat: 0.9 mg/dL (ref 0.50–1.10)
GFR, EST NON AFRICAN AMERICAN: 85 mL/min
Glucose, Bld: 87 mg/dL (ref 70–99)
Potassium: 3.4 mEq/L — ABNORMAL LOW (ref 3.5–5.3)
Sodium: 140 mEq/L (ref 135–145)
Total Bilirubin: 0.2 mg/dL (ref 0.2–1.2)
Total Protein: 6.9 g/dL (ref 6.0–8.3)

## 2014-04-17 LAB — LIPID PANEL
CHOL/HDL RATIO: 3.1 ratio
CHOLESTEROL: 114 mg/dL (ref 0–200)
HDL: 37 mg/dL — ABNORMAL LOW (ref >39)
LDL Cholesterol: 68 mg/dL (ref 0–99)
Triglycerides: 47 mg/dL (ref <150)
VLDL: 9 mg/dL (ref 0–40)

## 2014-04-17 MED ORDER — ALPRAZOLAM 0.25 MG PO TABS: 0.2500 mg | ORAL_TABLET | Freq: Two times a day (BID) | ORAL | Status: DC | PRN

## 2014-04-17 MED ORDER — TRIAMTERENE-HCTZ 37.5-25 MG PO TABS: 1.0000 | ORAL_TABLET | Freq: Every day | ORAL | Status: DC

## 2014-04-17 NOTE — Progress Notes (Signed)
Patient ID: TYMIA STREB MRN: [id number], DOB: 1980/12/18, 33 y.o. Date of Encounter: 04/17/2014,   Chief Complaint: Physical (CPE)  HPI: 33 y.o. y/o AA female  here for CPE.   [name] also being seen as a new patient to establish care with her practice. She says that she was seeing Dr. Darryll Capers with [facility] for many years. Says her last visit there was about 2 or 3 years ago. Says that he has become more involved in research and is not as available for office visits as he was in the past. Between his schedule and her schedule, it was difficult for her to get to see him. [name] a single mom of 2 young kids and works at [employer] worked there for 9 years).  She does have an OB/GYN who she sees. She sees Dr. Ellyn Hack. Says that she has a little girl who is 39 months old. Dr. Ellyn Hack delivered her. Has an 2-year-old son-- he was delivered by Dr. Senaida Ores. In 2012 she had a miscarriage. She had a Mirena placed 12/2012. She had a Pap smear 03/14/2014. This was abnormal. She has a colposcopy scheduled for September 4.  She says that she has had problems with high blood pressure. Says this started right after she delivered her son. Says that she had no problems with hypertension even during that pregnancy. However, soon after delivery, her blood pressure went very high and she was even transferred to the ICU. She has continued to have hypertension since then. She recently had to go to the urgent care for management of her blood pressure. She says that she went to an urgent care one about one month ago because she had strep throat. At that time her blood pressure was elevated and they started her current medication of triamterene HCTZ. She says that she's been taking that daily since then and is glad that her blood pressure is reading good today on that medication. Also, today I noted that her family history is significant for many family members having hypertension including both of  her parents.  She also states that she has had problems with her thyroid function fluctuating. Says that she had problems with her thyroid prior to pregnancy but then it had gone back to normal. Says that it recently was abnormal again and Dr. Ellyn Hack has referred her to endocrinologist. She has an appointment with endocrinology [phone].  She also is asking if she can have a prescription for a medication that she could use as needed for anxiety. Says that she does not want to get on a medication that she has to take daily. Says that she just wants to have something available for "when she feels that she's about to pull her hair out"--says that it can be very difficult being a working single mom of 2 small kids.   Review of Systems: Consitutional: No fever, chills, fatigue, night sweats, lymphadenopathy. No significant/unexplained weight changes. Eyes: No visual changes, eye redness, or discharge. ENT/Mouth: No ear pain, sore throat, nasal drainage, or sinus pain. Cardiovascular: No chest pressure,heaviness, tightness or squeezing, even with exertion. No increased shortness of breath or dyspnea on exertion.No palpitations, edema, orthopnea, PND. Respiratory: No cough, hemoptysis, SOB, or wheezing. Gastrointestinal: No anorexia, dysphagia, reflux, pain, nausea, vomiting, hematemesis, diarrhea, constipation, BRBPR, or melena. Breast: No mass, nodules, bulging, or retraction. No skin changes or inflammation. No nipple discharge. No lymphadenopathy. Genitourinary: No dysuria, hematuria, incontinence, vaginal discharge, pruritis, burning, abnormal bleeding, or pain. Musculoskeletal:  No decreased ROM, No joint pain or swelling. No significant pain in neck, back, or extremities. Skin: No rash, pruritis, or concerning lesions. Neurological: No headache, dizziness, syncope, seizures, tremors, memory loss, coordination problems, or paresthesias. Psychological: No anxiety, depression, hallucinations,  SI/HI. Endocrine: No polydipsia, polyphagia, polyuria, or known diabetes.No increased fatigue. No palpitations/rapid heart rate. No significant/unexplained weight change. All other systems were reviewed and are otherwise negative.  Past Medical History  Diagnosis Date  . Acne   . Hypertension   . Allergy   . Depression   . Hyperthyroidism   . History of chicken pox   . Abnormal Pap smear   . Normal pregnancy, repeat 10/28/2012  . Group B streptococcal infection in pregnancy 10/28/2012  . Anxiety      Past Surgical History  Procedure Laterality Date  . Wisdom tooth extraction    . Bunionectomy    . Foot surgery      Home Meds:  Outpatient Prescriptions Prior to Visit  Medication Sig Dispense Refill  . Multiple Vitamins-Minerals (MULTIVITAMIN & MINERAL PO) Take 1 tablet by mouth daily.      . methyldopa (ALDOMET) 250 MG tablet Take 250 mg by mouth 2 (two) times daily.       No facility-administered medications prior to visit.    Allergies:  Allergies  Allergen Reactions  . Cephalexin     REACTION: intense itching all over  . Percocet [Oxycodone-Acetaminophen] Itching    History   Social History  . Marital Status: Single    Spouse Name: N/A    Number of Children: 1  . Years of Education: 16   Occupational History  . 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 Smoking status: Former Smoker    Quit date: 10/22/2008  . Smokeless tobacco: Never Used  . Alcohol Use: Yes     Comment: occasionally  . Drug Use: No  . Sexual Activity: Not Currently   Other Topics Concern  . Not on file   Social History Narrative   Caffeine Use-yes   Regular exercise-no      Entered 03/2014:   Single Mom of 2 children   Children are:      Girl--18 months old      Boy--33 years old   She works at [employer].    Currently works with wound care    Family History  Problem Relation Age of Onset  . Hypertension Mother   . Cancer Mother      breast  . Hypertension Father   . Arthritis Father   . Hypertension Maternal Aunt   . Hypertension Maternal Uncle   . Hypertension Maternal Grandmother   . Arthritis Maternal Grandmother   . Hyperlipidemia Maternal Grandmother   . Stroke Maternal Grandfather   . Alcohol abuse Maternal Grandfather   . Arthritis Maternal Grandfather   . Hypertension Paternal Grandmother   . Heart disease Paternal Grandmother   . Hypertension Paternal Grandfather   . Hypertension Paternal Aunt   . Hypertension Paternal Uncle     Physical Exam: Blood pressure 130/72, pulse 68, temperature 98.4 F (36.9 C), temperature source Oral, resp. rate 14, height 5\' 7"  (1.702 m), weight 144 lb (65.318 kg), last menstrual period 02/18/2014, not currently breastfeeding., Body mass index is 22.55 kg/(m^2). General: Well developed, well nourished,AAF. Appears in no acute distress. HEENT: Normocephalic, atraumatic. Conjunctiva pink, sclera non-icteric. Pupils 2 mm constricting to 1 mm, round, regular, and equally reactive to light  and accomodation. EOMI. Internal auditory canal clear. TMs with good cone of light and without pathology. Nasal mucosa pink. Nares are without discharge. No sinus tenderness. Oral mucosa pink.  Pharynx without exudate.   Neck: Supple. Trachea midline. No thyromegaly. Full ROM. No lymphadenopathy.No Carotid Bruits. Lungs: Clear to auscultation bilaterally without wheezes, rales, or rhonchi. Breathing is of normal effort and unlabored. Cardiovascular: RRR with S1 S2. No murmurs, rubs, or gallops. Distal pulses 2+ symmetrically. No carotid or abdominal bruits. Breast: Per Gyn. Deferred today. Abdomen: Soft, non-tender, non-distended with normoactive bowel sounds. No hepatosplenomegaly or masses. No rebound/guarding. No CVA tenderness. No hernias.  Genitourinary: Has had recently by Gyn. Musculoskeletal: Full range of motion and 5/5 strength throughout. Without swelling, atrophy, tenderness,  crepitus, or warmth. Extremities without clubbing, cyanosis, or edema. Calves supple. Skin: Warm and moist without erythema, ecchymosis, wounds, or rash. Neuro: A+Ox3. CN II-XII grossly intact. Moves all extremities spontaneously. Full sensation throughout. Normal gait. DTR 2+ throughout upper and lower extremities. Finger to nose intact. Psych:  Responds to questions appropriately with a normal affect.   Assessment/Plan:  33 y.o. y/o female here for CPE 1. Visit for preventive health examination  A. Screening Labs: [name] fasting today--except for sip of Pepsi. I will not repeat TSH as this has recently been checked by GYN and she is having followup with endocrinology. - CBC with Differential - COMPLETE METABOLIC PANEL WITH GFR - Lipid panel   B. Pap: See history of present illness. Had Pap smear 03/14/2014 by GYN. This was abnormal. Has colposcopy scheduled for 05/23/2014 with Dr. Ellyn Hack, Gyn.  C. Screening Mammogram: Not indicated until at least age 5.   E. Colorectal Cancer Screening: Not indicated until age 77.  F. Immunizations:  Influenza: N/A Tetanus: She received this 12/25/2008 Pneumococcal: She has no indication to need this until age 87   2. HYPERTENSION Blood pressure controlled on current medication. Continue current medication. Check labs monitor. - COMPLETE METABOLIC PANEL WITH GFR  3. Anxiety state, unspecified - ALPRAZolam (XANAX) 0.25 MG tablet; Take 1 tablet (0.25 mg total) by mouth 2 (two) times daily as needed for anxiety.  Dispense: 30 tablet; Refill: 0  I will followup with her when we get the lab results. She will need routine follow up office visit in 6 months or sooner if needed.  Signed, [address] [place], [place], [facility] 04/17/2014 11:43 AM

## 2014-04-17 NOTE — Telephone Encounter (Signed)
Medication ok refilled per MBD

## 2014-04-18 LAB — CBC WITH DIFFERENTIAL/PLATELET
BASOS PCT: 0 % (ref 0–1)
Basophils Absolute: 0 10*3/uL (ref 0.0–0.1)
EOS ABS: 0.1 10*3/uL (ref 0.0–0.7)
Eosinophils Relative: 1 % (ref 0–5)
HCT: 35.9 % — ABNORMAL LOW (ref 36.0–46.0)
HEMOGLOBIN: 12.5 g/dL (ref 12.0–15.0)
Lymphocytes Relative: 35 % (ref 12–46)
Lymphs Abs: 2.8 10*3/uL (ref 0.7–4.0)
MCH: 30.1 pg (ref 26.0–34.0)
MCHC: 34.8 g/dL (ref 30.0–36.0)
MCV: 86.5 fL (ref 78.0–100.0)
MONOS PCT: 7 % (ref 3–12)
Monocytes Absolute: 0.6 10*3/uL (ref 0.1–1.0)
Neutro Abs: 4.5 10*3/uL (ref 1.7–7.7)
Neutrophils Relative %: 57 % (ref 43–77)
Platelets: 181 10*3/uL (ref 150–400)
RBC: 4.15 MIL/uL (ref 3.87–5.11)
RDW: 13.4 % (ref 11.5–15.5)
WBC: 7.9 10*3/uL (ref 4.0–10.5)

## 2014-04-21 ENCOUNTER — Encounter: Payer: Self-pay | Admitting: *Deleted

## 2014-05-12 ENCOUNTER — Other Ambulatory Visit: Payer: Self-pay | Admitting: Family Medicine

## 2014-05-12 NOTE — Telephone Encounter (Signed)
Ok to refill??  Last office visit/ refill 04/17/2014.

## 2014-05-13 NOTE — Telephone Encounter (Signed)
Approved for #30+0 

## 2014-05-13 NOTE — Telephone Encounter (Signed)
Medication called to pharmacy. 

## 2014-05-19 ENCOUNTER — Telehealth: Payer: Self-pay | Admitting: Physician Assistant

## 2014-05-19 NOTE — Telephone Encounter (Signed)
Patient is calling with questions regarding the xanax we prescribed for her, would like to know if she can take it daily  [phone]

## 2014-05-19 NOTE — Telephone Encounter (Signed)
Call placed to patient.   States that she is taking medication daily, and sometimes she needs to take it (2) times daily. Reports that quantity #30 was prescribed and she will run out early. Reports that she will contact office when she needs refill.   [facility] [address]

## 2014-05-30 ENCOUNTER — Telehealth: Payer: Self-pay | Admitting: Physician Assistant

## 2014-05-30 NOTE — Telephone Encounter (Signed)
Patient needs refill on xanax and [facility] [address]  [phone] if questions

## 2014-05-30 NOTE — Telephone Encounter (Signed)
Patient was seen last on 04/17/2014. Xanax was last filled on 05/13/2014. Please advise.

## 2014-06-02 NOTE — Telephone Encounter (Signed)
Approved for #30+2 additional refills. Tell patient that each prescription should last a full month--- that all of these should last 3 months. Will Not refill early.

## 2014-06-03 MED ORDER — ALPRAZOLAM 0.25 MG PO TABS: ORAL_TABLET | ORAL | Status: DC

## 2014-06-03 NOTE — Telephone Encounter (Signed)
Med phoned in °

## 2014-07-17 ENCOUNTER — Telehealth: Payer: Self-pay | Admitting: Physician Assistant

## 2014-07-17 NOTE — Telephone Encounter (Signed)
Patient is calling about the boil she has, would like to know if she can do anything to make this go away any quicker? Please call her at [phone]

## 2014-07-21 ENCOUNTER — Encounter: Payer: Self-pay | Admitting: Physician Assistant

## 2014-07-29 NOTE — Telephone Encounter (Signed)
Finally able to contact patient. She says area has resolved itself.

## 2014-09-01 ENCOUNTER — Ambulatory Visit: Payer: BC Managed Care – PPO | Admitting: *Deleted

## 2014-09-01 VITALS — BP 138/92

## 2014-09-01 DIAGNOSIS — I10 Essential (primary) hypertension: Secondary | ICD-10-CM

## 2014-09-02 ENCOUNTER — Other Ambulatory Visit: Payer: Self-pay | Admitting: Physician Assistant

## 2014-09-03 NOTE — Telephone Encounter (Signed)
Approved for #30+2 additional refills 

## 2014-09-03 NOTE — Telephone Encounter (Signed)
RX called in .

## 2014-09-03 NOTE — Telephone Encounter (Signed)
Ok to refill??  Last office visit 04/17/2014.  Last refill 06/03/2014, #2 refills.

## 2014-10-31 ENCOUNTER — Ambulatory Visit (HOSPITAL_COMMUNITY): Payer: Self-pay | Admitting: Psychiatry

## 2014-11-21 ENCOUNTER — Encounter (HOSPITAL_COMMUNITY): Payer: Self-pay | Admitting: Psychiatry

## 2014-11-21 ENCOUNTER — Encounter (INDEPENDENT_AMBULATORY_CARE_PROVIDER_SITE_OTHER): Payer: Self-pay

## 2014-11-21 ENCOUNTER — Ambulatory Visit (INDEPENDENT_AMBULATORY_CARE_PROVIDER_SITE_OTHER): Payer: 59 | Admitting: Psychiatry

## 2014-11-21 VITALS — BP 163/106 | HR 64 | Ht 67.0 in | Wt 145.0 lb

## 2014-11-21 DIAGNOSIS — F331 Major depressive disorder, recurrent, moderate: Secondary | ICD-10-CM

## 2014-11-21 MED ORDER — LAMOTRIGINE 25 MG PO TABS: ORAL_TABLET | ORAL | Status: DC

## 2014-11-21 MED ORDER — HYDROXYZINE PAMOATE 25 MG PO CAPS: ORAL_CAPSULE | ORAL | Status: DC
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
## 2014-12-12 ENCOUNTER — Other Ambulatory Visit: Payer: Self-pay | Admitting: Physician Assistant

## 2014-12-12 NOTE — Telephone Encounter (Signed)
Ok to refill??  Last office visit 04/17/2014.  Last refill 09/03/2014, #2 refills.

## 2014-12-15 NOTE — Telephone Encounter (Signed)
rx called in

## 2014-12-15 NOTE — Telephone Encounter (Signed)
ok 

## 2014-12-18 ENCOUNTER — Other Ambulatory Visit (HOSPITAL_COMMUNITY): Payer: Self-pay | Admitting: Psychiatry

## 2014-12-19 ENCOUNTER — Ambulatory Visit (HOSPITAL_COMMUNITY): Payer: Self-pay | Admitting: Psychiatry

## 2014-12-19 ENCOUNTER — Telehealth: Payer: Self-pay | Admitting: *Deleted

## 2014-12-19 MED ORDER — LOSARTAN POTASSIUM-HCTZ 50-12.5 MG PO TABS: 1.0000 | ORAL_TABLET | Freq: Every day | ORAL | Status: DC

## 2014-12-19 NOTE — Telephone Encounter (Signed)
Received call from patient.   Reports that she has been having increased sinus pressure and congestion x7 days and has been using OTC medications (DayQuil and Alka-Seltzer Sinus) to treat. States that her BP has been running higher, but today she noted that BP is 170/130 in L arm and 170/134 in R arm. Reports that she is being treated for HTN with Metoprolol 50mg  PO QD.    States that her BP has been an issues for a while, but last BP check noted in 08/2014. Only appt noted in 03/2014 with PA.   MD made aware and new orders obtained to stop metoprolol and begin Hyzaar 50/12.5mg . Also advised that patient needs OV next week to F/U. Call placed to patient who was made aware. Requested appointment with MD. Appointment scheduled.

## 2014-12-22 ENCOUNTER — Other Ambulatory Visit (HOSPITAL_COMMUNITY): Payer: Self-pay

## 2014-12-22 DIAGNOSIS — F331 Major depressive disorder, recurrent, moderate: Secondary | ICD-10-CM

## 2014-12-22 MED ORDER — LAMOTRIGINE 25 MG PO TABS: ORAL_TABLET | ORAL | Status: DC

## 2014-12-22 NOTE — Telephone Encounter (Signed)
Patient left a message she will be running out of Lamictal tapered dosage of 50 mg on tomorrow (12/23/14) but does not return to see Dr. Lolly MustacheArfeen until 12/26/14.  Patient would like a refill of medication or at least enough until returns for evaluation on 12/26/14.

## 2014-12-22 NOTE — Telephone Encounter (Signed)
Telephone message left for patient to inform Dr. Lolly MustacheArfeen authorized her refill of Lamictal 25mg  taking 2 daily currently.  Dr. Lolly MustacheArfeen instructed to go ahead and give patient 60 pills with changed order instructions to 2 per day.  Patient to keep appointment 12/26/14 and [facility] [address] today as ordered by Dr. Lolly MustacheArfeen.

## 2014-12-26 ENCOUNTER — Encounter (HOSPITAL_COMMUNITY): Payer: Self-pay | Admitting: Psychiatry

## 2014-12-26 ENCOUNTER — Ambulatory Visit (INDEPENDENT_AMBULATORY_CARE_PROVIDER_SITE_OTHER): Payer: 59 | Admitting: Psychiatry

## 2014-12-26 ENCOUNTER — Encounter: Payer: Self-pay | Admitting: Family Medicine

## 2014-12-26 ENCOUNTER — Ambulatory Visit (INDEPENDENT_AMBULATORY_CARE_PROVIDER_SITE_OTHER): Payer: 59 | Admitting: Family Medicine

## 2014-12-26 VITALS — BP 132/94 | HR 80 | Temp 98.1°F | Resp 14 | Ht 67.0 in | Wt 142.0 lb

## 2014-12-26 VITALS — BP 138/98 | HR 89 | Ht 67.0 in | Wt 142.4 lb

## 2014-12-26 DIAGNOSIS — F331 Major depressive disorder, recurrent, moderate: Secondary | ICD-10-CM | POA: Diagnosis not present

## 2014-12-26 DIAGNOSIS — I10 Essential (primary) hypertension: Secondary | ICD-10-CM

## 2014-12-26 LAB — COMPLETE METABOLIC PANEL WITH GFR
ALBUMIN: 4.3 g/dL (ref 3.5–5.2)
ALK PHOS: 41 U/L (ref 39–117)
ALT: 17 U/L (ref 0–35)
AST: 18 U/L (ref 0–37)
BILIRUBIN TOTAL: 0.6 mg/dL (ref 0.2–1.2)
BUN: 15 mg/dL (ref 6–23)
CO2: 24 mEq/L (ref 19–32)
Calcium: 9.2 mg/dL (ref 8.4–10.5)
Chloride: 102 mEq/L (ref 96–112)
Creat: 0.79 mg/dL (ref 0.50–1.10)
GFR, Est African American: >89 mL/min
GFR, Est Non African American: >89 mL/min
GLUCOSE: 91 mg/dL (ref 70–99)
POTASSIUM: 3.7 meq/L (ref 3.5–5.3)
SODIUM: 137 meq/L (ref 135–145)
Total Protein: 7 g/dL (ref 6.0–8.3)

## 2014-12-26 LAB — LIPID PANEL
CHOL/HDL RATIO: 3.2 ratio
Cholesterol: 131 mg/dL (ref 0–200)
HDL: 41 mg/dL — ABNORMAL LOW (ref >=46)
LDL CALC: 80 mg/dL (ref 0–99)
Triglycerides: 51 mg/dL (ref <150)
VLDL: 10 mg/dL (ref 0–40)

## 2014-12-26 MED ORDER — LAMOTRIGINE 100 MG PO TABS: 100.0000 mg | ORAL_TABLET | Freq: Every day | ORAL | Status: DC
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
## 2014-12-26 NOTE — Progress Notes (Signed)
Subjective:    Patient ID: Amanda Strong, female    DOB: 1981/04/10, 34 y.o.   MRN: [id number]  HPI Please see telephone note from last week. Patient has a history of hypertension. She was on metoprolol 50 mg a day. Her blood pressure was 170 systolic and higher. At that time we discontinued atenolol and switch the patient to Hyzaar 50/12.5 one by mouth daily. Her blood pressure today is much better at 132/94. Her diastolic blood pressures are still elevated. She does complain of occasional palpitations since discontinuing the metoprolol but they're tolerable. Past Medical History  Diagnosis Date  . Acne   . Hypertension   . Allergy   . Depression   . Hyperthyroidism   . History of chicken pox   . Abnormal Pap smear   . Normal pregnancy, repeat 10/28/2012  . Group B streptococcal infection in pregnancy 10/28/2012  . Anxiety    Past Surgical History  Procedure Laterality Date  . Wisdom tooth extraction    . Bunionectomy    . Foot surgery     Current Outpatient Prescriptions on File Prior to Visit  Medication Sig Dispense Refill  . ALPRAZolam (XANAX) 0.25 MG tablet TAKE 1 TABLET BY MOUTH TWICE A DAY AS NEEDED, MUST LAST 30 DAYS, NO EARLY REFILLS 30 tablet 2  . hydrOXYzine (VISTARIL) 25 MG capsule Take 1-2 capsule as needed for insomnia 30 capsule 0  . lamoTRIgine (LAMICTAL) 100 MG tablet Take 1 tablet (100 mg total) by mouth daily. 30 tablet 0  . losartan-hydrochlorothiazide (HYZAAR) 50-12.5 MG per tablet Take 1 tablet by mouth daily. 30 tablet 3  . [DISCONTINUED] clonazePAM (KLONOPIN) 0.5 MG tablet Take 1 tablet (0.5 mg total) by mouth 2 (two) times daily. 60 tablet 5  . [DISCONTINUED] desvenlafaxine (PRISTIQ) 50 MG 24 hr tablet Take 1 tablet (50 mg total) by mouth daily. 21 tablet 0  . [DISCONTINUED] olmesartan-hydrochlorothiazide (BENICAR HCT) 20-12.5 MG per tablet Take 1 tablet by mouth daily.       No current facility-administered medications on file prior to visit.    Allergies  Allergen Reactions  . Cephalexin     REACTION: intense itching all over  . Percocet [Oxycodone-Acetaminophen] Itching   History   Social History  . Marital Status: Single    Spouse Name: N/A  . Number of Children: 1  . Years of Education: 16   Occupational History  . WOUND CARE NURSES [employer]   Social History Main Topics  . Smoking status: Former Smoker    Quit date: 10/22/2008  . Smokeless tobacco: Never Used  . Alcohol Use: 0.6 oz/week    0 Standard drinks or equivalent, 1 Shots of liquor per week     Comment: occasionally  . Drug Use: Not on file  . Sexual Activity: Yes   Other Topics Concern  . Not on file   Social History Narrative   Caffeine Use-yes   Regular exercise-no      Entered 03/2014:   Single Mom of 2 children   Children are:      Girl--18 months old      Boy--34 years old   She works at [employer].    Currently works with wound care      Review of Systems  All other systems reviewed and are negative.      Objective:   Physical Exam  Cardiovascular: Normal rate, regular rhythm and normal heart sounds.   No murmur heard. Pulmonary/Chest: Effort normal  and breath sounds normal. No respiratory distress. She has no wheezes. She has no rales.  Abdominal: Soft. Bowel sounds are normal.  Vitals reviewed.         Assessment & Plan:  Benign essential HTN - Plan: COMPLETE METABOLIC PANEL WITH GFR, Lipid panel  [name] blood pressure is much improved. I would like to get an additional week to take effect. In a week, if her blood pressure is still elevated diastolic, I would increase Hyzaar to 100/25 by mouth daily. However she continues to have palpitations week and also add back Toprol-XL 25 mg by mouth daily. We will make that determination in one week after she reports her blood pressures at that time.

## 2014-12-29 ENCOUNTER — Encounter: Payer: Self-pay | Admitting: Family Medicine

## 2015-01-06 ENCOUNTER — Telehealth (HOSPITAL_COMMUNITY): Payer: Self-pay | Admitting: *Deleted

## 2015-01-06 ENCOUNTER — Other Ambulatory Visit: Payer: Self-pay | Admitting: Family Medicine

## 2015-01-06 ENCOUNTER — Other Ambulatory Visit (HOSPITAL_COMMUNITY): Payer: Self-pay | Admitting: Psychiatry

## 2015-01-06 MED ORDER — LOSARTAN POTASSIUM-HCTZ 50-12.5 MG PO TABS: 1.0000 | ORAL_TABLET | Freq: Every day | ORAL | Status: DC

## 2015-01-06 NOTE — Telephone Encounter (Signed)
Med sent to pharm 

## 2015-01-06 NOTE — Telephone Encounter (Signed)
Patient request 90 day supply of Lamictal.  Patient last seen 12-26-14.  Patient due to follow up 02-06-15.  Is it okay to do a 90 day?  Thank you

## 2015-01-06 NOTE — Telephone Encounter (Signed)
Not now because we are still adjusting her medication.

## 2015-01-16 ENCOUNTER — Other Ambulatory Visit (HOSPITAL_COMMUNITY): Payer: Self-pay | Admitting: Psychiatry

## 2015-01-16 NOTE — Telephone Encounter (Signed)
She was given a new prescription for higher dose.

## 2015-01-23 ENCOUNTER — Ambulatory Visit (HOSPITAL_COMMUNITY): Payer: Self-pay | Admitting: Psychology

## 2015-02-02 ENCOUNTER — Other Ambulatory Visit (HOSPITAL_COMMUNITY): Payer: Self-pay | Admitting: Psychiatry

## 2015-02-06 ENCOUNTER — Ambulatory Visit (INDEPENDENT_AMBULATORY_CARE_PROVIDER_SITE_OTHER): Payer: 59 | Admitting: Psychiatry

## 2015-02-06 ENCOUNTER — Encounter (HOSPITAL_COMMUNITY): Payer: Self-pay | Admitting: Psychiatry

## 2015-02-06 VITALS — BP 132/84 | HR 80 | Ht 67.0 in | Wt 142.4 lb

## 2015-02-06 DIAGNOSIS — F3131 Bipolar disorder, current episode depressed, mild: Secondary | ICD-10-CM

## 2015-02-06 DIAGNOSIS — F319 Bipolar disorder, unspecified: Secondary | ICD-10-CM | POA: Diagnosis not present

## 2015-02-06 DIAGNOSIS — F331 Major depressive disorder, recurrent, moderate: Secondary | ICD-10-CM

## 2015-02-06 MED ORDER — LAMOTRIGINE 150 MG PO TABS: 150.0000 mg | ORAL_TABLET | Freq: Every day | ORAL | Status: DC

## 2015-02-06 MED ORDER — CLONAZEPAM 0.5 MG PO TABS: ORAL_TABLET | ORAL | Status: DC
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
## 2015-03-02 ENCOUNTER — Other Ambulatory Visit (HOSPITAL_COMMUNITY): Payer: Self-pay | Admitting: Psychiatry

## 2015-03-02 NOTE — Telephone Encounter (Signed)
Medication refill request - patient called back after making an appointment for 04/24/15 first available and now would like to have medications refilled.  Requests Dr. Lolly Mustache refill medications until she can be seen on 04/24/15.

## 2015-03-02 NOTE — Telephone Encounter (Signed)
Telephone call with patient to follow up with requested medication refills and concern patient never scheduled follow up with Dr. Lolly Mustache after last evaluation on 02/06/15 and was to return in 4 weeks.  Patient stated a problem setting up an appointment with her work schedule so never rescheduled.  Patient agreed to go ahead and set up a follow up appointment for first available as this nurse informed patient Dr. Lolly Mustache is scheduled out over a month at this time.  Agreed to assist patient with requesting medication refills once she reschedules her next evaluation with Dr. Lolly Mustache.

## 2015-03-03 ENCOUNTER — Telehealth (HOSPITAL_COMMUNITY): Payer: Self-pay

## 2015-03-03 DIAGNOSIS — F3131 Bipolar disorder, current episode depressed, mild: Secondary | ICD-10-CM

## 2015-03-03 NOTE — Telephone Encounter (Signed)
Telephone call with patient after she left another message today stating she had to reschedule her appointment again for August now on the 19th instead of 04/24/15 due to her work schedule.  Patient wanted to verify she had a new medication order provided.  Informed patient Dr. Lolly Mustache sent a 30 day order for her Lamictal only so would let him know she would need at least one additional refill prior to her appointment now scheduled for 05/08/15.  Informed patient in the coming month if going to run out of medications to make sure to call in refill requests 4-5 days ahead of time and encouraged patient to keep her appointment on 05/08/15.

## 2015-03-06 MED ORDER — LAMOTRIGINE 150 MG PO TABS: 150.0000 mg | ORAL_TABLET | Freq: Every day | ORAL | Status: DC

## 2015-03-06 NOTE — Telephone Encounter (Signed)
Met with Dr. Adele Schilder who authorized a one time more refill of patient's requested Lamictal to be available to last her until appointment now set for 05/08/15.  Telephone message left for patient to inform she has another order for Lamictal she can use when needed in July until patient returns to see Dr. Adele Schilder in August. Informed patient on message she would need to keep that appointment for any further refills.

## 2015-03-12 ENCOUNTER — Other Ambulatory Visit: Payer: Self-pay | Admitting: Family Medicine

## 2015-03-12 NOTE — Telephone Encounter (Signed)
rx called in

## 2015-03-12 NOTE — Telephone Encounter (Signed)
?   OK to Refill  

## 2015-03-12 NOTE — Telephone Encounter (Signed)
ok 

## 2015-03-26 ENCOUNTER — Other Ambulatory Visit: Payer: Self-pay | Admitting: Obstetrics and Gynecology

## 2015-03-26 DIAGNOSIS — R928 Other abnormal and inconclusive findings on diagnostic imaging of breast: Secondary | ICD-10-CM

## 2015-04-03 ENCOUNTER — Other Ambulatory Visit: Payer: Self-pay

## 2015-04-03 ENCOUNTER — Other Ambulatory Visit (HOSPITAL_COMMUNITY): Payer: Self-pay | Admitting: Psychiatry

## 2015-04-03 NOTE — Telephone Encounter (Signed)
[facility] to verify patient still has a one time refill of lamictal order there on file so request today declined.

## 2015-04-09 ENCOUNTER — Other Ambulatory Visit (HOSPITAL_COMMUNITY): Payer: Self-pay | Admitting: Psychiatry

## 2015-04-09 DIAGNOSIS — F3131 Bipolar disorder, current episode depressed, mild: Secondary | ICD-10-CM

## 2015-04-09 NOTE — Telephone Encounter (Signed)
Medication refill request.  Patient called and stated need for a new Lamictal order as she had to change her appointment to 05/08/15 due to work schedule.  Dr. Lolly Mustache contacted and gave telephone order to fill medication one time. New one time Lamictal order [facility] [address].

## 2015-04-10 ENCOUNTER — Encounter: Payer: Self-pay | Admitting: Family Medicine

## 2015-04-24 ENCOUNTER — Telehealth (HOSPITAL_COMMUNITY): Payer: Self-pay | Admitting: *Deleted

## 2015-04-24 ENCOUNTER — Ambulatory Visit (HOSPITAL_COMMUNITY): Payer: Self-pay | Admitting: Psychiatry

## 2015-04-24 NOTE — Telephone Encounter (Signed)
Called patient back after speaking with Dr. Donell Beers (Dr. Lolly Mustache is not in the office today). Pt did not answer but asked that a message be left on return call because she was working today and would not be able to answer. Explained that she can take benadryl as needed for the itching. Also that she needs to monitor the extent of itching and if a rash develops to be seen in the ER/urent care settting. Pt to call back next week when Dr. Lolly Mustache will be back.

## 2015-04-24 NOTE — Telephone Encounter (Signed)
Patient called left a message that she was itching all over and has 1 dry patch on her leg that could be rash. Called patient back 04/24/15 at 1041 to get further details. Pt states that the itching is tolerable but not something she really wants to deal with. States that the place on her leg is like a dry spot and not really a rash. It has not spread and no other places have been noticed. The itching started about 6 days ago. She states that she has had the itching in the past when increasing her lamictal but she doesn't want people thinking something is wrong with her since she is so itchy. Will notify MD.

## 2015-04-27 ENCOUNTER — Telehealth (HOSPITAL_COMMUNITY): Payer: Self-pay | Admitting: Psychiatry

## 2015-04-27 NOTE — Telephone Encounter (Signed)
I returned patient's phone call.  Her itching is almost resolved.  She has no rash.  She wants to continue her current medication.  Reassurance given.  Patient is scheduled to see on August 19.

## 2015-05-08 ENCOUNTER — Ambulatory Visit (HOSPITAL_COMMUNITY): Payer: Self-pay | Admitting: Psychiatry

## 2015-05-08 ENCOUNTER — Ambulatory Visit
Admission: RE | Admit: 2015-05-08 | Discharge: 2015-05-08 | Disposition: A | Discharge status: Home / Self Care | Payer: 59 | Source: Ambulatory Visit | Attending: Obstetrics and Gynecology | Admitting: Obstetrics and Gynecology

## 2015-05-08 DIAGNOSIS — R928 Other abnormal and inconclusive findings on diagnostic imaging of breast: Secondary | ICD-10-CM

## 2015-05-29 ENCOUNTER — Ambulatory Visit (INDEPENDENT_AMBULATORY_CARE_PROVIDER_SITE_OTHER): Payer: 59 | Admitting: Psychiatry

## 2015-05-29 ENCOUNTER — Encounter (HOSPITAL_COMMUNITY): Payer: Self-pay | Admitting: Psychiatry

## 2015-05-29 VITALS — BP 127/79 | HR 72 | Ht 67.0 in | Wt 145.0 lb

## 2015-05-29 DIAGNOSIS — F331 Major depressive disorder, recurrent, moderate: Secondary | ICD-10-CM

## 2015-05-29 DIAGNOSIS — F3131 Bipolar disorder, current episode depressed, mild: Secondary | ICD-10-CM

## 2015-05-29 MED ORDER — ALPRAZOLAM 0.5 MG PO TABS: 0.5000 mg | ORAL_TABLET | Freq: Every day | ORAL | Status: DC | PRN

## 2015-05-29 MED ORDER — LAMOTRIGINE 200 MG PO TABS: 200.0000 mg | ORAL_TABLET | Freq: Every day | ORAL | Status: DC
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
## 2015-06-05 ENCOUNTER — Encounter: Payer: Self-pay | Admitting: Family Medicine

## 2015-07-07 ENCOUNTER — Other Ambulatory Visit (HOSPITAL_COMMUNITY): Payer: Self-pay | Admitting: Psychiatry

## 2015-07-10 ENCOUNTER — Ambulatory Visit (INDEPENDENT_AMBULATORY_CARE_PROVIDER_SITE_OTHER): Payer: 59 | Admitting: Psychiatry

## 2015-07-10 ENCOUNTER — Encounter (HOSPITAL_COMMUNITY): Payer: Self-pay | Admitting: Psychiatry

## 2015-07-10 VITALS — BP 124/88 | HR 76 | Ht 67.0 in | Wt 145.0 lb

## 2015-07-10 DIAGNOSIS — F331 Major depressive disorder, recurrent, moderate: Secondary | ICD-10-CM | POA: Diagnosis not present

## 2015-07-10 DIAGNOSIS — F3131 Bipolar disorder, current episode depressed, mild: Secondary | ICD-10-CM

## 2015-07-10 MED ORDER — LAMOTRIGINE 200 MG PO TABS: 200.0000 mg | ORAL_TABLET | Freq: Every day | ORAL | Status: DC
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
## 2015-07-27 ENCOUNTER — Other Ambulatory Visit (HOSPITAL_COMMUNITY): Payer: Self-pay | Admitting: Psychiatry

## 2015-08-05 ENCOUNTER — Telehealth (HOSPITAL_COMMUNITY): Payer: Self-pay

## 2015-08-05 NOTE — Telephone Encounter (Signed)
Telephone message left for patient after she left one stating she was in need of medication.  Left patient a message to question which medication she was in need of filling as it appears a new order for her Alprazolam was printed out on 07/28/15.  Requested patient call this nurse back to clarify what she is in need of to assist.

## 2015-08-10 ENCOUNTER — Telehealth (HOSPITAL_COMMUNITY): Payer: Self-pay

## 2015-08-10 NOTE — Telephone Encounter (Signed)
Telephone call with Wilkie AyeKristy, [facility] [address] after patient left a message she was in need of a new Lamictal order.  Verified with pharmacist patient still has one refill on file for her Lamictal 200mg  and they are filling for patient.  Informed patient with call they had one refill for patient's Lamictal still [facility] and verified they are filling that medication now.  Patient agreed to call back if any further problems getting her medication filled.

## 2015-08-17 ENCOUNTER — Encounter: Payer: Self-pay | Admitting: Family Medicine

## 2015-08-17 DIAGNOSIS — IMO0002 Reserved for concepts with insufficient information to code with codable children: Secondary | ICD-10-CM | POA: Insufficient documentation

## 2015-09-02 ENCOUNTER — Ambulatory Visit (INDEPENDENT_AMBULATORY_CARE_PROVIDER_SITE_OTHER): Payer: 59 | Admitting: Psychiatry

## 2015-09-02 ENCOUNTER — Encounter (HOSPITAL_COMMUNITY): Payer: Self-pay | Admitting: Psychiatry

## 2015-09-02 VITALS — BP 130/88 | HR 70 | Ht 67.0 in | Wt 146.2 lb

## 2015-09-02 DIAGNOSIS — F3131 Bipolar disorder, current episode depressed, mild: Secondary | ICD-10-CM

## 2015-09-02 DIAGNOSIS — F329 Major depressive disorder, single episode, unspecified: Secondary | ICD-10-CM | POA: Diagnosis not present

## 2015-09-02 DIAGNOSIS — F411 Generalized anxiety disorder: Secondary | ICD-10-CM

## 2015-09-02 MED ORDER — LAMOTRIGINE 200 MG PO TABS: 200.0000 mg | ORAL_TABLET | Freq: Every day | ORAL | Status: DC

## 2015-09-02 MED ORDER — ESCITALOPRAM OXALATE 20 MG PO TABS: 20.0000 mg | ORAL_TABLET | Freq: Every day | ORAL | Status: DC
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
## 2015-09-07 ENCOUNTER — Other Ambulatory Visit (HOSPITAL_COMMUNITY): Payer: Self-pay | Admitting: Psychiatry

## 2015-09-07 DIAGNOSIS — F411 Generalized anxiety disorder: Secondary | ICD-10-CM

## 2015-09-08 NOTE — Telephone Encounter (Signed)
Met with Dr.Taylor helping to cover for Dr. Salem Senate out this date as he approved a one time refill of patient's Xanax which was continued by Dr. Salem Senate at evaluation on 09/02/15.  Patient returns for next evaluation on 09/23/15.  Called in one time order to patient's [facility] on [address] with [employer], Software engineer.

## 2015-09-23 ENCOUNTER — Ambulatory Visit (HOSPITAL_COMMUNITY): Payer: Self-pay | Admitting: Psychiatry

## 2015-10-15 ENCOUNTER — Ambulatory Visit (INDEPENDENT_AMBULATORY_CARE_PROVIDER_SITE_OTHER): Payer: 59 | Admitting: Psychiatry

## 2015-10-15 ENCOUNTER — Encounter (HOSPITAL_COMMUNITY): Payer: Self-pay | Admitting: Psychiatry

## 2015-10-15 VITALS — BP 122/74 | HR 84 | Ht 67.0 in | Wt 148.8 lb

## 2015-10-15 DIAGNOSIS — F3131 Bipolar disorder, current episode depressed, mild: Secondary | ICD-10-CM

## 2015-10-15 DIAGNOSIS — F411 Generalized anxiety disorder: Secondary | ICD-10-CM

## 2015-10-15 DIAGNOSIS — F331 Major depressive disorder, recurrent, moderate: Secondary | ICD-10-CM

## 2015-10-15 MED ORDER — ALPRAZOLAM 0.5 MG PO TABS: ORAL_TABLET | ORAL | Status: DC

## 2015-10-15 MED ORDER — LAMOTRIGINE 200 MG PO TABS: 200.0000 mg | ORAL_TABLET | Freq: Every day | ORAL | Status: DC
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
## 2015-10-16 ENCOUNTER — Ambulatory Visit (HOSPITAL_COMMUNITY): Payer: Self-pay | Admitting: Psychiatry

## 2015-10-19 ENCOUNTER — Telehealth: Payer: Self-pay | Admitting: *Deleted

## 2015-10-19 ENCOUNTER — Other Ambulatory Visit: Payer: Self-pay | Admitting: Anesthesiology

## 2015-10-19 DIAGNOSIS — M25511 Pain in right shoulder: Secondary | ICD-10-CM

## 2015-10-19 NOTE — Telephone Encounter (Signed)
Amanda Strong states [name] would like to order a MRI and pt states she has metal in her ankle.  I reviewed pt's [facility] pt identifier and she is a [name] pt and the record is in storage.  I spoke with [name] he stated the metal in the pt's ankle would most likely be stainless steel.  I informed Amanda Strong of the findings and she request the records be faxed to their office - Attn:  Amanda Strong [phone].

## 2015-10-23 ENCOUNTER — Other Ambulatory Visit: Payer: Self-pay

## 2015-10-23 ENCOUNTER — Ambulatory Visit
Admission: RE | Admit: 2015-10-23 | Discharge: 2015-10-23 | Disposition: A | Discharge status: Home / Self Care | Payer: 59 | Source: Ambulatory Visit | Attending: Anesthesiology | Admitting: Anesthesiology

## 2015-10-23 ENCOUNTER — Other Ambulatory Visit: Payer: Self-pay | Admitting: Anesthesiology

## 2015-10-23 DIAGNOSIS — R29898 Other symptoms and signs involving the musculoskeletal system: Secondary | ICD-10-CM

## 2015-10-23 DIAGNOSIS — M5416 Radiculopathy, lumbar region: Secondary | ICD-10-CM

## 2015-10-23 DIAGNOSIS — M25511 Pain in right shoulder: Secondary | ICD-10-CM

## 2015-11-14 ENCOUNTER — Other Ambulatory Visit (HOSPITAL_COMMUNITY): Payer: Self-pay | Admitting: Psychiatry

## 2015-12-16 ENCOUNTER — Ambulatory Visit (INDEPENDENT_AMBULATORY_CARE_PROVIDER_SITE_OTHER): Payer: 59 | Admitting: Psychiatry

## 2015-12-16 ENCOUNTER — Encounter (HOSPITAL_COMMUNITY): Payer: Self-pay | Admitting: Psychiatry

## 2015-12-16 VITALS — BP 141/89 | HR 71 | Ht 67.0 in | Wt 141.8 lb

## 2015-12-16 DIAGNOSIS — F3131 Bipolar disorder, current episode depressed, mild: Secondary | ICD-10-CM

## 2015-12-16 MED ORDER — LAMOTRIGINE 200 MG PO TABS: 200.0000 mg | ORAL_TABLET | Freq: Every day | ORAL | Status: DC
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
## 2015-12-19 ENCOUNTER — Other Ambulatory Visit: Payer: Self-pay | Admitting: Family Medicine

## 2015-12-30 ENCOUNTER — Other Ambulatory Visit (HOSPITAL_COMMUNITY): Payer: Self-pay | Admitting: Psychiatry

## 2016-01-08 ENCOUNTER — Other Ambulatory Visit (HOSPITAL_COMMUNITY): Payer: Self-pay | Admitting: Psychiatry

## 2016-01-08 ENCOUNTER — Telehealth (HOSPITAL_COMMUNITY): Payer: Self-pay

## 2016-01-08 DIAGNOSIS — F411 Generalized anxiety disorder: Secondary | ICD-10-CM

## 2016-01-08 MED ORDER — ALPRAZOLAM 0.5 MG PO TABS: ORAL_TABLET | ORAL | Status: DC

## 2016-01-08 NOTE — Telephone Encounter (Signed)
Called in the prescription of Xanax and called patient to let her know

## 2016-01-08 NOTE — Telephone Encounter (Signed)
Patient calling, she said that she thought that you called in her xanax when she was here in March, She would like a refill, please advise and thank you

## 2016-01-11 ENCOUNTER — Other Ambulatory Visit (HOSPITAL_COMMUNITY): Payer: Self-pay | Admitting: Psychiatry

## 2016-01-24 ENCOUNTER — Other Ambulatory Visit: Payer: Self-pay | Admitting: Family Medicine

## 2016-01-25 NOTE — Telephone Encounter (Signed)
Refill appropriate and filled per protocol. 

## 2016-02-04 ENCOUNTER — Encounter (HOSPITAL_COMMUNITY): Payer: Self-pay | Admitting: Clinical

## 2016-02-04 ENCOUNTER — Ambulatory Visit (INDEPENDENT_AMBULATORY_CARE_PROVIDER_SITE_OTHER): Payer: 59 | Admitting: Clinical

## 2016-02-04 DIAGNOSIS — F411 Generalized anxiety disorder: Secondary | ICD-10-CM

## 2016-02-04 DIAGNOSIS — F3131 Bipolar disorder, current episode depressed, mild: Secondary | ICD-10-CM | POA: Diagnosis not present
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
## 2016-02-24 ENCOUNTER — Other Ambulatory Visit: Payer: Self-pay | Admitting: Family Medicine

## 2016-02-25 ENCOUNTER — Ambulatory Visit (HOSPITAL_COMMUNITY): Payer: Self-pay | Admitting: Clinical

## 2016-03-03 ENCOUNTER — Other Ambulatory Visit (HOSPITAL_COMMUNITY): Payer: Self-pay | Admitting: Psychiatry

## 2016-03-07 ENCOUNTER — Other Ambulatory Visit (HOSPITAL_COMMUNITY): Payer: Self-pay | Admitting: Psychiatry

## 2016-03-07 DIAGNOSIS — F411 Generalized anxiety disorder: Secondary | ICD-10-CM

## 2016-03-07 MED ORDER — ALPRAZOLAM 0.5 MG PO TABS: ORAL_TABLET | ORAL | Status: DC

## 2016-03-25 ENCOUNTER — Encounter: Payer: Self-pay | Admitting: Family Medicine

## 2016-03-25 ENCOUNTER — Ambulatory Visit (INDEPENDENT_AMBULATORY_CARE_PROVIDER_SITE_OTHER): Payer: 59 | Admitting: Family Medicine

## 2016-03-25 VITALS — BP 138/70 | HR 82 | Temp 98.5°F | Resp 16 | Ht 67.0 in | Wt 142.0 lb

## 2016-03-25 DIAGNOSIS — I10 Essential (primary) hypertension: Secondary | ICD-10-CM

## 2016-03-25 DIAGNOSIS — K219 Gastro-esophageal reflux disease without esophagitis: Secondary | ICD-10-CM

## 2016-03-25 DIAGNOSIS — R5383 Other fatigue: Secondary | ICD-10-CM | POA: Diagnosis not present

## 2016-03-25 MED ORDER — OMEPRAZOLE 40 MG PO CPDR: 40.0000 mg | DELAYED_RELEASE_CAPSULE | Freq: Every day | ORAL | Status: DC

## 2016-03-25 NOTE — Progress Notes (Signed)
Subjective:    Patient ID: Amanda Strong, female    DOB: 10-Oct-1980, 35 y.o.   MRN: [id number]  Gastroesophageal Reflux   Patient is a very pleasant 35 year old African-American female here today for follow-up of her hypertension. Her blood pressures well controlled today at 138/70. She denies any side effects of her medications. [name] due to recheck a CMP to monitor her potassium as well as her kidney function. She does complain of fatigue and would like her thyroid checked. She also reports severe acid reflux. She reports feeling a burning sensation in her chest on a daily basis.  It is worse with position changes. She has tried changing her diet without any benefit. Past Medical History  Diagnosis Date  . Acne   . Hypertension   . Allergy   . Depression   . Hyperthyroidism   . History of chicken pox   . Abnormal Pap smear   . Normal pregnancy, repeat 10/28/2012  . Group B streptococcal infection in pregnancy 10/28/2012  . Anxiety   . LGSIL (low grade squamous intraepithelial dysplasia)     Dr. Senaida Oresichardson   Past Surgical History  Procedure Laterality Date  . Wisdom tooth extraction    . Bunionectomy    . Foot surgery     Current Outpatient Prescriptions on File Prior to Visit  Medication Sig Dispense Refill  . ALPRAZolam (XANAX) 0.5 MG tablet TAKE 1 TABLET EVERY DAY AS NEEDED ANXIETY 30 tablet 0  . lamoTRIgine (LAMICTAL) 200 MG tablet Take 1 tablet (200 mg total) by mouth daily. 30 tablet 2  . loratadine (CLARITIN) 10 MG tablet Take 10 mg by mouth daily.    Marland Kitchen. losartan-hydrochlorothiazide (HYZAAR) 50-12.5 MG tablet TAKE 1 TABLET BY MOUTH DAILY. 30 tablet 0  . [DISCONTINUED] desvenlafaxine (PRISTIQ) 50 MG 24 hr tablet Take 1 tablet (50 mg total) by mouth daily. 21 tablet 0  . [DISCONTINUED] olmesartan-hydrochlorothiazide (BENICAR HCT) 20-12.5 MG per tablet Take 1 tablet by mouth daily.       No current facility-administered medications on file prior to visit.   Allergies    Allergen Reactions  . Cephalexin     REACTION: intense itching all over  . Percocet [Oxycodone-Acetaminophen] Itching   Social History   Social History  . Marital Status: Single    Spouse Name: N/A  . Number of Children: 1  . Years of Education: 16   Occupational History  . WOUND CARE NURSES [employer]   Social History Main Topics  . Smoking status: Former Smoker    Quit date: 10/22/2008  . Smokeless tobacco: Never Used  . Alcohol Use: 0.6 oz/week    1 Shots of liquor, 0 Standard drinks or equivalent per week     Comment: occasionally  . Drug Use: Not on file  . Sexual Activity: Yes    Birth Control/ Protection: Condom, IUD   Other Topics Concern  . Not on file   Social History Narrative   Caffeine Use-yes   Regular exercise-no      Entered 03/2014:   Single Mom of 2 children   Children are:      Girl--18 months old      Boy--35 years old   She works at [employer].    Currently works with wound care      Review of Systems  All other systems reviewed and are negative.      Objective:   Physical Exam  Cardiovascular: Normal rate, regular rhythm and  normal heart sounds.   No murmur heard. Pulmonary/Chest: Effort normal and breath sounds normal. No respiratory distress. She has no wheezes. She has no rales.  Abdominal: Soft. Bowel sounds are normal.  Vitals reviewed.         Assessment & Plan:  Gastroesophageal reflux disease without esophagitis - Plan: omeprazole (PRILOSEC) 40 MG capsule  Benign essential HTN - Plan: COMPLETE METABOLIC PANEL WITH GFR  Other fatigue - Plan: TSH  Blood pressure is excellent. I will continue the same medications for this patient. I will monitor her renal function as well as her potassium. I will check a TSH given her fatigue. I started the patient on omeprazole 40 mg by mouth daily for acid reflux. I recommended she try the medication and if symptoms improve that she take it daily for 2 months  and then discontinue the medication to see if she needs to take the medication long-term or to see if her symptoms will spontaneously improve after a trial of therapy

## 2016-03-26 LAB — COMPLETE METABOLIC PANEL WITH GFR
ALT: 14 U/L (ref 6–29)
AST: 15 U/L (ref 10–30)
Albumin: 4.5 g/dL (ref 3.6–5.1)
Alkaline Phosphatase: 42 U/L (ref 33–115)
BILIRUBIN TOTAL: 0.4 mg/dL (ref 0.2–1.2)
BUN: 14 mg/dL (ref 7–25)
CHLORIDE: 102 mmol/L (ref 98–110)
CO2: 26 mmol/L (ref 20–31)
Calcium: 9.3 mg/dL (ref 8.6–10.2)
Creat: 1.05 mg/dL (ref 0.50–1.10)
GFR, EST NON AFRICAN AMERICAN: 69 mL/min (ref >=60)
GFR, Est African American: 80 mL/min (ref >=60)
GLUCOSE: 131 mg/dL — AB (ref 70–99)
POTASSIUM: 3.5 mmol/L (ref 3.5–5.3)
SODIUM: 137 mmol/L (ref 135–146)
TOTAL PROTEIN: 6.9 g/dL (ref 6.1–8.1)

## 2016-03-26 LAB — TSH: TSH: 0.6 mIU/L

## 2016-03-30 ENCOUNTER — Other Ambulatory Visit: Payer: Self-pay | Admitting: Family Medicine

## 2016-03-30 ENCOUNTER — Ambulatory Visit (INDEPENDENT_AMBULATORY_CARE_PROVIDER_SITE_OTHER): Payer: 59 | Admitting: Clinical

## 2016-03-30 DIAGNOSIS — F411 Generalized anxiety disorder: Secondary | ICD-10-CM

## 2016-03-30 DIAGNOSIS — F3131 Bipolar disorder, current episode depressed, mild: Secondary | ICD-10-CM

## 2016-03-31 NOTE — Telephone Encounter (Signed)
Refill appropriate and filled per protocol. 

## 2016-04-05 ENCOUNTER — Encounter (HOSPITAL_COMMUNITY): Payer: Self-pay | Admitting: Clinical

## 2016-04-05 NOTE — Progress Notes (Signed)
   THERAPIST PROGRESS NOTE  Session Time: 8:02  Participation Level: Active  Behavioral Response: CasualAlertDepressed  Type of Therapy: Individual Therapy  Treatment Goals addressed: improve psychiatric symptoms, emotional regulation, improve unhelpful thought patterns, learn about diagnosis, healthy coping skills,   Interventions: cbt, motivational interviewing, psychoeducation, grounding and mindfulness techniques  Summary: Amanda Strong is a 35 y.o. female who presents with Bipolar affective disorder, currently depressed, mild, and Generalized Anxiety Disorder.   Suicidal/Homicidal: Nowithout intent/plan  Therapist Response: Amanda Strong met with clinician for an individual session. Amanda Strong discussed her psychiatric symptoms, her current life events, and her goals for therapy. Client and clinician discussed her diagnosis and how her symptoms affect her life. Amanda Strong shared that she has a lot of stressors which increased her symptoms -  her job, her intimate relationship, and caring for her children. Amanda Strong was tearful when discussing her life and stresses. Clinician introduced some basic CBT concepts. Client and clinician discussed how our thoughts affect our emotions and our perceptions. Client and clinician discussed how one goes about challenging and changing thoughts. Clinician introduced grounding and mindfulness techniques. Client and clinician discussed the process purpose and practice of the techniques. Amanda Strong and clinician practice some of the techniques to gather. Clinician gave Amanda Strong a CBT homework packet which Amanda Strong agreed to complete an addition to practicing her grounding and mindfulness techniques daily until next session. Client and clinician agreed to discuss further how these techniques could be used to improve her emotions and psychiatric symptoms.    Plan: Return again in 1-2 weeks.  Diagnosis: Axis I: Bipolar affective disorder, currently depressed, mild, and  Generalized Anxiety Disorder.       Phillp Dolores A, LCSW 04/05/16

## 2016-04-08 ENCOUNTER — Ambulatory Visit (HOSPITAL_COMMUNITY): Payer: Self-pay | Admitting: Psychiatry

## 2016-04-11 ENCOUNTER — Other Ambulatory Visit (HOSPITAL_COMMUNITY): Payer: Self-pay | Admitting: Psychiatry

## 2016-04-11 DIAGNOSIS — F411 Generalized anxiety disorder: Secondary | ICD-10-CM

## 2016-04-13 ENCOUNTER — Other Ambulatory Visit (HOSPITAL_COMMUNITY): Payer: Self-pay

## 2016-04-13 DIAGNOSIS — F411 Generalized anxiety disorder: Secondary | ICD-10-CM

## 2016-04-13 MED ORDER — ALPRAZOLAM 0.5 MG PO TABS: ORAL_TABLET | ORAL | 0 refills | Status: DC

## 2016-04-14 ENCOUNTER — Other Ambulatory Visit (HOSPITAL_COMMUNITY): Payer: Self-pay | Admitting: Psychiatry

## 2016-04-14 DIAGNOSIS — F3131 Bipolar disorder, current episode depressed, mild: Secondary | ICD-10-CM

## 2016-04-19 ENCOUNTER — Other Ambulatory Visit (HOSPITAL_COMMUNITY): Payer: Self-pay

## 2016-04-19 DIAGNOSIS — F3131 Bipolar disorder, current episode depressed, mild: Secondary | ICD-10-CM

## 2016-04-19 MED ORDER — LAMOTRIGINE 200 MG PO TABS: 200.0000 mg | ORAL_TABLET | Freq: Every day | ORAL | 2 refills | Status: DC

## 2016-04-27 ENCOUNTER — Other Ambulatory Visit: Payer: Self-pay | Admitting: Family Medicine

## 2016-05-10 ENCOUNTER — Other Ambulatory Visit (HOSPITAL_COMMUNITY): Payer: Self-pay | Admitting: Psychiatry

## 2016-05-10 DIAGNOSIS — F411 Generalized anxiety disorder: Secondary | ICD-10-CM

## 2016-05-12 ENCOUNTER — Other Ambulatory Visit (HOSPITAL_COMMUNITY): Payer: Self-pay

## 2016-05-12 DIAGNOSIS — F411 Generalized anxiety disorder: Secondary | ICD-10-CM

## 2016-05-12 MED ORDER — ALPRAZOLAM 0.5 MG PO TABS: ORAL_TABLET | ORAL | 0 refills | Status: DC

## 2016-05-12 NOTE — Progress Notes (Signed)
Patient called for a refill on her Xanax, she has a follow up next month. Per protocol, I sent in one month to the pharmacy.

## 2016-05-20 IMAGING — MR MR CERVICAL SPINE W/O CM
4 of 5 series · 22 of 48 positions shown · non-contrast
Comparison: None.

CLINICAL DATA: 34-year-old female with pain radiating to the right
shoulder and all arm. Weakness. Radiculopathy. Initial encounter.

EXAM:
MRI CERVICAL SPINE WITHOUT CONTRAST
TECHNIQUE: Multiplanar, multisequence MR imaging of the cervical spine was
performed. No intravenous contrast was administered.

[Series 3: T2 · sagittal · 3.0mm · 0.41mm/px · 6 of 12 slices shown (1 of 3)]
[im 1/12]
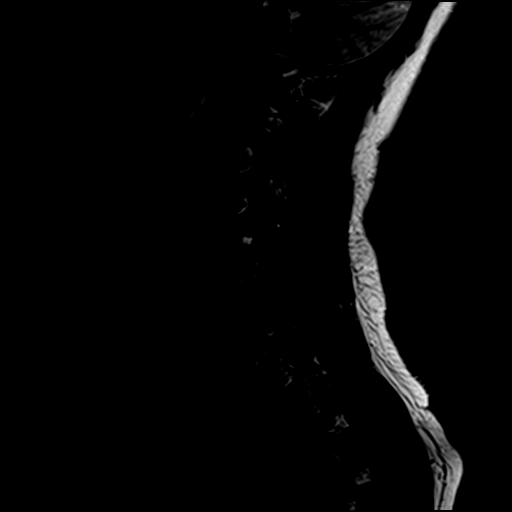
[im 3/12]
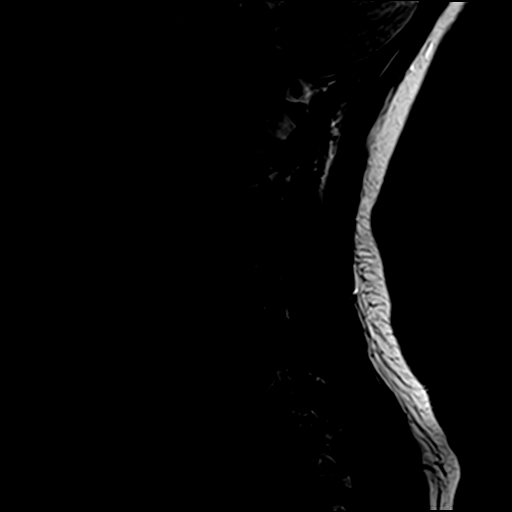
[im 5/12]
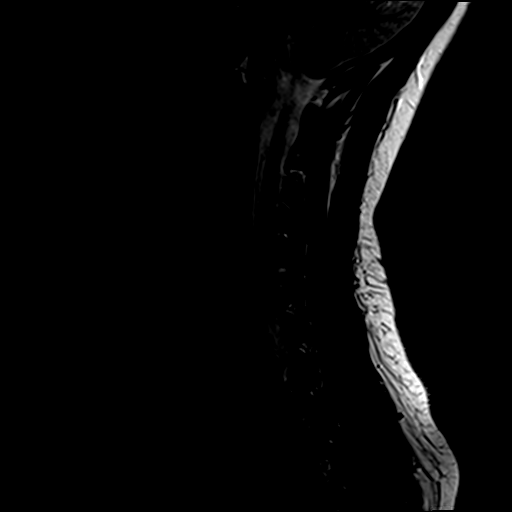
[im 7/12]
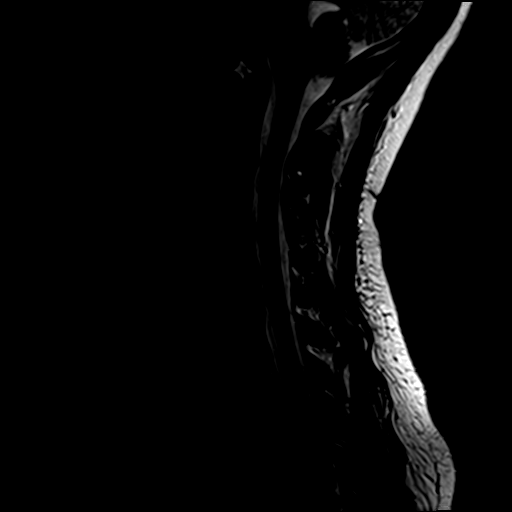
[im 9/12]
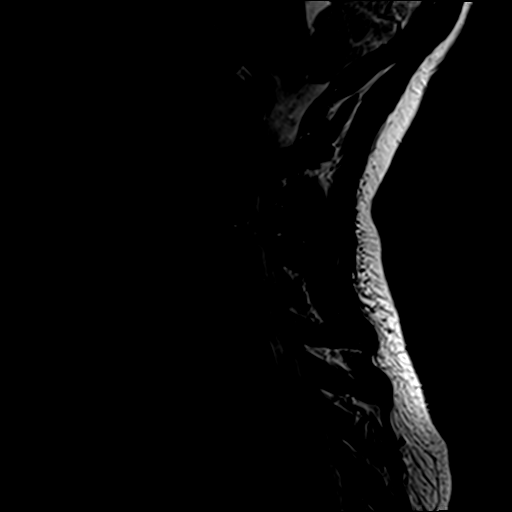
[im 12/12]
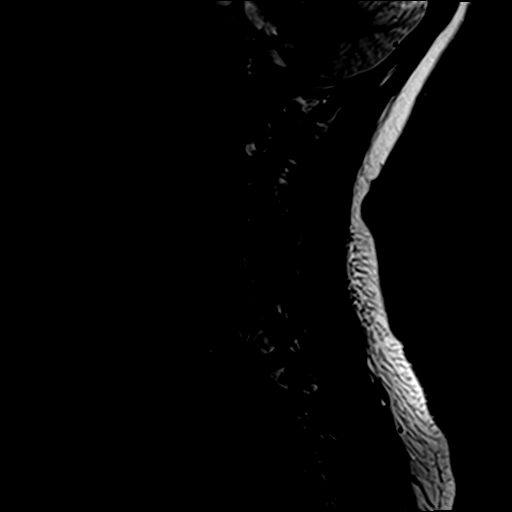

[Series 4: T1 · sagittal · 3.0mm · 0.41mm/px · 3 of 12 slices shown]
[im 2/12]
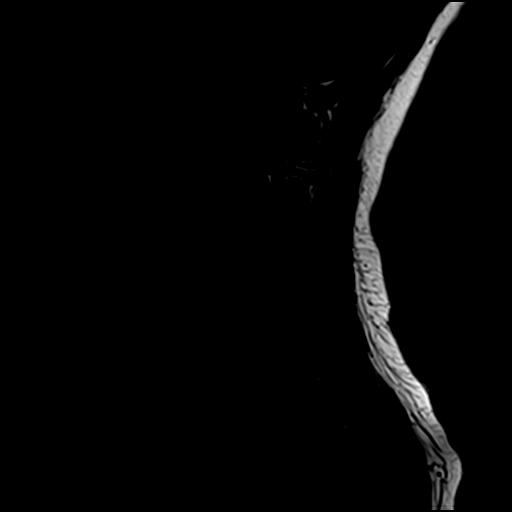
[im 6/12]
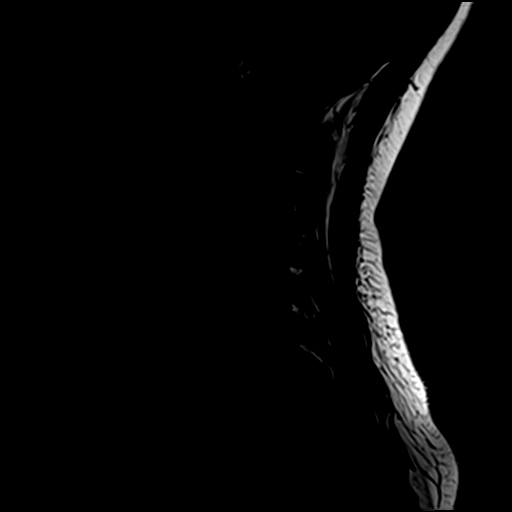
[im 10/12]
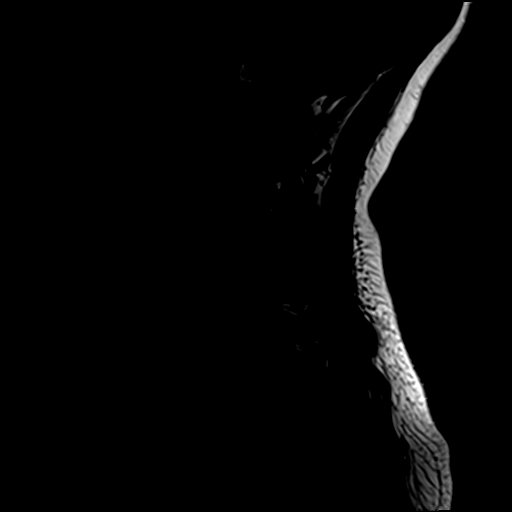

[Series 6: T2 · axial · 3.0mm · 0.27mm/px · z∈[-9,+75]mm · 8 of 25 slices shown (2 of 3)]
[im 1/25]
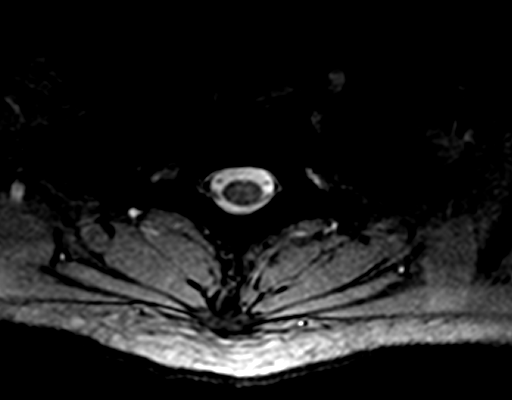
[im 4/25]
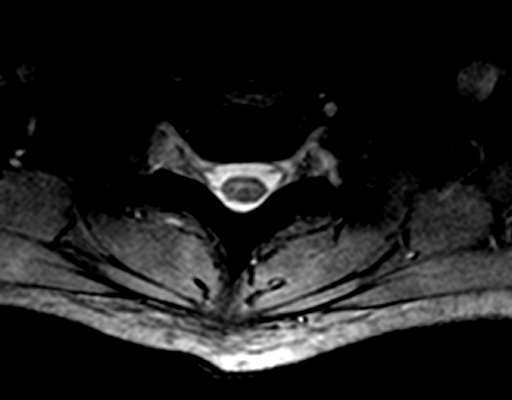
[im 8/25]
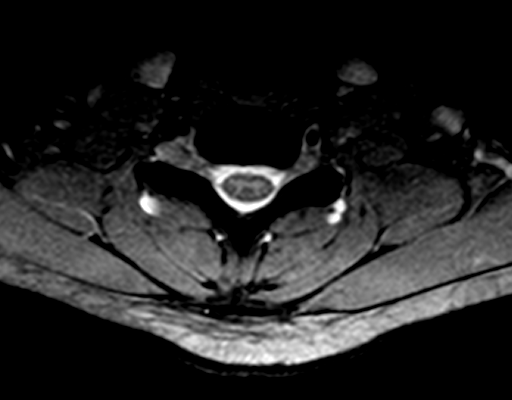
[im 12/25]
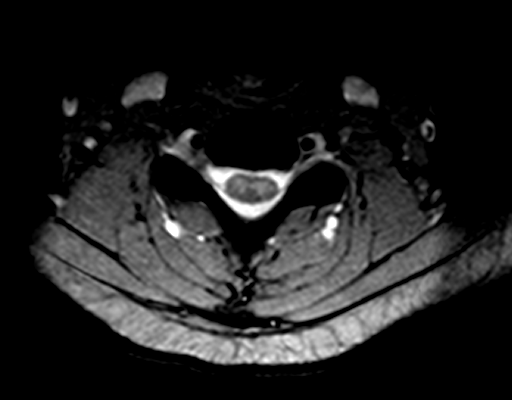
[im 13/25]
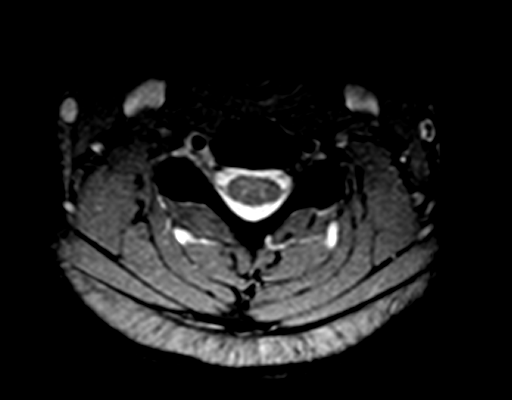
[im 17/25]
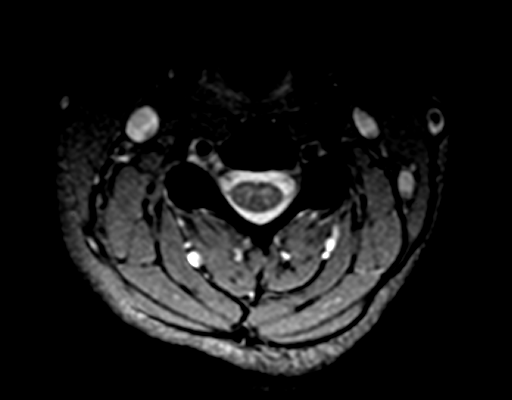
[im 21/25]
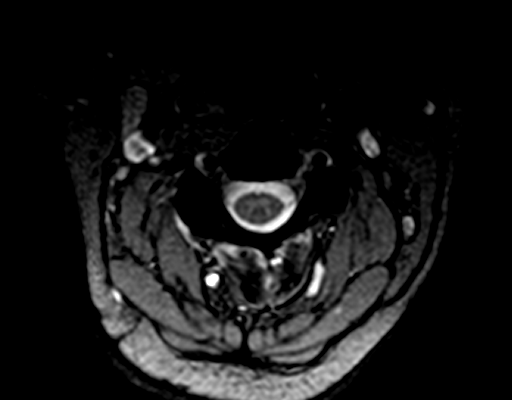
[im 25/25]
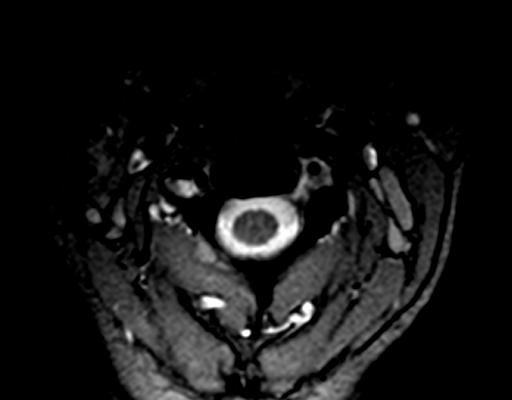

[Series 7: T2 · axial · 3.0mm · 0.27mm/px · z∈[-10,+61]mm · 5 of 25 slices shown (3 of 3)]
[im 1/25]
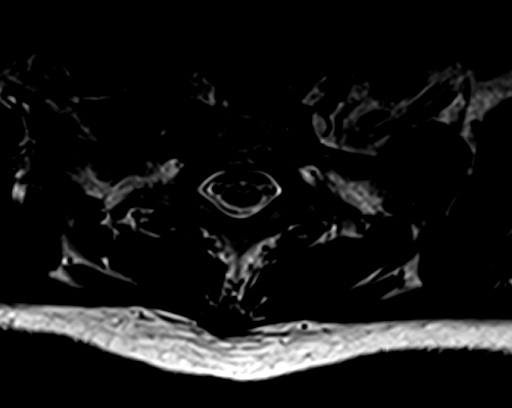
[im 4/25]
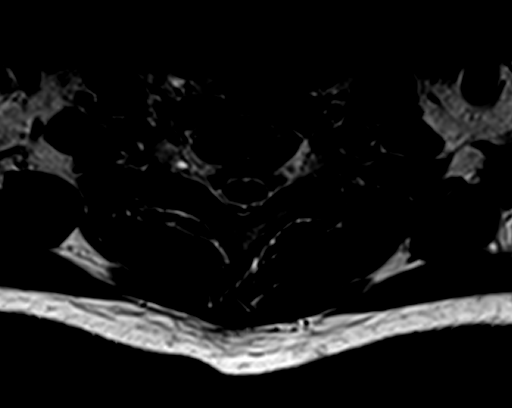
[im 8/25]
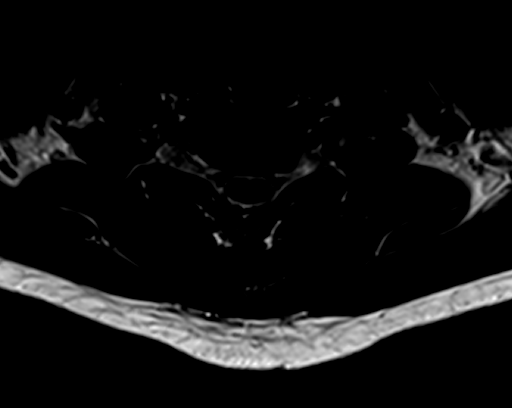
[im 13/25]
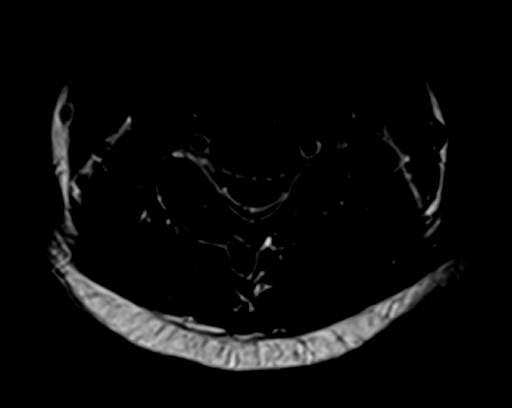
[im 21/25]
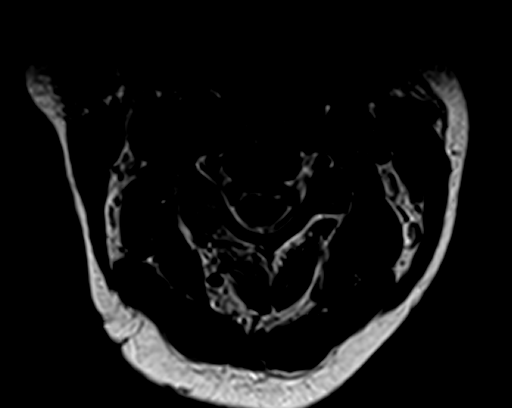

[22 of 48 positions shown; findings below may reference images not displayed]

FINDINGS: Straightening of cervical lordosis. No marrow edema or evidence of
acute osseous abnormality.

Cervicomedullary junction is within normal limits. Spinal cord
signal is within normal limits at all visualized levels.

Negative paraspinal soft tissues.

C2-C3:  Mild uncovertebral hypertrophy, otherwise negative.

C3-C4:  Mild right facet hypertrophy, otherwise negative.

C4-C5:  Mild facet hypertrophy, otherwise negative.

C5-C6: Small central disc protrusion best seen on series 3, image 7
and series 7, image 15. Narrowing of the ventral CSF space without
spinal or foraminal stenosis.

C6-C7: Small central disc protrusion best seen on series 3, image 6.
Mild narrowing of the ventral CSF space without spinal or foraminal
stenosis.

C7-T1:  Mild facet hypertrophy, otherwise negative.

No upper thoracic spinal stenosis.
IMPRESSION: Mild cervical spine degeneration. Small disc herniations at C5-C6
and C6-C7 but no associated spinal stenosis or convincing neural
impingement.

## 2016-05-27 ENCOUNTER — Ambulatory Visit (HOSPITAL_COMMUNITY): Payer: Self-pay | Admitting: Psychiatry

## 2016-06-09 ENCOUNTER — Ambulatory Visit (HOSPITAL_COMMUNITY): Payer: Self-pay | Admitting: Psychiatry

## 2016-06-18 ENCOUNTER — Other Ambulatory Visit (HOSPITAL_COMMUNITY): Payer: Self-pay | Admitting: Psychiatry

## 2016-06-18 DIAGNOSIS — F411 Generalized anxiety disorder: Secondary | ICD-10-CM

## 2016-06-20 ENCOUNTER — Other Ambulatory Visit (HOSPITAL_COMMUNITY): Payer: Self-pay

## 2016-06-20 DIAGNOSIS — F411 Generalized anxiety disorder: Secondary | ICD-10-CM

## 2016-06-20 MED ORDER — ALPRAZOLAM 0.5 MG PO TABS: ORAL_TABLET | ORAL | 0 refills | Status: DC

## 2016-07-14 ENCOUNTER — Other Ambulatory Visit (HOSPITAL_COMMUNITY): Payer: Self-pay | Admitting: Psychiatry

## 2016-07-14 DIAGNOSIS — F3131 Bipolar disorder, current episode depressed, mild: Secondary | ICD-10-CM

## 2016-07-14 MED ORDER — LAMOTRIGINE 200 MG PO TABS: 200.0000 mg | ORAL_TABLET | Freq: Every day | ORAL | 0 refills | Status: DC

## 2016-07-24 ENCOUNTER — Other Ambulatory Visit (HOSPITAL_COMMUNITY): Payer: Self-pay | Admitting: Psychiatry

## 2016-07-24 DIAGNOSIS — F411 Generalized anxiety disorder: Secondary | ICD-10-CM

## 2016-07-27 ENCOUNTER — Other Ambulatory Visit (HOSPITAL_COMMUNITY): Payer: Self-pay

## 2016-07-27 DIAGNOSIS — F411 Generalized anxiety disorder: Secondary | ICD-10-CM

## 2016-07-27 MED ORDER — ALPRAZOLAM 0.5 MG PO TABS: ORAL_TABLET | ORAL | 0 refills | Status: DC

## 2016-08-09 ENCOUNTER — Encounter (HOSPITAL_COMMUNITY): Payer: Self-pay | Admitting: Psychiatry

## 2016-08-09 ENCOUNTER — Ambulatory Visit (INDEPENDENT_AMBULATORY_CARE_PROVIDER_SITE_OTHER): Payer: 59 | Admitting: Psychiatry

## 2016-08-09 DIAGNOSIS — F3131 Bipolar disorder, current episode depressed, mild: Secondary | ICD-10-CM | POA: Diagnosis not present

## 2016-08-09 DIAGNOSIS — Z79899 Other long term (current) drug therapy: Secondary | ICD-10-CM

## 2016-08-09 DIAGNOSIS — F411 Generalized anxiety disorder: Secondary | ICD-10-CM

## 2016-08-09 MED ORDER — ALPRAZOLAM 0.5 MG PO TABS: ORAL_TABLET | ORAL | 1 refills | Status: DC

## 2016-08-09 MED ORDER — LAMOTRIGINE 150 MG PO TABS: 300.0000 mg | ORAL_TABLET | Freq: Every day | ORAL | 2 refills | Status: DC
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
## 2016-08-13 ENCOUNTER — Other Ambulatory Visit (HOSPITAL_COMMUNITY): Payer: Self-pay | Admitting: Psychiatry

## 2016-08-13 DIAGNOSIS — F3131 Bipolar disorder, current episode depressed, mild: Secondary | ICD-10-CM

## 2016-08-17 ENCOUNTER — Other Ambulatory Visit (HOSPITAL_COMMUNITY): Payer: Self-pay | Admitting: Psychiatry

## 2016-08-22 ENCOUNTER — Other Ambulatory Visit (HOSPITAL_COMMUNITY): Payer: Self-pay

## 2016-08-22 DIAGNOSIS — F3131 Bipolar disorder, current episode depressed, mild: Secondary | ICD-10-CM

## 2016-08-22 MED ORDER — LAMOTRIGINE 150 MG PO TABS: 300.0000 mg | ORAL_TABLET | Freq: Every day | ORAL | 2 refills | Status: DC

## 2016-08-24 ENCOUNTER — Telehealth (HOSPITAL_COMMUNITY): Payer: Self-pay

## 2016-08-25 NOTE — Telephone Encounter (Signed)
Opened in error

## 2016-08-28 ENCOUNTER — Other Ambulatory Visit (HOSPITAL_COMMUNITY): Payer: Self-pay | Admitting: Psychiatry

## 2016-08-28 DIAGNOSIS — F411 Generalized anxiety disorder: Secondary | ICD-10-CM

## 2016-11-05 ENCOUNTER — Other Ambulatory Visit (HOSPITAL_COMMUNITY): Payer: Self-pay | Admitting: Psychiatry

## 2016-11-05 DIAGNOSIS — F411 Generalized anxiety disorder: Secondary | ICD-10-CM

## 2016-11-10 ENCOUNTER — Ambulatory Visit (INDEPENDENT_AMBULATORY_CARE_PROVIDER_SITE_OTHER): Payer: BLUE CROSS/BLUE SHIELD | Admitting: Psychiatry

## 2016-11-10 ENCOUNTER — Encounter (HOSPITAL_COMMUNITY): Payer: Self-pay | Admitting: Psychiatry

## 2016-11-10 DIAGNOSIS — Z823 Family history of stroke: Secondary | ICD-10-CM | POA: Diagnosis not present

## 2016-11-10 DIAGNOSIS — F411 Generalized anxiety disorder: Secondary | ICD-10-CM | POA: Diagnosis not present

## 2016-11-10 DIAGNOSIS — Z888 Allergy status to other drugs, medicaments and biological substances status: Secondary | ICD-10-CM | POA: Diagnosis not present

## 2016-11-10 DIAGNOSIS — Z8261 Family history of arthritis: Secondary | ICD-10-CM

## 2016-11-10 DIAGNOSIS — Z79899 Other long term (current) drug therapy: Secondary | ICD-10-CM

## 2016-11-10 DIAGNOSIS — Z9889 Other specified postprocedural states: Secondary | ICD-10-CM

## 2016-11-10 DIAGNOSIS — F3131 Bipolar disorder, current episode depressed, mild: Secondary | ICD-10-CM

## 2016-11-10 DIAGNOSIS — Z803 Family history of malignant neoplasm of breast: Secondary | ICD-10-CM | POA: Diagnosis not present

## 2016-11-10 DIAGNOSIS — Z8249 Family history of ischemic heart disease and other diseases of the circulatory system: Secondary | ICD-10-CM

## 2016-11-10 DIAGNOSIS — Z87891 Personal history of nicotine dependence: Secondary | ICD-10-CM | POA: Diagnosis not present

## 2016-11-10 MED ORDER — ALPRAZOLAM 0.5 MG PO TABS: ORAL_TABLET | ORAL | 1 refills | Status: DC

## 2016-11-10 MED ORDER — LAMOTRIGINE 150 MG PO TABS: 300.0000 mg | ORAL_TABLET | Freq: Every day | ORAL | 2 refills | Status: DC
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
## 2016-11-17 ENCOUNTER — Other Ambulatory Visit (HOSPITAL_COMMUNITY): Payer: Self-pay | Admitting: Psychiatry

## 2016-11-17 DIAGNOSIS — F3131 Bipolar disorder, current episode depressed, mild: Secondary | ICD-10-CM

## 2016-11-21 ENCOUNTER — Other Ambulatory Visit (HOSPITAL_COMMUNITY): Payer: Self-pay

## 2016-11-21 DIAGNOSIS — F3131 Bipolar disorder, current episode depressed, mild: Secondary | ICD-10-CM

## 2016-11-21 MED ORDER — LAMOTRIGINE 150 MG PO TABS: 300.0000 mg | ORAL_TABLET | Freq: Every day | ORAL | 2 refills | Status: DC

## 2016-12-22 ENCOUNTER — Encounter: Payer: Self-pay | Admitting: Family Medicine

## 2017-01-05 ENCOUNTER — Other Ambulatory Visit (HOSPITAL_COMMUNITY): Payer: Self-pay | Admitting: Psychiatry

## 2017-01-05 DIAGNOSIS — F411 Generalized anxiety disorder: Secondary | ICD-10-CM

## 2017-01-10 ENCOUNTER — Other Ambulatory Visit (HOSPITAL_COMMUNITY): Payer: Self-pay

## 2017-01-10 DIAGNOSIS — F411 Generalized anxiety disorder: Secondary | ICD-10-CM

## 2017-01-10 MED ORDER — ALPRAZOLAM 0.5 MG PO TABS: ORAL_TABLET | ORAL | 0 refills | Status: DC

## 2017-01-11 ENCOUNTER — Telehealth: Payer: Self-pay | Admitting: Family Medicine

## 2017-01-11 NOTE — Telephone Encounter (Signed)
Pt called [facility] stating that she has had N&V&D with abd pain x 6 days and was wanting to know what else she could do for it?  CB# [phone]  Called pt and [facility] to schedule appt to see one of our providers to have the abd pain checked.

## 2017-01-19 ENCOUNTER — Encounter: Payer: Self-pay | Admitting: Family Medicine

## 2017-02-07 ENCOUNTER — Other Ambulatory Visit (HOSPITAL_COMMUNITY): Payer: Self-pay | Admitting: Psychiatry

## 2017-02-07 DIAGNOSIS — F411 Generalized anxiety disorder: Secondary | ICD-10-CM

## 2017-02-09 ENCOUNTER — Encounter (HOSPITAL_COMMUNITY): Payer: Self-pay | Admitting: Psychiatry

## 2017-02-09 ENCOUNTER — Ambulatory Visit (INDEPENDENT_AMBULATORY_CARE_PROVIDER_SITE_OTHER): Payer: BLUE CROSS/BLUE SHIELD | Admitting: Psychiatry

## 2017-02-09 DIAGNOSIS — Z87891 Personal history of nicotine dependence: Secondary | ICD-10-CM | POA: Diagnosis not present

## 2017-02-09 DIAGNOSIS — F411 Generalized anxiety disorder: Secondary | ICD-10-CM

## 2017-02-09 DIAGNOSIS — F3131 Bipolar disorder, current episode depressed, mild: Secondary | ICD-10-CM

## 2017-02-09 MED ORDER — LAMOTRIGINE 150 MG PO TABS: 300.0000 mg | ORAL_TABLET | Freq: Every day | ORAL | 2 refills | Status: DC

## 2017-02-09 MED ORDER — ALPRAZOLAM 0.5 MG PO TABS: ORAL_TABLET | ORAL | 1 refills | Status: DC
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
## 2017-02-19 ENCOUNTER — Other Ambulatory Visit (HOSPITAL_COMMUNITY): Payer: Self-pay | Admitting: Psychiatry

## 2017-02-19 DIAGNOSIS — F3131 Bipolar disorder, current episode depressed, mild: Secondary | ICD-10-CM

## 2017-02-22 ENCOUNTER — Telehealth (HOSPITAL_COMMUNITY): Payer: Self-pay

## 2017-02-22 DIAGNOSIS — F3131 Bipolar disorder, current episode depressed, mild: Secondary | ICD-10-CM

## 2017-02-22 MED ORDER — LAMOTRIGINE 150 MG PO TABS: 300.0000 mg | ORAL_TABLET | Freq: Every day | ORAL | 2 refills | Status: DC

## 2017-02-22 NOTE — Telephone Encounter (Signed)
Medication management - Called patient after a message was received from a pharmacist at [place] requesting Lamictal refills. Pt. reported she does not get medication from [facility] and needs [facility] orders local [facility].   Met with Dr. Adele Schilder to inform patient reported previous medication orders for Lamictal were sent to the wrong place as she does not use [facility] and needs Lamictal orders sent to [facility] on [address] as soon as possible as will be out on 02/23/17.  Dr. Adele Schilder gave verbal order to reorder Lamictal 150 mg, 2 tablets by mouth daily, #60 with 2 refills.   New orders e-scribed as approved by Dr. Adele Schilder and called [facility] with Ronalee Belts, pharmacist to verify orders from 02/09/17 were never sent and canceled order per Dr. Marguerite Olea request. Called patient to inform new orders had been approved and sent to her requested [facility].

## 2017-02-23 ENCOUNTER — Other Ambulatory Visit (HOSPITAL_COMMUNITY): Payer: Self-pay | Admitting: Psychiatry

## 2017-03-30 ENCOUNTER — Telehealth (HOSPITAL_COMMUNITY): Payer: Self-pay

## 2017-03-30 ENCOUNTER — Other Ambulatory Visit (HOSPITAL_COMMUNITY): Payer: Self-pay | Admitting: Psychiatry

## 2017-03-30 DIAGNOSIS — F3131 Bipolar disorder, current episode depressed, mild: Secondary | ICD-10-CM

## 2017-03-30 MED ORDER — LAMOTRIGINE 150 MG PO TABS: 300.0000 mg | ORAL_TABLET | Freq: Every day | ORAL | 0 refills | Status: DC

## 2017-03-30 NOTE — Telephone Encounter (Signed)
Medication refill request - Fax received from [facility]'s [facility] [address] for a 90 day order of her Lamictal, last ordered for #30 + 2 refills 03/10/17. Pt. returns 05/11/17.

## 2017-04-03 ENCOUNTER — Other Ambulatory Visit: Payer: Self-pay | Admitting: Family Medicine

## 2017-04-03 DIAGNOSIS — K219 Gastro-esophageal reflux disease without esophagitis: Secondary | ICD-10-CM

## 2017-04-04 ENCOUNTER — Encounter: Payer: Self-pay | Admitting: Family Medicine

## 2017-04-11 ENCOUNTER — Other Ambulatory Visit (HOSPITAL_COMMUNITY): Payer: Self-pay | Admitting: Psychiatry

## 2017-04-11 DIAGNOSIS — F411 Generalized anxiety disorder: Secondary | ICD-10-CM

## 2017-04-13 ENCOUNTER — Other Ambulatory Visit (HOSPITAL_COMMUNITY): Payer: Self-pay

## 2017-04-13 DIAGNOSIS — F411 Generalized anxiety disorder: Secondary | ICD-10-CM

## 2017-04-13 MED ORDER — ALPRAZOLAM 0.5 MG PO TABS: ORAL_TABLET | ORAL | 0 refills | Status: DC

## 2017-05-02 ENCOUNTER — Encounter: Payer: 59 | Admitting: Family Medicine

## 2017-05-06 ENCOUNTER — Other Ambulatory Visit: Payer: Self-pay | Admitting: Family Medicine

## 2017-05-10 ENCOUNTER — Other Ambulatory Visit: Payer: Self-pay | Admitting: Obstetrics and Gynecology

## 2017-05-10 ENCOUNTER — Other Ambulatory Visit (HOSPITAL_COMMUNITY): Payer: Self-pay | Admitting: Psychiatry

## 2017-05-10 DIAGNOSIS — F411 Generalized anxiety disorder: Secondary | ICD-10-CM

## 2017-05-10 DIAGNOSIS — R5381 Other malaise: Secondary | ICD-10-CM

## 2017-05-11 ENCOUNTER — Ambulatory Visit (INDEPENDENT_AMBULATORY_CARE_PROVIDER_SITE_OTHER): Payer: BLUE CROSS/BLUE SHIELD | Admitting: Psychiatry

## 2017-05-11 ENCOUNTER — Encounter (HOSPITAL_COMMUNITY): Payer: Self-pay | Admitting: Psychiatry

## 2017-05-11 DIAGNOSIS — Z811 Family history of alcohol abuse and dependence: Secondary | ICD-10-CM | POA: Diagnosis not present

## 2017-05-11 DIAGNOSIS — F3131 Bipolar disorder, current episode depressed, mild: Secondary | ICD-10-CM

## 2017-05-11 DIAGNOSIS — F411 Generalized anxiety disorder: Secondary | ICD-10-CM | POA: Diagnosis not present

## 2017-05-11 DIAGNOSIS — Z62820 Parent-biological child conflict: Secondary | ICD-10-CM

## 2017-05-11 DIAGNOSIS — Z87891 Personal history of nicotine dependence: Secondary | ICD-10-CM | POA: Diagnosis not present

## 2017-05-11 MED ORDER — LAMOTRIGINE 150 MG PO TABS: 300.0000 mg | ORAL_TABLET | Freq: Every day | ORAL | 0 refills | Status: DC

## 2017-05-11 MED ORDER — ALPRAZOLAM 0.25 MG PO TABS: 0.2500 mg | ORAL_TABLET | Freq: Two times a day (BID) | ORAL | 2 refills | Status: DC | PRN
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
## 2017-05-18 ENCOUNTER — Other Ambulatory Visit: Payer: Self-pay | Admitting: Family Medicine

## 2017-05-18 DIAGNOSIS — K219 Gastro-esophageal reflux disease without esophagitis: Secondary | ICD-10-CM

## 2017-05-25 ENCOUNTER — Ambulatory Visit
Admission: RE | Admit: 2017-05-25 | Discharge: 2017-05-25 | Disposition: A | Discharge status: Home / Self Care | Payer: BLUE CROSS/BLUE SHIELD | Source: Ambulatory Visit | Attending: Obstetrics and Gynecology | Admitting: Obstetrics and Gynecology

## 2017-05-25 DIAGNOSIS — R5381 Other malaise: Secondary | ICD-10-CM

## 2017-06-02 ENCOUNTER — Encounter: Payer: Self-pay | Admitting: Family Medicine

## 2017-06-02 ENCOUNTER — Ambulatory Visit (INDEPENDENT_AMBULATORY_CARE_PROVIDER_SITE_OTHER): Payer: BLUE CROSS/BLUE SHIELD | Admitting: Family Medicine

## 2017-06-02 VITALS — BP 128/62 | HR 82 | Temp 98.4°F | Resp 14 | Ht 67.0 in | Wt 154.0 lb

## 2017-06-02 DIAGNOSIS — M542 Cervicalgia: Secondary | ICD-10-CM | POA: Diagnosis not present

## 2017-06-02 DIAGNOSIS — G8929 Other chronic pain: Secondary | ICD-10-CM | POA: Diagnosis not present

## 2017-06-02 DIAGNOSIS — R635 Abnormal weight gain: Secondary | ICD-10-CM | POA: Diagnosis not present

## 2017-06-02 DIAGNOSIS — Z0000 Encounter for general adult medical examination without abnormal findings: Secondary | ICD-10-CM | POA: Diagnosis not present

## 2017-06-02 DIAGNOSIS — M25511 Pain in right shoulder: Secondary | ICD-10-CM | POA: Diagnosis not present

## 2017-06-02 LAB — COMPLETE METABOLIC PANEL WITH GFR
AG RATIO: 1.7 (calc) (ref 1.0–2.5)
ALKALINE PHOSPHATASE (APISO): 43 U/L (ref 33–115)
ALT: 12 U/L (ref 6–29)
AST: 14 U/L (ref 10–30)
Albumin: 4.1 g/dL (ref 3.6–5.1)
BUN: 17 mg/dL (ref 7–25)
CO2: 28 mmol/L (ref 20–32)
Calcium: 9.3 mg/dL (ref 8.6–10.2)
Chloride: 105 mmol/L (ref 98–110)
Creat: 1.01 mg/dL (ref 0.50–1.10)
GFR, EST AFRICAN AMERICAN: 84 mL/min/{1.73_m2} (ref >=60)
GFR, Est Non African American: 72 mL/min/{1.73_m2} (ref >=60)
Globulin: 2.4 g/dL (calc) (ref 1.9–3.7)
Glucose, Bld: 96 mg/dL (ref 65–99)
POTASSIUM: 4 mmol/L (ref 3.5–5.3)
Sodium: 138 mmol/L (ref 135–146)
Total Bilirubin: 0.4 mg/dL (ref 0.2–1.2)
Total Protein: 6.5 g/dL (ref 6.1–8.1)

## 2017-06-02 LAB — CBC WITH DIFFERENTIAL/PLATELET
BASOS PCT: 0.4 %
Basophils Absolute: 32 cells/uL (ref 0–200)
EOS PCT: 1.4 %
Eosinophils Absolute: 113 cells/uL (ref 15–500)
HCT: 37.2 % (ref 35.0–45.0)
HEMOGLOBIN: 12.9 g/dL (ref 11.7–15.5)
LYMPHS ABS: 1539 {cells}/uL (ref 850–3900)
MCH: 30.9 pg (ref 27.0–33.0)
MCHC: 34.7 g/dL (ref 32.0–36.0)
MCV: 89 fL (ref 80.0–100.0)
MPV: 11.4 fL (ref 7.5–12.5)
Monocytes Relative: 8.6 %
NEUTROS ABS: 5719 {cells}/uL (ref 1500–7800)
Neutrophils Relative %: 70.6 %
PLATELETS: 224 10*3/uL (ref 140–400)
RBC: 4.18 10*6/uL (ref 3.80–5.10)
RDW: 11.8 % (ref 11.0–15.0)
Total Lymphocyte: 19 %
WBC mixed population: 697 cells/uL (ref 200–950)
WBC: 8.1 10*3/uL (ref 3.8–10.8)

## 2017-06-02 LAB — LIPID PANEL
CHOLESTEROL: 137 mg/dL (ref <200)
HDL: 48 mg/dL — ABNORMAL LOW (ref >50)
LDL CHOLESTEROL (CALC): 76 mg/dL
Non-HDL Cholesterol (Calc): 89 mg/dL (calc) (ref <130)
TRIGLYCERIDES: 51 mg/dL (ref <150)
Total CHOL/HDL Ratio: 2.9 (calc) (ref <5.0)

## 2017-06-02 LAB — TSH: TSH: 0.43 m[IU]/L

## 2017-06-02 MED ORDER — PHENTERMINE HCL 37.5 MG PO TABS: 37.5000 mg | ORAL_TABLET | Freq: Every day | ORAL | 2 refills | Status: DC

## 2017-06-02 NOTE — Progress Notes (Signed)
Subjective:    Patient ID: Amanda Strong, female    DOB: [birth year], 36 y.o.   MRN: [id number]  HPI Patient is here today for complete physical exam. [name] also complaining of pain underneath her right shoulder blade radiating there from her neck as well as into her right shoulder. She reportedly had an MRI done recently of her shoulder that revealed no rotator cuff tear or shoulder pathology. She denies any weakness in her right arm however she does occasionally have numbness and tingling radiating into her right arm at night. Differential diagnosis includes a muscle strain near her shoulder blade versus cervical radiculopathy. [name] also concerned because she has gained more than 10 pounds since her physical last year even though her BMI is still healthy. She would like to try phentermine to help jump start her weight loss in addition to lifestyle changes to get back to her baseline weight which is near 145 pounds. [name] exercising and watching her diet that she is having limited success losing weight.  Reently had pap and mammogram at her GYN. Past Medical History:  Diagnosis Date  . Abnormal Pap smear   . Acne   . Allergy   . Anxiety   . Depression   . Group B streptococcal infection in pregnancy 10/28/2012  . History of chicken pox   . Hypertension   . Hyperthyroidism   . LGSIL (low grade squamous intraepithelial dysplasia)    Dr. Senaida Ores  . Normal pregnancy, repeat 10/28/2012   Past Surgical History:  Procedure Laterality Date  . BUNIONECTOMY    . FOOT SURGERY    . WISDOM TOOTH EXTRACTION     Current Outpatient Prescriptions on File Prior to Visit  Medication Sig Dispense Refill  . ALPRAZolam (XANAX) 0.25 MG tablet Take 1 tablet (0.25 mg total) by mouth 2 (two) times daily as needed for anxiety. 60 tablet 2  . lamoTRIgine (LAMICTAL) 150 MG tablet Take 2 tablets (300 mg total) by mouth daily. 180 tablet 0  . loratadine (CLARITIN) 10 MG tablet Take 10 mg by mouth daily.      Marland Kitchen losartan-hydrochlorothiazide (HYZAAR) 50-12.5 MG tablet TAKE 1 TABLET BY MOUTH EVERY DAY 30 tablet 0  . montelukast (SINGULAIR) 10 MG tablet Take 10 mg by mouth at bedtime.    Marland Kitchen omeprazole (PRILOSEC) 40 MG capsule TAKE ONE CAPSULE BY MOUTH EVERY DAY 30 capsule 0  . [DISCONTINUED] desvenlafaxine (PRISTIQ) 50 MG 24 hr tablet Take 1 tablet (50 mg total) by mouth daily. 21 tablet 0  . [DISCONTINUED] olmesartan-hydrochlorothiazide (BENICAR HCT) 20-12.5 MG per tablet Take 1 tablet by mouth daily.       No current facility-administered medications on file prior to visit.    Allergies  Allergen Reactions  . Cephalexin     REACTION: intense itching all over  . Percocet [Oxycodone-Acetaminophen] Itching   Social History   Social History  . Marital status: Single    Spouse name: N/A  . Number of children: 1  . Years of education: 17   Occupational History  . 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 Smoking status: Former Smoker    Quit date: 10/22/2008  . Smokeless tobacco: Never Used  . Alcohol use No     Comment: occasionally  . Drug use: No  . Sexual activity: Yes    Partners: Male    Birth control/ protection: Condom, IUD   Other Topics Concern  . Not  on file   Social History Narrative   Caffeine Use-yes   Regular exercise-no      Entered 03/2014:   Single Mom of 2 children   Children are:      Girl--18 months old      Boy--36 years old   She works at [employer].    Currently works with wound care   Family History  Problem Relation Age of Onset  . Hypertension Mother   . Cancer Mother        breast  . Hypertension Father   . Arthritis Father   . Hypertension Maternal Grandmother   . Arthritis Maternal Grandmother   . Hyperlipidemia Maternal Grandmother   . Stroke Maternal Grandfather   . Alcohol abuse Maternal Grandfather   . Arthritis Maternal Grandfather   . Hypertension Paternal Grandmother   . Heart disease  Paternal Grandmother   . Hypertension Paternal Grandfather   . Hypertension Maternal Aunt   . Hypertension Maternal Uncle   . Hypertension Paternal Aunt   . Hypertension Paternal Uncle       Review of Systems  All other systems reviewed and are negative.      Objective:   Physical Exam  Constitutional: [name] oriented to person, place, and time. She appears well-developed and well-nourished. No distress.  HENT:  Head: Normocephalic and atraumatic.  Right Ear: External ear normal.  Left Ear: External ear normal.  Nose: Nose normal.  Mouth/Throat: Oropharynx is clear and moist. No oropharyngeal exudate.  Eyes: Pupils are equal, round, and reactive to light. Conjunctivae and EOM are normal. Right eye exhibits no discharge. Left eye exhibits no discharge. No scleral icterus.  Neck: Normal range of motion. Neck supple. No JVD present. No tracheal deviation present. No thyromegaly present.  Cardiovascular: Normal rate, regular rhythm, normal heart sounds and intact distal pulses.  Exam reveals no gallop and no friction rub.   No murmur heard. Pulmonary/Chest: Effort normal and breath sounds normal. No stridor. No respiratory distress. She has no wheezes. She has no rales. She exhibits no tenderness.  Abdominal: Soft. Bowel sounds are normal. She exhibits no distension and no mass. There is no tenderness. There is no rebound and no guarding.  Musculoskeletal: Normal range of motion. She exhibits no edema, tenderness or deformity.  Lymphadenopathy:    She has no cervical adenopathy.  Neurological: [name] alert and oriented to person, place, and time. She has normal reflexes. She displays normal reflexes. No cranial nerve deficit. She exhibits normal muscle tone. Coordination normal.  Skin: Skin is warm. No rash noted. [name] not diaphoretic. No erythema. No pallor.  Psychiatric: She has a normal mood and affect. Her behavior is normal. Judgment and thought content normal.  Vitals  reviewed.         Assessment & Plan:  General medical exam - Plan: CBC with Differential/Platelet, COMPLETE METABOLIC PANEL WITH GFR, Lipid panel  Weight gain - Plan: TSH  Neck pain - Plan: Ambulatory referral to Physical Therapy  Chronic right shoulder pain - Plan: Ambulatory referral to Physical Therapy  Her physical exam is unremarkable. Blood pressure is excellent. She declines a flu shot today. She plans to get one at her business. Cancer screening is up-to-date. I will check a CBC, CMP, fasting lipid panel given her history of hypertension. Her recent weight gain I will check a TSH. Her depression with mood swings is managed by her psychiatrist. I recommended physical therapy for her neck and shoulder pain. If pain  worsens, I will proceed with an MRI of the cervical spine to evaluate for cervical radiculopathy. I did give the patient a one-time prescription for phentermine 37.5 mg by mouth every morning to help facilitate weight loss. She can take this for up to 90 days. I will not refill it after that. I only want her to try to lose about 10 pounds.

## 2017-06-05 ENCOUNTER — Encounter: Payer: Self-pay | Admitting: *Deleted

## 2017-06-07 ENCOUNTER — Other Ambulatory Visit: Payer: Self-pay | Admitting: Family Medicine

## 2017-06-12 ENCOUNTER — Ambulatory Visit: Payer: BLUE CROSS/BLUE SHIELD | Attending: Family Medicine | Admitting: Rehabilitation

## 2017-06-12 DIAGNOSIS — M25511 Pain in right shoulder: Secondary | ICD-10-CM | POA: Diagnosis present

## 2017-06-12 DIAGNOSIS — G8929 Other chronic pain: Secondary | ICD-10-CM | POA: Diagnosis present

## 2017-06-12 DIAGNOSIS — M542 Cervicalgia: Secondary | ICD-10-CM | POA: Diagnosis present

## 2017-06-12 NOTE — Therapy (Signed)
[facility] [address] [place], [place], [address] Phone: [phone]   Fax:  [phone]  Physical Therapy Evaluation  Patient Details  Name: Amanda Strong MRN: [id number] Date of Birth: 1981-07-16 Referring Provider: Lynnea Ferrier, MD  Encounter Date: 06/12/2017      PT End of Session - 06/12/17 2019    Visit Number 1   Number of Visits 12   Date for PT Re-Evaluation 07/24/17   Authorization Type [employer]   PT Start Time 1500   PT Stop Time 1545   PT Time Calculation (min) 45 min   Activity Tolerance Patient tolerated treatment well      Past Medical History:  Diagnosis Date  . Abnormal Pap smear   . Acne   . Allergy   . Anxiety   . Depression   . Group B streptococcal infection in pregnancy 10/28/2012  . History of chicken pox   . Hypertension   . Hyperthyroidism   . LGSIL (low grade squamous intraepithelial dysplasia)    Dr. Senaida Ores  . Normal pregnancy, repeat 10/28/2012    Past Surgical History:  Procedure Laterality Date  . BUNIONECTOMY    . FOOT SURGERY    . WISDOM TOOTH EXTRACTION      There were no vitals filed for this visit.       Subjective Assessment - 06/12/17 1507    Subjective Pt presents with complaints of R sided neck and shoulder pain x 1 year.  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  Now in admissions instead of patient care due to pain.  The R neck and shoulder are now always achy and sore with some numbness and tingling at night.  Starts to get weak and give out during repeated activity.     Pertinent History none   Limitations Lifting   Diagnostic tests cervical MRI showing mild disc protrusions C5-6, C6-7,     shoulder MRI showing supra- and infra- tendonitis and posterior labral tear   Patient Stated Goals decrease the arm pain, be able to lift child better (36 years old)   Currently in  Pain? Yes   Pain Score 1    Pain Location Scapula   Pain Orientation Right   Pain Descriptors / Indicators Aching   Pain Type Chronic pain   Pain Radiating Towards the neck   Pain Onset More than a month ago   Pain Frequency Constant   Aggravating Factors  sleeping on it, lifting, repetitive movements with the arm   Pain Relieving Factors ibuprofen   Multiple Pain Sites No            [facility] PT Assessment - 06/12/17 0001      Assessment   Medical Diagnosis R shoulder pain, neck pain   Referring Provider Lynnea Ferrier, MD   Onset Date/Surgical Date 09/20/15   Hand Dominance Right   Next MD Visit unknown   Prior Therapy yes only 3 visits it was too far away     Precautions   Precautions None     Restrictions   Weight Bearing Restrictions No     Balance Screen   Has the patient fallen in the past 6 months No     Prior Function   Level of Independence Independent     Observation/Other Assessments   Focus on Therapeutic Outcomes (FOTO)  33% limited     Sensation  Light Touch Appears Intact     Posture/Postural Control   Posture/Postural Control Postural limitations   Postural Limitations Rounded Shoulders;Forward head   Posture Comments no change in pain with postural changes     ROM / Strength   AROM / PROM / Strength AROM;PROM;Strength     AROM   Overall AROM Comments shoulder AROM full with slight discomfort all end range; no scapular dyskinesia apparent in standing with flexion or extension   AROM Assessment Site Cervical;Shoulder   Right/Left Shoulder Right   Cervical Flexion 40   Cervical Extension 40  apprehensive   Cervical - Right Side Bend 30  pain   Cervical - Left Side Bend 25  pain   Cervical - Right Rotation 50   Cervical - Left Rotation 55     PROM   Overall PROM Comments cervcal PROM full and painfree; shoulder PROM full with pain end range all motions     Strength   Overall Strength Comments MT/UT: 4/5, LT: 4-/5 prone   Strength  Assessment Site Shoulder   Right/Left Shoulder Right   Right Shoulder Flexion 4/5   Right Shoulder ABduction 4-/5   Right Shoulder Internal Rotation 4-/5   Right Shoulder External Rotation 4-/5     Palpation   Palpation comment 2 ttp R UT, suboccipitals, cervical paraspinals, LS, rhomboids     Special Tests    Special Tests Cervical   Cervical Tests Spurling's;Dictraction     Spurling's   Findings Negative   Side Right     Distraction Test   Findngs Negative   side Right            Objective measurements completed on examination: See above findings.                  PT Education - 06/12/17 2019    Education provided Yes   Education Details HEP, diagnosis, POC   Person(s) Educated Patient   Methods Explanation;Demonstration;Handout   Comprehension Verbalized understanding;Need further instruction          PT Short Term Goals - 06/12/17 2029      PT SHORT TERM GOAL #1   Title Pt will be ind with initial HEP   Time 6   Period Weeks   Status New   Target Date 07/24/17           PT Long Term Goals - 06/12/17 2030      PT LONG TERM GOAL #1   Title Pt will decrease R neck and shoulder pain to intermittent only   Time 6   Period Weeks   Status New   Target Date 07/24/17     PT LONG TERM GOAL #2   Title Pt will improve R cervical and shoulder AROM to WNL without increased pain   Time 6   Period Weeks   Status New   Target Date 07/24/17     PT LONG TERM GOAL #3   Title Pt will improve R shoulder and scapular strength to 4/5 or greater without increased pain   Time 6   Period Weeks   Status New   Target Date 07/24/17     PT LONG TERM GOAL #4   Title Pt will report decreased feelings of R UE "giving out" to 0-1x per week    Time 6   Period Weeks   Status New   Target Date 07/24/17  Plan - 06/12/17 2020    Clinical Impression Statement Pt presents to PT with a 1 year history of R shoulder and neck pain  stemming originally from 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  Imaging last year showing infraspinatus and supraspinatus tendonitis/tenosynovitis and posterior labral tear of the R shoulder and mild disc buldging C5-6 and C6-7 without nerve root compression.  Deficits upon examination include poor posture, weakness of the R shoulder, scapular, and cervical muscles and tenderness of the R sided cervical and scapular muscles.  Therapy will be beneficial to decrease R shoulder and neck pain and to improve functional use of the R UE.     Clinical Presentation Stable   Clinical Decision Making Low   Rehab Potential Good   PT Frequency 2x / week   PT Duration 6 weeks   PT Treatment/Interventions Electrical Stimulation;Manual techniques;Dry needling;Therapeutic exercise;Taping;Neuromuscular re-education;Patient/family education;Moist Heat   PT Next Visit Plan review HEP, begin R cervical/periscapular STM and modalities as needed, cervical/shoulder/postural strengthening R   PT Home Exercise Plan Given initial HEP 9/24 per handout   Consulted and Agree with Plan of Care Patient      Patient will benefit from skilled therapeutic intervention in order to improve the following deficits and impairments:  Postural dysfunction, Impaired UE functional use, Pain, Decreased strength  Visit Diagnosis: Cervicalgia - Plan: PT plan of care cert/re-cert  Chronic right shoulder pain - Plan: PT plan of care cert/re-cert     Problem List Patient Active Problem List   Diagnosis Date Noted  . LGSIL (low grade squamous intraepithelial dysplasia)   . SVD (spontaneous vaginal delivery) 10/29/2012  . Normal pregnancy, repeat 10/28/2012  . Group B streptococcal infection in pregnancy 10/28/2012  . Abnormal TSH 01/23/2012  . FLATULENCE ERUCTATION AND GAS PAIN 06/30/2010  . COSTOCHONDRITIS 10/21/2009  . WEIGHT GAIN 10/14/2009  . Major depression, chronic 05/08/2009   . CONSTIPATION, MILD 06/27/2008  . VAGINITIS, BACTERIAL 06/27/2008  . GAD (generalized anxiety disorder) 04/04/2008  . ALLERGIC RHINITIS 08/31/2007  . HYPERTENSION 04/27/2007  . ACNE NEC 04/27/2007    Idamae Lusher, DPT, CMP 06/12/2017, 8:38 PM  [facility] [address] [place], [place], [address] Phone: [phone]   Fax:  [phone]  Name: LICET DUNPHY MRN: [id number] Date of Birth: 05/10/1981

## 2017-06-14 ENCOUNTER — Encounter: Payer: Self-pay | Admitting: Physical Therapy

## 2017-06-19 ENCOUNTER — Encounter: Payer: Self-pay | Admitting: Physical Therapy

## 2017-06-19 ENCOUNTER — Ambulatory Visit: Payer: BLUE CROSS/BLUE SHIELD | Attending: Family Medicine | Admitting: Physical Therapy

## 2017-06-19 DIAGNOSIS — M542 Cervicalgia: Secondary | ICD-10-CM

## 2017-06-19 DIAGNOSIS — G8929 Other chronic pain: Secondary | ICD-10-CM | POA: Insufficient documentation

## 2017-06-19 DIAGNOSIS — M25511 Pain in right shoulder: Secondary | ICD-10-CM | POA: Insufficient documentation

## 2017-06-19 NOTE — Therapy (Signed)
[facility] [address] [place], [place], [address] Phone: [phone]   Fax:  [phone]  Physical Therapy Treatment  Patient Details  Name: Amanda Strong MRN: [id number] Date of Birth: [birth year] Referring Provider: Lynnea Ferrier, MD  Encounter Date: 06/19/2017      PT End of Session - 06/19/17 1541    Visit Number 2   Number of Visits 12   Date for PT Re-Evaluation 07/24/17   PT Start Time 1502   PT Stop Time 1552   PT Time Calculation (min) 50 min   Activity Tolerance Patient tolerated treatment well   Behavior During Therapy [facility] for tasks assessed/performed      Past Medical History:  Diagnosis Date  . Abnormal Pap smear   . Acne   . Allergy   . Anxiety   . Depression   . Group B streptococcal infection in pregnancy 10/28/2012  . History of chicken pox   . Hypertension   . Hyperthyroidism   . LGSIL (low grade squamous intraepithelial dysplasia)    Dr. Senaida Ores  . Normal pregnancy, repeat 10/28/2012    Past Surgical History:  Procedure Laterality Date  . BUNIONECTOMY    . FOOT SURGERY    . WISDOM TOOTH EXTRACTION      There were no vitals filed for this visit.      Subjective Assessment - 06/19/17 1507    Subjective "I've been doing the exercise said it seems to be the same, reports intermittent numbness and the R hand.    Currently in Pain? Yes   Pain Score 2    Pain Orientation Right   Pain Descriptors / Indicators Aching   Pain Type Chronic pain   Pain Onset More than a month ago   Pain Frequency Constant   Aggravating Factors  sleeping on it, lifting and repetitive movements   Pain Relieving Factors ibuprofen                         [facility] Adult PT Treatment/Exercise - 06/19/17 1556      Manual Therapy   Manual Therapy Joint mobilization;Soft tissue mobilization;Taping   Manual therapy comments sub-occipital release x 5 min   Joint Mobilization grade 3 1st rib mobs  on the R    Soft tissue mobilization IASTM over the R scalene and upper trap   McConnell inhibition taping for R upper trap     Neck Exercises: Stretches   Upper Trapezius Stretch 2 reps;30 seconds          Trigger Point Dry Needling - 06/19/17 1539    Consent Given? Yes   Education Handout Provided Yes   Muscles Treated Upper Body Upper trapezius  scalenes   Upper Trapezius Response Twitch reponse elicited;Palpable increased muscle length  R side only              PT Education - 06/19/17 1540    Education provided Yes   Education Details muscle anatomy and referral. what TPDN is, benefits/ what to expect and after care.    Person(s) Educated Patient   Methods Explanation;Verbal cues   Comprehension Verbalized understanding;Verbal cues required          PT Short Term Goals - 06/12/17 2029      PT SHORT TERM GOAL #1   Title Pt will be ind with initial HEP   Time 6   Period Weeks   Status New   Target Date 07/24/17  PT Long Term Goals - 06/12/17 2030      PT LONG TERM GOAL #1   Title Pt will decrease R neck and shoulder pain to intermittent only   Time 6   Period Weeks   Status New   Target Date 07/24/17     PT LONG TERM GOAL #2   Title Pt will improve R cervical and shoulder AROM to WNL without increased pain   Time 6   Period Weeks   Status New   Target Date 07/24/17     PT LONG TERM GOAL #3   Title Pt will improve R shoulder and scapular strength to 4/5 or greater without increased pain   Time 6   Period Weeks   Status New   Target Date 07/24/17     PT LONG TERM GOAL #4   Title Pt will report decreased feelings of R UE "giving out" to 0-1x per week    Time 6   Period Weeks   Status New   Target Date 07/24/17               Plan - 06/19/17 1736    Clinical Impression Statement pt reports limited improvement since evaluation, and has been compliant with exercises. Further testing revealed positive adsons and edens  testing indicating high probability of possible thoracic outlet syndrome. Edcuated and performd DN on R scalene and R upper trap. perofrmed mobs and IASTM techniques which pt reported improvement of pain and symptoms. trialed inhibition taping for R upper trap and utilized MHP post session to calm down muscle soreness.    PT Next Visit Plan review HEP, How as DN begin R cervical/periscapular STM and modalities as needed, cervical/shoulder/postural strengthening R   PT Home Exercise Plan Given initial HEP 9/24 per handout   Consulted and Agree with Plan of Care Patient      Patient will benefit from skilled therapeutic intervention in order to improve the following deficits and impairments:  Postural dysfunction, Impaired UE functional use, Pain, Decreased strength  Visit Diagnosis: Cervicalgia  Chronic right shoulder pain     Problem List Patient Active Problem List   Diagnosis Date Noted  . LGSIL (low grade squamous intraepithelial dysplasia)   . SVD (spontaneous vaginal delivery) 10/29/2012  . Normal pregnancy, repeat 10/28/2012  . Group B streptococcal infection in pregnancy 10/28/2012  . Abnormal TSH 01/23/2012  . FLATULENCE ERUCTATION AND GAS PAIN 06/30/2010  . COSTOCHONDRITIS 10/21/2009  . WEIGHT GAIN 10/14/2009  . Major depression, chronic 05/08/2009  . CONSTIPATION, MILD 06/27/2008  . VAGINITIS, BACTERIAL 06/27/2008  . GAD (generalized anxiety disorder) 04/04/2008  . ALLERGIC RHINITIS 08/31/2007  . HYPERTENSION 04/27/2007  . ACNE NEC 04/27/2007   Lulu Riding PT, DPT, LAT, ATC  06/19/17  5:41 PM      [facility] [address] [place], [place], [address] Phone: [phone]   Fax:  [phone]  Name: Amanda Strong MRN: [id number] Date of Birth: September 10, 1981

## 2017-06-26 ENCOUNTER — Ambulatory Visit: Payer: BLUE CROSS/BLUE SHIELD | Admitting: Physical Therapy

## 2017-06-26 ENCOUNTER — Encounter: Payer: Self-pay | Admitting: Physical Therapy

## 2017-06-26 DIAGNOSIS — M542 Cervicalgia: Secondary | ICD-10-CM | POA: Diagnosis not present

## 2017-06-26 DIAGNOSIS — G8929 Other chronic pain: Secondary | ICD-10-CM

## 2017-06-26 DIAGNOSIS — M25511 Pain in right shoulder: Secondary | ICD-10-CM

## 2017-06-26 NOTE — Therapy (Signed)
[facility] [address] [place], [place], [address] Phone: [phone]   Fax:  [phone]  Physical Therapy Treatment  Patient Details  Name: Amanda Strong MRN: [id number] Date of Birth: 1981-03-30 Referring Provider: Lynnea Ferrier, MD  Encounter Date: 06/26/2017      PT End of Session - 06/26/17 1556    Visit Number 3   Number of Visits 12   Date for PT Re-Evaluation 07/24/17   Authorization Type [employer]   PT Start Time 1505   PT Stop Time 1556   PT Time Calculation (min) 51 min   Activity Tolerance Patient tolerated treatment well   Behavior During Therapy [facility] for tasks assessed/performed      Past Medical History:  Diagnosis Date  . Abnormal Pap smear   . Acne   . Allergy   . Anxiety   . Depression   . Group B streptococcal infection in pregnancy 10/28/2012  . History of chicken pox   . Hypertension   . Hyperthyroidism   . LGSIL (low grade squamous intraepithelial dysplasia)    Dr. Senaida Ores  . Normal pregnancy, repeat 10/28/2012    Past Surgical History:  Procedure Laterality Date  . BUNIONECTOMY    . FOOT SURGERY    . WISDOM TOOTH EXTRACTION      There were no vitals filed for this visit.      Subjective Assessment - 06/26/17 1505    Subjective "I did feel alittle different after last session but not worse"   Currently in Pain? Yes   Pain Score 1    Pain Location Scapula   Pain Orientation Right   Pain Descriptors / Indicators Aching   Pain Type Chronic pain   Pain Onset More than a month ago   Pain Frequency Constant   Aggravating Factors  repetitive motions,                         [facility] Adult PT Treatment/Exercise - 06/26/17 0001      Neck Exercises: Seated   Neck Retraction 10 reps;5 secs     Neck Exercises: Sidelying   Other Sidelying Exercise book opening 2 x 10 bil     Manual Therapy   Joint Mobilization grade 3 1st rib mobs on the R , T1-T7 PA   Soft tissue  mobilization IASTM over the R scalene and upper trap     Neck Exercises: Stretches   Upper Trapezius Stretch 2 reps;30 seconds          Trigger Point Dry Needling - 06/26/17 1507    Consent Given? Yes   Education Handout Provided Yes   Muscles Treated Upper Body Oblique capitus;Upper trapezius;Suboccipitals muscle group   Upper Trapezius Response Twitch reponse elicited;Palpable increased muscle length  R   Oblique Capitus Response Twitch response elicited;Palpable increased muscle length  R   SubOccipitals Response Twitch response elicited;Palpable increased muscle length  R                PT Short Term Goals - 06/12/17 2029      PT SHORT TERM GOAL #1   Title Pt will be ind with initial HEP   Time 6   Period Weeks   Status New   Target Date 07/24/17           PT Long Term Goals - 06/12/17 2030      PT LONG TERM GOAL #1   Title Pt will decrease R  neck and shoulder pain to intermittent only   Time 6   Period Weeks   Status New   Target Date 07/24/17     PT LONG TERM GOAL #2   Title Pt will improve R cervical and shoulder AROM to WNL without increased pain   Time 6   Period Weeks   Status New   Target Date 07/24/17     PT LONG TERM GOAL #3   Title Pt will improve R shoulder and scapular strength to 4/5 or greater without increased pain   Time 6   Period Weeks   Status New   Target Date 07/24/17     PT LONG TERM GOAL #4   Title Pt will report decreased feelings of R UE "giving out" to 0-1x per week    Time 6   Period Weeks   Status New   Target Date 07/24/17               Plan - 06/26/17 1559    Clinical Impression Statement pt reported feeling different. continued DN with IASTM techniques and thoracic mobs. progressed exercise for thoracic mobility and self mobs which she reported decreased pain. continued MHP post session for soreness from DN   PT Treatment/Interventions Electrical Stimulation;Manual techniques;Dry  needling;Therapeutic exercise;Taping;Neuromuscular re-education;Patient/family education;Moist Heat   PT Next Visit Plan review HEP, How as DN begin R cervical/periscapular STM and modalities as needed, cervical/shoulder/postural strengthening R   PT Home Exercise Plan Given initial HEP 9/24 per handout, 1st rib mobs, book opening.    Consulted and Agree with Plan of Care Patient      Patient will benefit from skilled therapeutic intervention in order to improve the following deficits and impairments:  Postural dysfunction, Impaired UE functional use, Pain, Decreased strength  Visit Diagnosis: Cervicalgia  Chronic right shoulder pain     Problem List Patient Active Problem List   Diagnosis Date Noted  . LGSIL (low grade squamous intraepithelial dysplasia)   . SVD (spontaneous vaginal delivery) 10/29/2012  . Normal pregnancy, repeat 10/28/2012  . Group B streptococcal infection in pregnancy 10/28/2012  . Abnormal TSH 01/23/2012  . FLATULENCE ERUCTATION AND GAS PAIN 06/30/2010  . COSTOCHONDRITIS 10/21/2009  . WEIGHT GAIN 10/14/2009  . Major depression, chronic 05/08/2009  . CONSTIPATION, MILD 06/27/2008  . VAGINITIS, BACTERIAL 06/27/2008  . GAD (generalized anxiety disorder) 04/04/2008  . ALLERGIC RHINITIS 08/31/2007  . HYPERTENSION 04/27/2007  . ACNE NEC 04/27/2007   Lulu Riding PT, DPT, LAT, ATC  06/26/17  4:11 PM      [facility] [address] [place], [place], [address] Phone: [phone]   Fax:  [phone]  Name: Amanda Strong MRN: [id number] Date of Birth: April 19, 1981

## 2017-07-03 ENCOUNTER — Encounter: Payer: Self-pay | Admitting: Physical Therapy

## 2017-07-03 ENCOUNTER — Ambulatory Visit: Payer: BLUE CROSS/BLUE SHIELD | Admitting: Physical Therapy

## 2017-07-03 DIAGNOSIS — M542 Cervicalgia: Secondary | ICD-10-CM

## 2017-07-03 DIAGNOSIS — M25511 Pain in right shoulder: Secondary | ICD-10-CM

## 2017-07-03 DIAGNOSIS — G8929 Other chronic pain: Secondary | ICD-10-CM

## 2017-07-03 NOTE — Therapy (Addendum)
[facility] [place], [place], [address] Phone: [phone]   Fax:  [phone]  Physical Therapy Treatment/ Discharge Summary  Patient Details  Name: Amanda Strong MRN: [id number] Date of Birth: [birth year] Referring Provider: Jenna Luo, MD  Encounter Date: 07/03/2017      PT End of Session - 07/03/17 1543    Visit Number 4   Number of Visits 12   Date for PT Re-Evaluation 07/24/17   PT Start Time 1503   PT Stop Time 1551   PT Time Calculation (min) 48 min   Activity Tolerance Patient tolerated treatment well   Behavior During Therapy [facility] for tasks assessed/performed      Past Medical History:  Diagnosis Date  . Abnormal Pap smear   . Acne   . Allergy   . Anxiety   . Depression   . Group B streptococcal infection in pregnancy 10/28/2012  . History of chicken pox   . Hypertension   . Hyperthyroidism   . LGSIL (low grade squamous intraepithelial dysplasia)    Dr. Marvel Plan  . Normal pregnancy, repeat 10/28/2012    Past Surgical History:  Procedure Laterality Date  . BUNIONECTOMY    . FOOT SURGERY    . WISDOM TOOTH EXTRACTION      There were no vitals filed for this visit.      Subjective Assessment - 07/03/17 1506    Subjective "I am having a rough day due to kids and work, still having the N/T"    Currently in Pain? Yes   Pain Score 1    Pain Orientation Right   Pain Descriptors / Indicators Aching   Pain Type Chronic pain   Pain Onset More than a month ago   Pain Frequency Intermittent   Aggravating Factors  repetitive motions, and sleeping on the R                          [facility] Adult PT Treatment/Exercise - 07/03/17 1533      Neck Exercises: Supine   Neck Retraction 10 reps;10 secs  with head lift     Modalities   Modalities Moist Heat     Moist Heat Therapy   Number Minutes Moist Heat 10 Minutes   Moist Heat Location Cervical  supine     Manual  Therapy   Manual Therapy Neural Stretch;Manual Traction   Manual therapy comments manual trigger point release over upper trap/ levator scapulae   Soft tissue mobilization IASTM over the R scalene and upper trap   Manual Traction manual cervical traction with gentle upper cervial PROM rotation   Neural Stretch median nerve glides     Neck Exercises: Stretches   Upper Trapezius Stretch 2 reps;30 seconds                PT Education - 07/03/17 1516    Education provided Yes   Education Details how to perform manual trigger point relase and the tools that can be used to assist with treatment   Person(s) Educated Patient   Methods Explanation;Verbal cues   Comprehension Verbalized understanding;Verbal cues required          PT Short Term Goals - 06/12/17 2029      PT SHORT TERM GOAL #1   Title Pt will be ind with initial HEP   Time 6   Period Weeks   Status New   Target Date 07/24/17  PT Long Term Goals - 06/12/17 2030      PT LONG TERM GOAL #1   Title Pt will decrease R neck and shoulder pain to intermittent only   Time 6   Period Weeks   Status New   Target Date 07/24/17     PT LONG TERM GOAL #2   Title Pt will improve R cervical and shoulder AROM to WNL without increased pain   Time 6   Period Weeks   Status New   Target Date 07/24/17     PT LONG TERM GOAL #3   Title Pt will improve R shoulder and scapular strength to 4/5 or greater without increased pain   Time 6   Period Weeks   Status New   Target Date 07/24/17     PT LONG TERM GOAL #4   Title Pt will report decreased feelings of R UE "giving out" to 0-1x per week    Time 6   Period Weeks   Status New   Target Date 07/24/17               Plan - 07/03/17 1544    Clinical Impression Statement pt reports feeling more sore today in the neck with continued referral sx down the RUE. following chin tuck head lift exercise and median nerve glides she reported decreased pain as  well as N/T in to the RUE. continued MHP post session for soreness.   PT Treatment/Interventions Electrical Stimulation;Manual techniques;Dry needling;Therapeutic exercise;Taping;Neuromuscular re-education;Patient/family education;Moist Heat   PT Next Visit Plan review HEP, How as DN begin R cervical/periscapular STM and modalities as needed, cervical/shoulder/postural strengthening R   PT Home Exercise Plan Given initial HEP 9/24 per handout, 1st rib mobs, book opening. Median nerve glides, chin tuck head lift   Consulted and Agree with Plan of Care Patient      Patient will benefit from skilled therapeutic intervention in order to improve the following deficits and impairments:  Postural dysfunction, Impaired UE functional use, Pain, Decreased strength  Visit Diagnosis: Cervicalgia  Chronic right shoulder pain     Problem List Patient Active Problem List   Diagnosis Date Noted  . LGSIL (low grade squamous intraepithelial dysplasia)   . SVD (spontaneous vaginal delivery) 10/29/2012  . Normal pregnancy, repeat 10/28/2012  . Group B streptococcal infection in pregnancy 10/28/2012  . Abnormal TSH 01/23/2012  . FLATULENCE ERUCTATION AND GAS PAIN 06/30/2010  . COSTOCHONDRITIS 10/21/2009  . WEIGHT GAIN 10/14/2009  . Major depression, chronic 05/08/2009  . CONSTIPATION, MILD 06/27/2008  . VAGINITIS, BACTERIAL 06/27/2008  . GAD (generalized anxiety disorder) 04/04/2008  . ALLERGIC RHINITIS 08/31/2007  . HYPERTENSION 04/27/2007  . ACNE NEC 04/27/2007   Starr Lake PT, DPT, LAT, ATC  07/03/17  3:48 PM      [facility] [address] [place], [place], [address] Phone: [phone]   Fax:  [phone]  Name: TECORA Strong MRN: [id number] Date of Birth: 09/01/1981          PHYSICAL THERAPY DISCHARGE SUMMARY  Visits from Start of Care: 4  Current functional level related to goals / functional outcomes: See  goals   Remaining deficits: Unknown due to pt not returning   Education / Equipment: HEP, theraband  Plan: Patient agrees to discharge.  Patient goals were partially met. Patient is being discharged due to not returning since the last visit.  ?????     Teisha Trowbridge PT, DPT, LAT, ATC  08/29/17  3:14 PM

## 2017-07-07 ENCOUNTER — Other Ambulatory Visit: Payer: Self-pay | Admitting: Family Medicine

## 2017-07-10 ENCOUNTER — Ambulatory Visit: Payer: BLUE CROSS/BLUE SHIELD | Admitting: Physical Therapy

## 2017-08-08 ENCOUNTER — Ambulatory Visit (HOSPITAL_COMMUNITY): Payer: Self-pay | Admitting: Psychiatry

## 2017-08-08 ENCOUNTER — Ambulatory Visit (INDEPENDENT_AMBULATORY_CARE_PROVIDER_SITE_OTHER): Payer: BLUE CROSS/BLUE SHIELD | Admitting: Psychiatry

## 2017-08-08 ENCOUNTER — Other Ambulatory Visit: Payer: Self-pay | Admitting: Family Medicine

## 2017-08-08 ENCOUNTER — Encounter (HOSPITAL_COMMUNITY): Payer: Self-pay | Admitting: Psychiatry

## 2017-08-08 DIAGNOSIS — Z87891 Personal history of nicotine dependence: Secondary | ICD-10-CM | POA: Diagnosis not present

## 2017-08-08 DIAGNOSIS — K219 Gastro-esophageal reflux disease without esophagitis: Secondary | ICD-10-CM

## 2017-08-08 DIAGNOSIS — F3131 Bipolar disorder, current episode depressed, mild: Secondary | ICD-10-CM

## 2017-08-08 DIAGNOSIS — F411 Generalized anxiety disorder: Secondary | ICD-10-CM | POA: Diagnosis not present

## 2017-08-08 MED ORDER — LAMOTRIGINE 150 MG PO TABS: 300.0000 mg | ORAL_TABLET | Freq: Every day | ORAL | 0 refills | Status: DC

## 2017-08-08 MED ORDER — ALPRAZOLAM 0.25 MG PO TABS: 0.2500 mg | ORAL_TABLET | Freq: Two times a day (BID) | ORAL | 2 refills | Status: DC | PRN
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
## 2017-10-11 ENCOUNTER — Other Ambulatory Visit: Payer: Self-pay | Admitting: Family Medicine

## 2017-10-11 MED ORDER — LOSARTAN POTASSIUM-HCTZ 50-12.5 MG PO TABS: 1.0000 | ORAL_TABLET | Freq: Every day | ORAL | 3 refills | Status: DC

## 2017-11-09 ENCOUNTER — Ambulatory Visit (HOSPITAL_COMMUNITY): Payer: Self-pay | Admitting: Psychiatry

## 2017-11-20 ENCOUNTER — Ambulatory Visit (INDEPENDENT_AMBULATORY_CARE_PROVIDER_SITE_OTHER): Payer: 59 | Admitting: Psychiatry

## 2017-11-20 ENCOUNTER — Encounter (HOSPITAL_COMMUNITY): Payer: Self-pay | Admitting: Psychiatry

## 2017-11-20 DIAGNOSIS — Z563 Stressful work schedule: Secondary | ICD-10-CM

## 2017-11-20 DIAGNOSIS — F3131 Bipolar disorder, current episode depressed, mild: Secondary | ICD-10-CM

## 2017-11-20 DIAGNOSIS — F411 Generalized anxiety disorder: Secondary | ICD-10-CM | POA: Diagnosis not present

## 2017-11-20 DIAGNOSIS — Z975 Presence of (intrauterine) contraceptive device: Secondary | ICD-10-CM

## 2017-11-20 DIAGNOSIS — Z811 Family history of alcohol abuse and dependence: Secondary | ICD-10-CM

## 2017-11-20 DIAGNOSIS — Z87891 Personal history of nicotine dependence: Secondary | ICD-10-CM

## 2017-11-20 MED ORDER — LAMOTRIGINE 150 MG PO TABS: 300.0000 mg | ORAL_TABLET | Freq: Every day | ORAL | 0 refills | Status: DC

## 2017-11-20 MED ORDER — ALPRAZOLAM 0.25 MG PO TABS: 0.2500 mg | ORAL_TABLET | Freq: Two times a day (BID) | ORAL | 2 refills | Status: DC | PRN
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
## 2018-01-04 ENCOUNTER — Telehealth: Payer: Self-pay

## 2018-01-04 NOTE — Telephone Encounter (Signed)
Fax received from pharmacy losartan-hctz is on back order would like to know if they can separate Pls advise

## 2018-01-04 NOTE — Telephone Encounter (Signed)
Sure, ok to separate into individual meds.

## 2018-01-08 MED ORDER — HYDROCHLOROTHIAZIDE 12.5 MG PO TABS: 12.5000 mg | ORAL_TABLET | Freq: Every day | ORAL | 0 refills | Status: DC

## 2018-01-08 MED ORDER — LOSARTAN POTASSIUM 50 MG PO TABS: 50.0000 mg | ORAL_TABLET | Freq: Every day | ORAL | 0 refills | Status: DC

## 2018-01-08 NOTE — Telephone Encounter (Signed)
Change has been made.  

## 2018-02-20 ENCOUNTER — Ambulatory Visit (HOSPITAL_COMMUNITY): Payer: Self-pay | Admitting: Psychiatry

## 2018-02-28 ENCOUNTER — Encounter (HOSPITAL_COMMUNITY): Payer: Self-pay | Admitting: Psychiatry

## 2018-02-28 ENCOUNTER — Ambulatory Visit (INDEPENDENT_AMBULATORY_CARE_PROVIDER_SITE_OTHER): Payer: 59 | Admitting: Psychiatry

## 2018-02-28 DIAGNOSIS — O24419 Gestational diabetes mellitus in pregnancy, unspecified control: Secondary | ICD-10-CM | POA: Insufficient documentation

## 2018-02-28 DIAGNOSIS — F3131 Bipolar disorder, current episode depressed, mild: Secondary | ICD-10-CM | POA: Diagnosis not present

## 2018-02-28 DIAGNOSIS — F411 Generalized anxiety disorder: Secondary | ICD-10-CM

## 2018-02-28 DIAGNOSIS — E0590 Thyrotoxicosis, unspecified without thyrotoxic crisis or storm: Secondary | ICD-10-CM | POA: Insufficient documentation

## 2018-02-28 DIAGNOSIS — Z91411 Personal history of adult psychological abuse: Secondary | ICD-10-CM

## 2018-02-28 DIAGNOSIS — Z87891 Personal history of nicotine dependence: Secondary | ICD-10-CM

## 2018-02-28 DIAGNOSIS — R454 Irritability and anger: Secondary | ICD-10-CM | POA: Diagnosis not present

## 2018-02-28 DIAGNOSIS — Z975 Presence of (intrauterine) contraceptive device: Secondary | ICD-10-CM

## 2018-02-28 MED ORDER — LAMOTRIGINE 150 MG PO TABS: 300.0000 mg | ORAL_TABLET | Freq: Every day | ORAL | 0 refills | Status: DC

## 2018-02-28 MED ORDER — ALPRAZOLAM 0.25 MG PO TABS: 0.2500 mg | ORAL_TABLET | Freq: Two times a day (BID) | ORAL | 2 refills | Status: DC | PRN
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
## 2018-04-24 ENCOUNTER — Other Ambulatory Visit: Payer: Self-pay | Admitting: Family Medicine

## 2018-06-04 ENCOUNTER — Ambulatory Visit (HOSPITAL_COMMUNITY): Payer: Self-pay | Admitting: Psychiatry

## 2018-06-23 ENCOUNTER — Other Ambulatory Visit (HOSPITAL_COMMUNITY): Payer: Self-pay | Admitting: Psychiatry

## 2018-06-23 DIAGNOSIS — F3131 Bipolar disorder, current episode depressed, mild: Secondary | ICD-10-CM

## 2018-07-12 ENCOUNTER — Other Ambulatory Visit (HOSPITAL_COMMUNITY): Payer: Self-pay

## 2018-07-12 DIAGNOSIS — F411 Generalized anxiety disorder: Secondary | ICD-10-CM

## 2018-07-12 MED ORDER — ALPRAZOLAM 0.25 MG PO TABS: 0.2500 mg | ORAL_TABLET | Freq: Two times a day (BID) | ORAL | 0 refills | Status: DC | PRN

## 2018-07-17 ENCOUNTER — Ambulatory Visit (HOSPITAL_COMMUNITY): Payer: Self-pay | Admitting: Psychiatry

## 2018-07-19 ENCOUNTER — Encounter (HOSPITAL_COMMUNITY): Payer: Self-pay | Admitting: Psychiatry

## 2018-07-19 ENCOUNTER — Ambulatory Visit (INDEPENDENT_AMBULATORY_CARE_PROVIDER_SITE_OTHER): Payer: 59 | Admitting: Psychiatry

## 2018-07-19 VITALS — BP 124/86 | HR 72 | Ht 67.0 in | Wt 139.0 lb

## 2018-07-19 DIAGNOSIS — F411 Generalized anxiety disorder: Secondary | ICD-10-CM | POA: Diagnosis not present

## 2018-07-19 DIAGNOSIS — F3131 Bipolar disorder, current episode depressed, mild: Secondary | ICD-10-CM | POA: Diagnosis not present

## 2018-07-19 MED ORDER — QUETIAPINE FUMARATE 50 MG PO TABS: ORAL_TABLET | ORAL | 1 refills | Status: DC
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
## 2018-08-12 ENCOUNTER — Other Ambulatory Visit (HOSPITAL_COMMUNITY): Payer: Self-pay | Admitting: Psychiatry

## 2018-08-12 DIAGNOSIS — F411 Generalized anxiety disorder: Secondary | ICD-10-CM

## 2018-08-12 DIAGNOSIS — F3131 Bipolar disorder, current episode depressed, mild: Secondary | ICD-10-CM

## 2018-08-15 ENCOUNTER — Other Ambulatory Visit: Payer: Self-pay | Admitting: Family Medicine

## 2018-08-15 MED ORDER — LOSARTAN POTASSIUM 100 MG PO TABS: 50.0000 mg | ORAL_TABLET | Freq: Every day | ORAL | 1 refills | Status: DC

## 2018-08-15 MED ORDER — HYDROCHLOROTHIAZIDE 12.5 MG PO TABS: 12.5000 mg | ORAL_TABLET | Freq: Every day | ORAL | 1 refills | Status: DC

## 2018-08-20 ENCOUNTER — Telehealth (HOSPITAL_COMMUNITY): Payer: Self-pay

## 2018-08-20 MED ORDER — HYDROXYZINE HCL 25 MG PO TABS: 25.0000 mg | ORAL_TABLET | Freq: Every evening | ORAL | 0 refills | Status: DC | PRN

## 2018-08-20 NOTE — Telephone Encounter (Signed)
Patient called and said that she has been taking the Seroquel now for a couple of weeks, she is unable to take a whole 25 mg and has been taking 1/2 of it. She states she is still waking up groggy, she feels like she is really sluggish for 3-4 hours after waking up. Patient would like to know if she can get a different medication to help her sleep. Please review and advise, thank you

## 2018-08-20 NOTE — Telephone Encounter (Signed)
I spoke to the patient.  [name] feeling very groggy with Seroquel 25 mg.  [name] no longer taking Xanax.  I recommended to try hydroxyzine which she do not recall taken in the past.  We will start hydroxyzine 25 mg and she can take half to 1 tablet and even repeat one more time if needed for insomnia.  I recommended to call us back if she did not feel any improvement. I called hydroxyzine prescription to her local pharmacy.

## 2018-09-20 ENCOUNTER — Other Ambulatory Visit (HOSPITAL_COMMUNITY): Payer: Self-pay | Admitting: Psychiatry

## 2018-09-21 ENCOUNTER — Telehealth (HOSPITAL_COMMUNITY): Payer: Self-pay

## 2018-09-21 DIAGNOSIS — F411 Generalized anxiety disorder: Secondary | ICD-10-CM

## 2018-09-21 MED ORDER — ALPRAZOLAM 0.5 MG PO TABS: 0.5000 mg | ORAL_TABLET | Freq: Two times a day (BID) | ORAL | 0 refills | Status: DC | PRN

## 2018-09-21 NOTE — Telephone Encounter (Signed)
Called patient and advised her to stop the Seroquel and let her know I called in the Xanax prescription

## 2018-09-21 NOTE — Telephone Encounter (Signed)
Discontinue Seroquel.  She can go back to previous Xanax and we will discuss more on her next appointment.

## 2018-09-21 NOTE — Telephone Encounter (Signed)
I spoke to patient and she said that when her prescription of Seroquel ran out she did not refill, she states the side effects were too much. Patient is scheduled to come in and see you in February and she wants to know if she can go back to the Xanax until then. Please review and advise, thank you

## 2018-10-18 ENCOUNTER — Other Ambulatory Visit: Payer: Self-pay | Admitting: Family Medicine

## 2018-11-01 ENCOUNTER — Encounter (HOSPITAL_COMMUNITY): Payer: Self-pay | Admitting: Psychiatry

## 2018-11-01 ENCOUNTER — Ambulatory Visit (INDEPENDENT_AMBULATORY_CARE_PROVIDER_SITE_OTHER): Payer: 59 | Admitting: Psychiatry

## 2018-11-01 DIAGNOSIS — F3131 Bipolar disorder, current episode depressed, mild: Secondary | ICD-10-CM

## 2018-11-01 DIAGNOSIS — F411 Generalized anxiety disorder: Secondary | ICD-10-CM | POA: Diagnosis not present

## 2018-11-01 MED ORDER — ALPRAZOLAM 0.5 MG PO TABS: 0.5000 mg | ORAL_TABLET | Freq: Two times a day (BID) | ORAL | 1 refills | Status: DC | PRN

## 2018-11-01 MED ORDER — LAMOTRIGINE 150 MG PO TABS: 300.0000 mg | ORAL_TABLET | Freq: Every day | ORAL | 0 refills | Status: DC

## 2018-11-01 MED ORDER — FLUOXETINE HCL 10 MG PO CAPS: 10.0000 mg | ORAL_CAPSULE | Freq: Every day | ORAL | 1 refills | Status: DC
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
## 2018-11-02 ENCOUNTER — Other Ambulatory Visit (HOSPITAL_COMMUNITY): Payer: Self-pay | Admitting: Psychiatry

## 2018-11-02 DIAGNOSIS — F411 Generalized anxiety disorder: Secondary | ICD-10-CM

## 2018-11-14 ENCOUNTER — Ambulatory Visit (INDEPENDENT_AMBULATORY_CARE_PROVIDER_SITE_OTHER): Payer: 59 | Admitting: Psychiatry

## 2018-11-14 DIAGNOSIS — F3131 Bipolar disorder, current episode depressed, mild: Secondary | ICD-10-CM | POA: Diagnosis not present

## 2018-11-14 NOTE — Progress Notes (Deleted)
Depressed, functional depressed person, easily in her feelings about things, single parent of 2, son was easy, daughter lots of issues, novas dad was a bad relationship, exceptionally intenigent, dominante personality, grown in patience, its hard to always be on, going to dr. Rosezella Rumpf to understand her, dad was 27 65, gpa 6, son 12-13 in April, always play mediator, mom and dad are ony outlet and support people, mom guilt, current boyfriend, communication is not working with him, hard to detach from him, want to learn how to detach, mental health from dad, almost adorted daughter, has experienceing low points, getting tattos for therapy, updet that parents didn't intervene, got set off at work, then got pregnant with NOVA, still not sure what was going on, suicidal thoughts, but no plan, wanted to be there for her son, some days wish didn't have to go home, would like son to come, served papers to reevaluate after not being involved for 5 years, his family involved but not, didn't step up, trust issues, cousin is her during the day support, may be placed somewhere else, may be changing work place,

## 2018-11-16 ENCOUNTER — Encounter (HOSPITAL_COMMUNITY): Payer: Self-pay | Admitting: Psychiatry
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
## 2018-11-23 MED FILL — HYDROCHLOROTHIAZIDE 12.5 MG: 12.5 | 90 days supply | Qty: 90 | Fill #0 | Status: TO

## 2018-11-23 MED FILL — LOSARTAN POTASSIUM 50 MG TA: 50 | 90 days supply | Qty: 90 | Fill #0 | Status: TO

## 2018-11-28 ENCOUNTER — Encounter (HOSPITAL_COMMUNITY): Payer: Self-pay | Admitting: Psychiatry

## 2018-11-28 ENCOUNTER — Ambulatory Visit (INDEPENDENT_AMBULATORY_CARE_PROVIDER_SITE_OTHER): Payer: 59 | Admitting: Psychiatry

## 2018-11-28 ENCOUNTER — Other Ambulatory Visit: Payer: Self-pay

## 2018-11-28 DIAGNOSIS — F3131 Bipolar disorder, current episode depressed, mild: Secondary | ICD-10-CM | POA: Diagnosis not present
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
## 2018-12-04 ENCOUNTER — Telehealth (HOSPITAL_COMMUNITY): Payer: Self-pay

## 2018-12-04 NOTE — Telephone Encounter (Signed)
Patient is calling to let you know that the Lyrica is not working. Patient states that she has been on it for 30 days and the side effects are bothersome, patient states the sexual side effects are bad and she would like to know what you recommend. Please review and advise, thank you

## 2018-12-04 NOTE — Telephone Encounter (Signed)
You are correct, patient gave me the wrong drug. I called her back and advised her to stop the Prozac, she is already on the Xanax and has enough.

## 2018-12-04 NOTE — Telephone Encounter (Signed)
We never prescribed Lyrica, we prescribed Prozac.  I also recommend to try Xanax 0.5 mg twice a day.  If she is taking Xanax twice a day and is still not helping that she can try BuSpar 5 mg twice a day.  Stop the Prozac.

## 2018-12-12 ENCOUNTER — Ambulatory Visit (INDEPENDENT_AMBULATORY_CARE_PROVIDER_SITE_OTHER): Payer: 59 | Admitting: Psychiatry

## 2018-12-12 ENCOUNTER — Encounter (HOSPITAL_COMMUNITY): Payer: Self-pay | Admitting: Psychiatry

## 2018-12-12 ENCOUNTER — Other Ambulatory Visit: Payer: Self-pay

## 2018-12-12 DIAGNOSIS — F3131 Bipolar disorder, current episode depressed, mild: Secondary | ICD-10-CM | POA: Diagnosis not present

## 2018-12-12 DIAGNOSIS — F411 Generalized anxiety disorder: Secondary | ICD-10-CM
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
## 2018-12-14 ENCOUNTER — Telehealth (HOSPITAL_COMMUNITY): Payer: Self-pay

## 2018-12-14 DIAGNOSIS — F411 Generalized anxiety disorder: Secondary | ICD-10-CM

## 2018-12-14 MED ORDER — FLUOXETINE HCL 20 MG PO CAPS: 20.0000 mg | ORAL_CAPSULE | Freq: Every day | ORAL | 0 refills | Status: DC

## 2018-12-14 NOTE — Telephone Encounter (Signed)
Patient called and she is in tears, she wants to speak with you about starting something for depression. She wants you to call her.

## 2018-12-14 NOTE — Telephone Encounter (Signed)
I returned patient's phone call.  She apologized about mixing the medication.  Earlier she called that Lyrica is not working and when we clarify that we never prescribed Lyrica then she told Prozac not working but actually Prozac was working until she ran out after 30 days.  She like to try higher dose of Prozac because she tolerated the Prozac well but the dose was low.  I will call Prozac 20 mg daily.  Reminded side effects and if she develop any side effect then she should call us.  I recommended to keep her appointment with [place] coming up in first week of April.  A new prescription of Prozac 20 mg [facility].

## 2018-12-21 DIAGNOSIS — Z113 Encounter for screening for infections with a predominantly sexual mode of transmission: Secondary | ICD-10-CM | POA: Diagnosis not present

## 2019-01-01 ENCOUNTER — Other Ambulatory Visit: Payer: Self-pay

## 2019-01-01 ENCOUNTER — Ambulatory Visit (INDEPENDENT_AMBULATORY_CARE_PROVIDER_SITE_OTHER): Payer: 59 | Admitting: Psychiatry

## 2019-01-01 DIAGNOSIS — F411 Generalized anxiety disorder: Secondary | ICD-10-CM

## 2019-01-01 DIAGNOSIS — F3131 Bipolar disorder, current episode depressed, mild: Secondary | ICD-10-CM

## 2019-01-01 MED ORDER — FLUOXETINE HCL 20 MG PO CAPS: 20.0000 mg | ORAL_CAPSULE | Freq: Every day | ORAL | 0 refills | Status: DC

## 2019-01-01 MED ORDER — ALPRAZOLAM 0.5 MG PO TABS: 0.5000 mg | ORAL_TABLET | Freq: Every day | ORAL | 0 refills | Status: DC | PRN

## 2019-01-01 MED ORDER — LAMOTRIGINE 150 MG PO TABS: 300.0000 mg | ORAL_TABLET | Freq: Every day | ORAL | 0 refills | Status: DC
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
## 2019-01-08 ENCOUNTER — Other Ambulatory Visit (HOSPITAL_COMMUNITY): Payer: Self-pay | Admitting: Psychiatry

## 2019-01-08 ENCOUNTER — Other Ambulatory Visit (HOSPITAL_COMMUNITY): Payer: Self-pay

## 2019-01-08 DIAGNOSIS — F411 Generalized anxiety disorder: Secondary | ICD-10-CM

## 2019-01-08 MED ORDER — FLUOXETINE HCL 20 MG PO CAPS: 20.0000 mg | ORAL_CAPSULE | Freq: Every day | ORAL | 0 refills | Status: DC

## 2019-01-08 MED FILL — FLUoxetine HCL 20 MG CAPS: 20 | 90 days supply | Qty: 90 | Fill #0

## 2019-02-05 ENCOUNTER — Other Ambulatory Visit (HOSPITAL_COMMUNITY): Payer: Self-pay | Admitting: Psychiatry

## 2019-02-05 DIAGNOSIS — F3131 Bipolar disorder, current episode depressed, mild: Secondary | ICD-10-CM

## 2019-02-06 ENCOUNTER — Telehealth (HOSPITAL_COMMUNITY): Payer: Self-pay

## 2019-02-06 ENCOUNTER — Other Ambulatory Visit (HOSPITAL_COMMUNITY): Payer: Self-pay

## 2019-02-06 DIAGNOSIS — F3131 Bipolar disorder, current episode depressed, mild: Secondary | ICD-10-CM

## 2019-02-06 MED ORDER — LAMOTRIGINE 150 MG PO TABS: 300.0000 mg | ORAL_TABLET | Freq: Every day | ORAL | 0 refills | Status: DC

## 2019-02-06 MED FILL — SUBVENITE 150 MG TABS: 150 | 90 days supply | Qty: 180 | Fill #0

## 2019-02-06 NOTE — Telephone Encounter (Signed)
Patient called regarding her Lamictal 150mg . [facility] but [facility] was notified that insurance would not cover it there and that it would need to be filled at [facility]. I spoke with [facility] and filled it there. They are notifying patient.

## 2019-02-07 ENCOUNTER — Other Ambulatory Visit (HOSPITAL_COMMUNITY): Payer: Self-pay | Admitting: Psychiatry

## 2019-02-07 DIAGNOSIS — F411 Generalized anxiety disorder: Secondary | ICD-10-CM

## 2019-02-08 MED FILL — HYDROCHLOROTHIAZIDE 12.5 MG: 12.5 | 90 days supply | Qty: 90 | Fill #0

## 2019-02-08 MED FILL — LOSARTAN POTASSIUM 50 MG TA: 50 | 90 days supply | Qty: 90 | Fill #0

## 2019-02-13 ENCOUNTER — Other Ambulatory Visit (HOSPITAL_COMMUNITY): Payer: Self-pay

## 2019-02-13 DIAGNOSIS — F411 Generalized anxiety disorder: Secondary | ICD-10-CM

## 2019-02-13 MED ORDER — ALPRAZOLAM 0.5 MG PO TABS: 0.5000 mg | ORAL_TABLET | Freq: Every day | ORAL | 0 refills | Status: DC | PRN

## 2019-02-25 ENCOUNTER — Ambulatory Visit (INDEPENDENT_AMBULATORY_CARE_PROVIDER_SITE_OTHER): Payer: 59 | Admitting: Family Medicine

## 2019-02-25 ENCOUNTER — Other Ambulatory Visit: Payer: Self-pay

## 2019-02-25 ENCOUNTER — Encounter: Payer: Self-pay | Admitting: Family Medicine

## 2019-02-25 VITALS — BP 130/88 | HR 96 | Temp 98.4°F | Resp 15 | Wt 190.0 lb

## 2019-02-25 DIAGNOSIS — Z0000 Encounter for general adult medical examination without abnormal findings: Secondary | ICD-10-CM

## 2019-02-25 DIAGNOSIS — I10 Essential (primary) hypertension: Secondary | ICD-10-CM | POA: Diagnosis not present

## 2019-02-25 DIAGNOSIS — R635 Abnormal weight gain: Secondary | ICD-10-CM

## 2019-02-25 DIAGNOSIS — Z0001 Encounter for general adult medical examination with abnormal findings: Secondary | ICD-10-CM | POA: Diagnosis not present

## 2019-02-25 NOTE — Progress Notes (Signed)
Subjective:    Patient ID: Amanda Strong, female    DOB: 1981-07-16, 38 y.o.   MRN: [id number]  HPI Patient is here today for complete physical exam.  Since I last saw the patient, she is gained considerable amount of weight.  She believes she has gained 30 to 40 pounds in the last 12 months.  Over that time she has been seeing a psychiatrist who has been treating her depression with Lamictal and Prozac as well as some other medications.  These may potentially be causing weight gain.  She also has a history of gestational diabetes as well as an abnormal TSH and she is concerned that she may be developing diabetes or that she may have thyroid issues.  She sees a gynecologist who performs her Pap smears.  She has that scheduled for later this year.  She also [facility] for mammograms and this is scheduled later this year because of her family history of breast cancer in her mother.  Otherwise she is doing well with no concerns. Past Medical History:  Diagnosis Date  . Abnormal Pap smear   . Acne   . Allergy   . Anxiety   . Depression   . Group B streptococcal infection in pregnancy 10/28/2012  . History of chicken pox   . Hypertension   . Hyperthyroidism   . LGSIL (low grade squamous intraepithelial dysplasia)    Dr. Senaida Oresichardson  . Normal pregnancy, repeat 10/28/2012   Past Surgical History:  Procedure Laterality Date  . BUNIONECTOMY    . FOOT SURGERY    . WISDOM TOOTH EXTRACTION     Current Outpatient Medications on File Prior to Visit  Medication Sig Dispense Refill  . ALPRAZolam (XANAX) 0.5 MG tablet Take 1 tablet (0.5 mg total) by mouth daily as needed for anxiety. 30 tablet 0  . FLUoxetine (PROZAC) 20 MG capsule Take 1 capsule (20 mg total) by mouth daily. 90 capsule 0  . hydrochlorothiazide (HYDRODIURIL) 12.5 MG tablet Take 1 tablet (12.5 mg total) by mouth daily. 90 tablet 1  . lamoTRIgine (LAMICTAL) 150 MG tablet Take 2 tablets (300 mg total) by mouth  daily. 180 tablet 0  . levocetirizine (XYZAL) 5 MG tablet Take 5 mg by mouth daily.  3  . levonorgestrel (MIRENA, 52 MG,) 20 MCG/24HR IUD Mirena 20 mcg/24 hours (5 yrs) 52 mg intrauterine device    . losartan (COZAAR) 100 MG tablet Take 0.5 tablets (50 mg total) by mouth daily. 45 tablet 1  . montelukast (SINGULAIR) 10 MG tablet Take 10 mg by mouth at bedtime.    Marland Kitchen. losartan-hydrochlorothiazide (HYZAAR) 50-12.5 MG tablet TAKE 1 TABLET BY MOUTH EVERY DAY (Patient not taking: Reported on 02/25/2019) 90 tablet 3  . omeprazole (PRILOSEC) 40 MG capsule TAKE ONE CAPSULE BY MOUTH EVERY DAY (Patient not taking: Reported on 11/01/2018) 90 capsule 3  . [DISCONTINUED] desvenlafaxine (PRISTIQ) 50 MG 24 hr tablet Take 1 tablet (50 mg total) by mouth daily. 21 tablet 0  . [DISCONTINUED] olmesartan-hydrochlorothiazide (BENICAR HCT) 20-12.5 MG per tablet Take 1 tablet by mouth daily.       No current facility-administered medications on file prior to visit.    Allergies  Allergen Reactions  . Cephalexin     REACTION: intense itching all over  . Percocet [Oxycodone-Acetaminophen] Itching   Social History   Socioeconomic History  . Marital status: Single    Spouse name: Not on file  . Number of children: 1  .  Years of education: 8  . Highest education level: Not on file  Occupational History  . Occupation: WOUND CARE Occupational hygienist: [employer]  Social Needs  . Financial resource strain: Not on file  . Food insecurity:    Worry: Not on file    Inability: Not on file  . Transportation needs:    Medical: Not on file    Non-medical: Not on file  Tobacco Use  . Smoking status: Former Smoker    Last attempt to quit: 10/22/2008    Years since quitting: 10.3  . Smokeless tobacco: Never Used  Substance and Sexual Activity  . Alcohol use: No    Alcohol/week: 0.0 standard drinks    Comment: occasionally  . Drug use: No  . Sexual activity: Yes    Partners: Male    Birth  control/protection: Condom, I.U.D.  Lifestyle  . Physical activity:    Days per week: Not on file    Minutes per session: Not on file  . Stress: Not on file  Relationships  . Social connections:    Talks on phone: Not on file    Gets together: Not on file    Attends religious service: Not on file    Active member of club or organization: Not on file    Attends meetings of clubs or organizations: Not on file    Relationship status: Not on file  . Intimate partner violence:    Fear of current or ex partner: Not on file    Emotionally abused: Not on file    Physically abused: Not on file    Forced sexual activity: Not on file  Other Topics Concern  . Not on file  Social History Narrative   Caffeine Use-yes   Regular exercise-no      Entered 03/2014:   Single Mom of 2 children   Children are:      Girl--52 months old      Boy--38 years old   She works at [employer].    Currently works with wound care   Family History  Problem Relation Age of Onset  . Hypertension Mother   . Cancer Mother        breast  . Hypertension Father   . Arthritis Father   . Hypertension Maternal Grandmother   . Arthritis Maternal Grandmother   . Hyperlipidemia Maternal Grandmother   . Stroke Maternal Grandfather   . Alcohol abuse Maternal Grandfather   . Arthritis Maternal Grandfather   . Hypertension Paternal Grandmother   . Heart disease Paternal Grandmother   . Hypertension Paternal Grandfather   . Hypertension Maternal Aunt   . Hypertension Maternal Uncle   . Hypertension Paternal Aunt   . Hypertension Paternal Uncle       Review of Systems  All other systems reviewed and are negative.      Objective:   Physical Exam  Constitutional: [name] oriented to person, place, and time. She appears well-developed and well-nourished. No distress.  HENT:  Head: Normocephalic and atraumatic.  Right Ear: External ear normal.  Left Ear: External ear normal.  Nose: Nose normal.   Mouth/Throat: Oropharynx is clear and moist. No oropharyngeal exudate.  Eyes: Pupils are equal, round, and reactive to light. Conjunctivae and EOM are normal. Right eye exhibits no discharge. Left eye exhibits no discharge. No scleral icterus.  Neck: Normal range of motion. Neck supple. No JVD present. No tracheal deviation present. No thyromegaly present.  Cardiovascular: Normal  rate, regular rhythm, normal heart sounds and intact distal pulses. Exam reveals no gallop and no friction rub.  No murmur heard. Pulmonary/Chest: Effort normal and breath sounds normal. No stridor. No respiratory distress. She has no wheezes. She has no rales. She exhibits no tenderness.  Abdominal: Soft. Bowel sounds are normal. She exhibits no distension and no mass. There is no abdominal tenderness. There is no rebound and no guarding.  Musculoskeletal: Normal range of motion.        General: No tenderness, deformity or edema.  Lymphadenopathy:    She has no cervical adenopathy.  Neurological: [name] alert and oriented to person, place, and time. She has normal reflexes. No cranial nerve deficit. She exhibits normal muscle tone. Coordination normal.  Skin: Skin is warm. No rash noted. [name] not diaphoretic. No erythema. No pallor.  Psychiatric: She has a normal mood and affect. Her behavior is normal. Judgment and thought content normal.  Vitals reviewed.         Assessment & Plan:  General medical exam - Plan: CBC with Differential/Platelet, COMPLETE METABOLIC PANEL WITH GFR, Lipid panel, TSH  Weight gain - Plan: CBC with Differential/Platelet, COMPLETE METABOLIC PANEL WITH GFR, Lipid panel, TSH  Essential hypertension  Benign essential HTN [name] physical exam today is normal.  Her blood pressures acceptable.  Given her recent weight gain, I will check a TSH to rule out hypothyroidism.  Given her history of gestational diabetes, I will also check a CMP to monitor her fasting blood sugar and if  elevated will check a hemoglobin A1c.  In addition I will check a CBC, and a fasting lipid panel.  I have encouraged her to follow-up with her gynecologist for Pap smear as well as for her mammogram.  Given her history of severe acne and irregular periods, it may also potentially be due to polycystic ovary syndrome.  However I believe the most likely explanation could be medication and I recommended she discuss this with her psychiatrist as the medicine she is taking for her depression are prone to cause weight gain

## 2019-02-26 LAB — COMPLETE METABOLIC PANEL WITH GFR
AG Ratio: 1.7 (calc) (ref 1.0–2.5)
ALT: 16 U/L (ref 6–29)
AST: 16 U/L (ref 10–30)
Albumin: 4.1 g/dL (ref 3.6–5.1)
Alkaline phosphatase (APISO): 48 U/L (ref 31–125)
BUN: 15 mg/dL (ref 7–25)
CO2: 27 mmol/L (ref 20–32)
Calcium: 9.2 mg/dL (ref 8.6–10.2)
Chloride: 100 mmol/L (ref 98–110)
Creat: 0.84 mg/dL (ref 0.50–1.10)
GFR, Est African American: 103 mL/min/{1.73_m2} (ref >=60)
GFR, Est Non African American: 89 mL/min/{1.73_m2} (ref >=60)
Globulin: 2.4 g/dL (calc) (ref 1.9–3.7)
Glucose, Bld: 100 mg/dL — ABNORMAL HIGH (ref 65–99)
Potassium: 4 mmol/L (ref 3.5–5.3)
Sodium: 135 mmol/L (ref 135–146)
Total Bilirubin: 0.5 mg/dL (ref 0.2–1.2)
Total Protein: 6.5 g/dL (ref 6.1–8.1)

## 2019-02-26 LAB — LIPID PANEL
Cholesterol: 149 mg/dL (ref <200)
HDL: 61 mg/dL (ref >=50)
LDL Cholesterol (Calc): 74 mg/dL (calc)
Non-HDL Cholesterol (Calc): 88 mg/dL (calc) (ref <130)
Total CHOL/HDL Ratio: 2.4 (calc) (ref <5.0)
Triglycerides: 64 mg/dL (ref <150)

## 2019-02-26 LAB — CBC WITH DIFFERENTIAL/PLATELET
Absolute Monocytes: 842 cells/uL (ref 200–950)
Basophils Absolute: 30 cells/uL (ref 0–200)
Basophils Relative: 0.3 %
Eosinophils Absolute: 248 cells/uL (ref 15–500)
Eosinophils Relative: 2.5 %
HCT: 37 % (ref 35.0–45.0)
Hemoglobin: 12.8 g/dL (ref 11.7–15.5)
Lymphs Abs: 1782 cells/uL (ref 850–3900)
MCH: 31.4 pg (ref 27.0–33.0)
MCHC: 34.6 g/dL (ref 32.0–36.0)
MCV: 90.9 fL (ref 80.0–100.0)
MPV: 11.4 fL (ref 7.5–12.5)
Monocytes Relative: 8.5 %
Neutro Abs: 6999 cells/uL (ref 1500–7800)
Neutrophils Relative %: 70.7 %
Platelets: 278 10*3/uL (ref 140–400)
RBC: 4.07 10*6/uL (ref 3.80–5.10)
RDW: 12.1 % (ref 11.0–15.0)
Total Lymphocyte: 18 %
WBC: 9.9 10*3/uL (ref 3.8–10.8)

## 2019-02-26 LAB — TSH: TSH: 1 mIU/L

## 2019-03-13 ENCOUNTER — Other Ambulatory Visit (HOSPITAL_COMMUNITY): Payer: Self-pay

## 2019-03-13 DIAGNOSIS — F411 Generalized anxiety disorder: Secondary | ICD-10-CM

## 2019-03-13 MED ORDER — ALPRAZOLAM 0.5 MG PO TABS: 0.5000 mg | ORAL_TABLET | Freq: Every day | ORAL | 0 refills | Status: DC | PRN

## 2019-03-27 ENCOUNTER — Telehealth: Payer: Self-pay | Admitting: Family Medicine

## 2019-03-27 NOTE — Telephone Encounter (Signed)
Pt called and wanted to know if you would prescribe phentermine again for her? She stopped taking the prozac as you and her had discussed it may have contributed to her weight gain.  Last refill was in 2018

## 2019-03-28 ENCOUNTER — Other Ambulatory Visit: Payer: Self-pay | Admitting: Family Medicine

## 2019-03-28 MED ORDER — PHENTERMINE HCL 37.5 MG PO TABS: 37.5000 mg | ORAL_TABLET | Freq: Every day | ORAL | 2 refills | Status: DC

## 2019-03-28 NOTE — Telephone Encounter (Signed)
Ok, I escribe.

## 2019-03-28 NOTE — Telephone Encounter (Signed)
Pt aware via vm 

## 2019-04-10 ENCOUNTER — Ambulatory Visit (HOSPITAL_COMMUNITY): Payer: 59 | Admitting: Psychiatry

## 2019-04-12 ENCOUNTER — Ambulatory Visit (HOSPITAL_COMMUNITY): Payer: 59 | Admitting: Psychiatry

## 2019-04-12 ENCOUNTER — Other Ambulatory Visit (HOSPITAL_COMMUNITY): Payer: Self-pay

## 2019-04-12 DIAGNOSIS — F411 Generalized anxiety disorder: Secondary | ICD-10-CM

## 2019-04-12 MED ORDER — ALPRAZOLAM 0.5 MG PO TABS: 0.5000 mg | ORAL_TABLET | Freq: Every day | ORAL | 0 refills | Status: DC | PRN

## 2019-04-17 ENCOUNTER — Ambulatory Visit (INDEPENDENT_AMBULATORY_CARE_PROVIDER_SITE_OTHER): Payer: 59 | Admitting: Psychiatry

## 2019-04-17 ENCOUNTER — Encounter (HOSPITAL_COMMUNITY): Payer: Self-pay | Admitting: Psychiatry

## 2019-04-17 ENCOUNTER — Other Ambulatory Visit: Payer: Self-pay

## 2019-04-17 DIAGNOSIS — F411 Generalized anxiety disorder: Secondary | ICD-10-CM | POA: Diagnosis not present

## 2019-04-17 DIAGNOSIS — F3131 Bipolar disorder, current episode depressed, mild: Secondary | ICD-10-CM

## 2019-04-17 MED ORDER — LAMOTRIGINE 150 MG PO TABS: 300.0000 mg | ORAL_TABLET | Freq: Every day | ORAL | 0 refills | Status: DC

## 2019-04-17 MED ORDER — ARIPIPRAZOLE 5 MG PO TABS: 5.0000 mg | ORAL_TABLET | Freq: Every day | ORAL | 2 refills | Status: DC

## 2019-04-17 MED ORDER — ALPRAZOLAM 0.5 MG PO TABS: 0.5000 mg | ORAL_TABLET | Freq: Every day | ORAL | 0 refills | Status: DC | PRN
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
## 2019-05-01 DIAGNOSIS — Z113 Encounter for screening for infections with a predominantly sexual mode of transmission: Secondary | ICD-10-CM | POA: Diagnosis not present

## 2019-05-01 DIAGNOSIS — Z01419 Encounter for gynecological examination (general) (routine) without abnormal findings: Secondary | ICD-10-CM | POA: Diagnosis not present

## 2019-05-01 DIAGNOSIS — R87610 Atypical squamous cells of undetermined significance on cytologic smear of cervix (ASC-US): Secondary | ICD-10-CM | POA: Diagnosis not present

## 2019-05-01 DIAGNOSIS — Z1151 Encounter for screening for human papillomavirus (HPV): Secondary | ICD-10-CM | POA: Diagnosis not present

## 2019-05-01 DIAGNOSIS — Z30431 Encounter for routine checking of intrauterine contraceptive device: Secondary | ICD-10-CM | POA: Diagnosis not present

## 2019-05-01 DIAGNOSIS — Z1389 Encounter for screening for other disorder: Secondary | ICD-10-CM | POA: Diagnosis not present

## 2019-05-01 DIAGNOSIS — Z6830 Body mass index (BMI) 30.0-30.9, adult: Secondary | ICD-10-CM | POA: Diagnosis not present

## 2019-05-01 DIAGNOSIS — Z124 Encounter for screening for malignant neoplasm of cervix: Secondary | ICD-10-CM | POA: Diagnosis not present

## 2019-05-01 DIAGNOSIS — I10 Essential (primary) hypertension: Secondary | ICD-10-CM | POA: Diagnosis not present

## 2019-05-01 DIAGNOSIS — Z130 Encounter for screening for diseases of the blood and blood-forming organs and certain disorders involving the immune mechanism: Secondary | ICD-10-CM | POA: Diagnosis not present

## 2019-05-01 DIAGNOSIS — E0590 Thyrotoxicosis, unspecified without thyrotoxic crisis or storm: Secondary | ICD-10-CM | POA: Diagnosis not present

## 2019-05-02 DIAGNOSIS — Z113 Encounter for screening for infections with a predominantly sexual mode of transmission: Secondary | ICD-10-CM | POA: Diagnosis not present

## 2019-05-02 DIAGNOSIS — N926 Irregular menstruation, unspecified: Secondary | ICD-10-CM | POA: Diagnosis not present

## 2019-05-10 ENCOUNTER — Other Ambulatory Visit (HOSPITAL_COMMUNITY): Payer: Self-pay

## 2019-05-10 ENCOUNTER — Other Ambulatory Visit (HOSPITAL_COMMUNITY): Payer: Self-pay | Admitting: Psychiatry

## 2019-05-10 DIAGNOSIS — F411 Generalized anxiety disorder: Secondary | ICD-10-CM

## 2019-05-10 DIAGNOSIS — F3131 Bipolar disorder, current episode depressed, mild: Secondary | ICD-10-CM

## 2019-05-10 MED ORDER — ALPRAZOLAM 0.5 MG PO TABS: 0.5000 mg | ORAL_TABLET | Freq: Every day | ORAL | 0 refills | Status: DC | PRN

## 2019-05-14 MED FILL — HYDROCHLOROTHIAZIDE 12.5 MG: 12.5 | 90 days supply | Qty: 90 | Fill #0

## 2019-05-14 MED FILL — LOSARTAN POTASSIUM 50 MG TA: 50 | 30 days supply | Qty: 30 | Fill #1

## 2019-05-15 ENCOUNTER — Other Ambulatory Visit: Payer: Self-pay | Admitting: Family Medicine

## 2019-05-15 MED ORDER — HYDROCHLOROTHIAZIDE 12.5 MG PO TABS: 12.5000 mg | ORAL_TABLET | Freq: Every day | ORAL | 1 refills | Status: DC

## 2019-05-15 MED FILL — SUBVENITE 150 MG TABS: 150 | 90 days supply | Qty: 180 | Fill #0

## 2019-05-20 MED FILL — ARIPIPRAZOLE 5 MG TABS: 5 | 30 days supply | Qty: 30 | Fill #0

## 2019-06-12 ENCOUNTER — Other Ambulatory Visit (HOSPITAL_COMMUNITY): Payer: Self-pay

## 2019-06-12 DIAGNOSIS — F411 Generalized anxiety disorder: Secondary | ICD-10-CM

## 2019-06-12 MED ORDER — ALPRAZOLAM 0.5 MG PO TABS: 0.5000 mg | ORAL_TABLET | Freq: Every day | ORAL | 0 refills | Status: DC | PRN

## 2019-06-13 MED FILL — ARIPiprazole 5 MG TABS: 5 | 30 days supply | Qty: 30 | Fill #0

## 2019-06-21 MED FILL — LOSARTAN POTASSIUM 50 MG TA: 50 | 30 days supply | Qty: 30 | Fill #0

## 2019-07-15 ENCOUNTER — Ambulatory Visit (INDEPENDENT_AMBULATORY_CARE_PROVIDER_SITE_OTHER): Payer: 59 | Admitting: Psychiatry

## 2019-07-15 ENCOUNTER — Encounter (HOSPITAL_COMMUNITY): Payer: Self-pay | Admitting: Psychiatry

## 2019-07-15 ENCOUNTER — Other Ambulatory Visit: Payer: Self-pay

## 2019-07-15 DIAGNOSIS — F411 Generalized anxiety disorder: Secondary | ICD-10-CM

## 2019-07-15 DIAGNOSIS — F3131 Bipolar disorder, current episode depressed, mild: Secondary | ICD-10-CM

## 2019-07-15 MED ORDER — ARIPIPRAZOLE 5 MG PO TABS: 5.0000 mg | ORAL_TABLET | Freq: Every day | ORAL | 0 refills | Status: DC

## 2019-07-15 MED ORDER — ALPRAZOLAM 0.5 MG PO TABS: 0.5000 mg | ORAL_TABLET | Freq: Every day | ORAL | 2 refills | Status: DC | PRN

## 2019-07-15 MED ORDER — LAMOTRIGINE 150 MG PO TABS: 300.0000 mg | ORAL_TABLET | Freq: Every day | ORAL | 0 refills | Status: DC

## 2019-07-15 MED FILL — ARIPIPRAZOLE 5 MG TABS: 5 | 90 days supply | Qty: 90 | Fill #0
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
## 2019-07-26 MED FILL — LOSARTAN POTASSIUM 50 MG TA: 50 | 30 days supply | Qty: 30 | Fill #1

## 2019-07-26 MED FILL — ARIPiprazole 5 MG TABS: 5 | 90 days supply | Qty: 90 | Fill #0

## 2019-08-08 MED FILL — ARIPiprazole 5 MG TABS: 5 | 90 days supply | Qty: 90 | Fill #0

## 2019-08-08 MED FILL — LOSARTAN POTASSIUM 50 MG TA: 50 | 30 days supply | Qty: 30 | Fill #1

## 2019-09-05 ENCOUNTER — Other Ambulatory Visit: Payer: Self-pay | Admitting: Family Medicine

## 2019-09-05 MED FILL — lamoTRIgine 150 MG TABS: 150 | 90 days supply | Qty: 180 | Fill #0

## 2019-09-05 NOTE — Telephone Encounter (Signed)
Ok to refill??  Last office visit 02/25/2019.  Medication is no longer on list.

## 2019-10-14 MED FILL — HYDROCHLOROTHIAZIDE 12.5 MG: 12.5 | 90 days supply | Qty: 90 | Fill #0

## 2019-10-14 MED FILL — LOSARTAN POTASSIUM 50 MG TA: 50 | 30 days supply | Qty: 30 | Fill #2

## 2019-10-15 ENCOUNTER — Ambulatory Visit (INDEPENDENT_AMBULATORY_CARE_PROVIDER_SITE_OTHER): Payer: 59 | Admitting: Psychiatry

## 2019-10-15 ENCOUNTER — Other Ambulatory Visit: Payer: Self-pay

## 2019-10-15 ENCOUNTER — Encounter (HOSPITAL_COMMUNITY): Payer: Self-pay | Admitting: Psychiatry

## 2019-10-15 DIAGNOSIS — F411 Generalized anxiety disorder: Secondary | ICD-10-CM | POA: Diagnosis not present

## 2019-10-15 DIAGNOSIS — F3131 Bipolar disorder, current episode depressed, mild: Secondary | ICD-10-CM | POA: Diagnosis not present

## 2019-10-15 MED ORDER — LAMOTRIGINE 150 MG PO TABS: 300.0000 mg | ORAL_TABLET | Freq: Every day | ORAL | 0 refills | Status: DC

## 2019-10-15 MED ORDER — ARIPIPRAZOLE 5 MG PO TABS: 5.0000 mg | ORAL_TABLET | Freq: Every day | ORAL | 0 refills | Status: DC
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
## 2019-11-26 ENCOUNTER — Other Ambulatory Visit (HOSPITAL_COMMUNITY): Payer: Self-pay | Admitting: Psychiatry

## 2019-11-26 DIAGNOSIS — F411 Generalized anxiety disorder: Secondary | ICD-10-CM

## 2019-11-28 ENCOUNTER — Telehealth (HOSPITAL_COMMUNITY): Payer: Self-pay | Admitting: *Deleted

## 2019-11-28 ENCOUNTER — Other Ambulatory Visit (HOSPITAL_COMMUNITY): Payer: Self-pay | Admitting: *Deleted

## 2019-11-28 DIAGNOSIS — F411 Generalized anxiety disorder: Secondary | ICD-10-CM

## 2019-11-28 NOTE — Telephone Encounter (Signed)
Pt called requesting refill of the Xanax 0.5mg . last started on 07/15/19. Pt has an upcoming appointment on 01/03/20. Last rx written for #45 with 2 refills. Can I reorder? Or do you want to do #60 (or #45) with no refills to get her through to her appt? Please advise.

## 2019-11-29 MED ORDER — ALPRAZOLAM 0.5 MG PO TABS: 0.5000 mg | ORAL_TABLET | Freq: Every day | ORAL | 0 refills | Status: DC | PRN

## 2019-11-29 NOTE — Telephone Encounter (Signed)
Send to [facility] one month supply.

## 2020-01-13 ENCOUNTER — Encounter (HOSPITAL_COMMUNITY): Payer: Self-pay | Admitting: Psychiatry

## 2020-01-13 ENCOUNTER — Other Ambulatory Visit: Payer: Self-pay

## 2020-01-13 ENCOUNTER — Telehealth (INDEPENDENT_AMBULATORY_CARE_PROVIDER_SITE_OTHER): Payer: 59 | Admitting: Psychiatry

## 2020-01-13 DIAGNOSIS — F411 Generalized anxiety disorder: Secondary | ICD-10-CM | POA: Diagnosis not present

## 2020-01-13 DIAGNOSIS — F3131 Bipolar disorder, current episode depressed, mild: Secondary | ICD-10-CM | POA: Diagnosis not present

## 2020-01-13 MED ORDER — ALPRAZOLAM 0.5 MG PO TABS: 0.5000 mg | ORAL_TABLET | Freq: Every day | ORAL | 1 refills | Status: DC | PRN

## 2020-01-13 MED ORDER — LAMOTRIGINE 150 MG PO TABS: 300.0000 mg | ORAL_TABLET | Freq: Every day | ORAL | 0 refills | Status: DC

## 2020-01-13 MED FILL — lamoTRIgine 150 MG TABS: 150 | 90 days supply | Qty: 180 | Fill #0
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
## 2020-02-04 ENCOUNTER — Other Ambulatory Visit: Payer: Self-pay | Admitting: Family Medicine

## 2020-02-04 MED FILL — LOSARTAN POTASSIUM 50 MG TA: 50 | 90 days supply | Qty: 90 | Fill #0

## 2020-03-20 ENCOUNTER — Telehealth (HOSPITAL_COMMUNITY): Payer: Self-pay | Admitting: *Deleted

## 2020-03-20 ENCOUNTER — Other Ambulatory Visit (HOSPITAL_COMMUNITY): Payer: Self-pay | Admitting: *Deleted

## 2020-03-20 DIAGNOSIS — F411 Generalized anxiety disorder: Secondary | ICD-10-CM

## 2020-03-20 MED ORDER — ALPRAZOLAM 0.5 MG PO TABS: 0.5000 mg | ORAL_TABLET | Freq: Every day | ORAL | 0 refills | Status: DC | PRN

## 2020-03-20 NOTE — Telephone Encounter (Signed)
Pt is calling to refill Alprazolam 0.5mg  last ordered on 01/13/20 with one refill. Pt has an appointment on 7/28. Please review.

## 2020-03-20 NOTE — Telephone Encounter (Signed)
Writer spoke with pt regarding refill. Pt is experiencing increased anxiety and panic attacks and does not feel that she can wait until next appointment for refill writer explained that clinic would refill enough medication to until her next visit, per office protocol, and provider last note, and stressed the importance of keeping f/u appointment. Pt verbalizes understanding.

## 2020-03-24 NOTE — Telephone Encounter (Signed)
Thanks. Yes please refill her meds until next appointment.

## 2020-04-15 ENCOUNTER — Other Ambulatory Visit: Payer: Self-pay

## 2020-04-15 ENCOUNTER — Telehealth (INDEPENDENT_AMBULATORY_CARE_PROVIDER_SITE_OTHER): Payer: 59 | Admitting: Psychiatry

## 2020-04-15 ENCOUNTER — Encounter (HOSPITAL_COMMUNITY): Payer: Self-pay | Admitting: Psychiatry

## 2020-04-15 DIAGNOSIS — F411 Generalized anxiety disorder: Secondary | ICD-10-CM | POA: Diagnosis not present

## 2020-04-15 DIAGNOSIS — F3131 Bipolar disorder, current episode depressed, mild: Secondary | ICD-10-CM | POA: Diagnosis not present

## 2020-04-15 MED ORDER — LAMOTRIGINE 150 MG PO TABS: 300.0000 mg | ORAL_TABLET | Freq: Every day | ORAL | 0 refills | Status: DC

## 2020-04-15 MED ORDER — ALPRAZOLAM 0.5 MG PO TABS: 0.5000 mg | ORAL_TABLET | Freq: Every day | ORAL | 1 refills | Status: DC | PRN

## 2020-04-15 MED ORDER — ARIPIPRAZOLE 2 MG PO TABS: 2.0000 mg | ORAL_TABLET | Freq: Every day | ORAL | 0 refills | Status: DC

## 2020-04-15 MED FILL — ARIPiprazole 2 MG TABS: 2 | 90 days supply | Qty: 90 | Fill #0

## 2020-04-15 MED FILL — lamoTRIgine 150 MG TABS: 150 | 90 days supply | Qty: 180 | Fill #0

## 2020-04-15 MED FILL — ALPRAZolam 0.5 MG TABS: 0.5 | 30 days supply | Qty: 30 | Fill #0
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
## 2020-05-15 MED FILL — ALPRAZolam 0.5 MG TABS: 0.5 | 30 days supply | Qty: 30 | Fill #1

## 2020-06-16 DIAGNOSIS — R319 Hematuria, unspecified: Secondary | ICD-10-CM | POA: Diagnosis not present

## 2020-06-16 DIAGNOSIS — Z113 Encounter for screening for infections with a predominantly sexual mode of transmission: Secondary | ICD-10-CM | POA: Diagnosis not present

## 2020-06-16 DIAGNOSIS — Z7189 Other specified counseling: Secondary | ICD-10-CM | POA: Diagnosis not present

## 2020-06-16 DIAGNOSIS — I10 Essential (primary) hypertension: Secondary | ICD-10-CM | POA: Diagnosis not present

## 2020-06-16 DIAGNOSIS — Z130 Encounter for screening for diseases of the blood and blood-forming organs and certain disorders involving the immune mechanism: Secondary | ICD-10-CM | POA: Diagnosis not present

## 2020-06-16 DIAGNOSIS — Z1231 Encounter for screening mammogram for malignant neoplasm of breast: Secondary | ICD-10-CM | POA: Diagnosis not present

## 2020-06-16 DIAGNOSIS — Z803 Family history of malignant neoplasm of breast: Secondary | ICD-10-CM | POA: Insufficient documentation

## 2020-06-16 DIAGNOSIS — Z6828 Body mass index (BMI) 28.0-28.9, adult: Secondary | ICD-10-CM | POA: Diagnosis not present

## 2020-06-16 DIAGNOSIS — Z1389 Encounter for screening for other disorder: Secondary | ICD-10-CM | POA: Diagnosis not present

## 2020-06-16 DIAGNOSIS — R635 Abnormal weight gain: Secondary | ICD-10-CM | POA: Diagnosis not present

## 2020-06-16 DIAGNOSIS — Z01419 Encounter for gynecological examination (general) (routine) without abnormal findings: Secondary | ICD-10-CM | POA: Diagnosis not present

## 2020-07-16 ENCOUNTER — Other Ambulatory Visit: Payer: Self-pay

## 2020-07-16 ENCOUNTER — Other Ambulatory Visit (HOSPITAL_COMMUNITY): Payer: Self-pay | Admitting: Psychiatry

## 2020-07-16 ENCOUNTER — Encounter (HOSPITAL_COMMUNITY): Payer: Self-pay | Admitting: Psychiatry

## 2020-07-16 ENCOUNTER — Telehealth (INDEPENDENT_AMBULATORY_CARE_PROVIDER_SITE_OTHER): Payer: 59 | Admitting: Psychiatry

## 2020-07-16 VITALS — Wt 184.0 lb

## 2020-07-16 DIAGNOSIS — F419 Anxiety disorder, unspecified: Secondary | ICD-10-CM | POA: Diagnosis not present

## 2020-07-16 DIAGNOSIS — F319 Bipolar disorder, unspecified: Secondary | ICD-10-CM | POA: Diagnosis not present

## 2020-07-16 MED ORDER — ARIPIPRAZOLE 2 MG PO TABS: 2.0000 mg | ORAL_TABLET | Freq: Every day | ORAL | 0 refills | Status: DC

## 2020-07-16 MED ORDER — ALPRAZOLAM 0.5 MG PO TABS: 0.5000 mg | ORAL_TABLET | Freq: Every day | ORAL | 1 refills | Status: DC | PRN

## 2020-07-16 MED ORDER — LAMOTRIGINE 150 MG PO TABS: 300.0000 mg | ORAL_TABLET | Freq: Every day | ORAL | 0 refills | Status: DC

## 2020-07-16 MED FILL — ALPRAZolam 0.5 MG TABS: 0.5 | 30 days supply | Qty: 30 | Fill #0

## 2020-07-16 MED FILL — lamoTRIgine 150 MG TABS: 150 | 90 days supply | Qty: 180 | Fill #0

## 2020-07-16 MED FILL — ARIPiprazole 2 MG TABS: 2 | 90 days supply | Qty: 90 | Fill #0
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
## 2020-07-24 MED FILL — ALPRAZolam 0.5 MG TABS: 0.5 | 30 days supply | Qty: 30 | Fill #0

## 2020-08-12 DIAGNOSIS — Z113 Encounter for screening for infections with a predominantly sexual mode of transmission: Secondary | ICD-10-CM | POA: Diagnosis not present

## 2020-09-01 ENCOUNTER — Telehealth (HOSPITAL_COMMUNITY): Payer: Self-pay | Admitting: *Deleted

## 2020-09-01 MED FILL — LOSARTAN POTASSIUM 50 MG TA: 50 | 90 days supply | Qty: 90 | Fill #1

## 2020-09-01 MED FILL — ALPRAZolam 0.5 MG TABS: 0.5 | 30 days supply | Qty: 30 | Fill #1

## 2020-09-01 NOTE — Telephone Encounter (Signed)
Pt called requesting refill of the Xanax 0.5mg . pt has an upcoming appointment on 10/15/20.

## 2020-09-01 NOTE — Telephone Encounter (Signed)
She was given 30 tablets on October 28 with 1 refill.  She should have an that last until her next appointment since patient taking the Xanax only as needed.

## 2020-09-02 NOTE — Telephone Encounter (Signed)
Yes it is too early but she will run out before next appointment. Wait until it's due? Or call one in to start 09/15/20?

## 2020-09-03 ENCOUNTER — Other Ambulatory Visit (HOSPITAL_COMMUNITY): Payer: Self-pay | Admitting: *Deleted

## 2020-09-03 DIAGNOSIS — F419 Anxiety disorder, unspecified: Secondary | ICD-10-CM

## 2020-09-03 MED ORDER — ALPRAZOLAM 0.5 MG PO TABS: 0.5000 mg | ORAL_TABLET | Freq: Every day | ORAL | 1 refills | Status: DC | PRN

## 2020-09-03 NOTE — Telephone Encounter (Signed)
Yes call the prescription to start on December 28.

## 2020-09-28 DIAGNOSIS — Z20822 Contact with and (suspected) exposure to covid-19: Secondary | ICD-10-CM | POA: Diagnosis not present

## 2020-10-15 ENCOUNTER — Other Ambulatory Visit (HOSPITAL_COMMUNITY): Payer: Self-pay | Admitting: Psychiatry

## 2020-10-15 ENCOUNTER — Encounter (HOSPITAL_COMMUNITY): Payer: Self-pay | Admitting: Psychiatry

## 2020-10-15 ENCOUNTER — Other Ambulatory Visit: Payer: Self-pay

## 2020-10-15 ENCOUNTER — Telehealth (INDEPENDENT_AMBULATORY_CARE_PROVIDER_SITE_OTHER): Payer: 59 | Admitting: Psychiatry

## 2020-10-15 DIAGNOSIS — F319 Bipolar disorder, unspecified: Secondary | ICD-10-CM | POA: Diagnosis not present

## 2020-10-15 DIAGNOSIS — F419 Anxiety disorder, unspecified: Secondary | ICD-10-CM | POA: Diagnosis not present

## 2020-10-15 MED ORDER — ARIPIPRAZOLE 2 MG PO TABS: 2.0000 mg | ORAL_TABLET | Freq: Every day | ORAL | 0 refills | Status: DC

## 2020-10-15 MED ORDER — LAMOTRIGINE 150 MG PO TABS: 300.0000 mg | ORAL_TABLET | Freq: Every day | ORAL | 0 refills | Status: DC

## 2020-10-15 MED FILL — lamoTRIgine 150 MG TABS: 150 | 90 days supply | Qty: 180 | Fill #0

## 2020-10-15 MED FILL — ARIPiprazole 2 MG TABS: 2 | 90 days supply | Qty: 90 | Fill #0
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
## 2020-10-21 ENCOUNTER — Other Ambulatory Visit (HOSPITAL_COMMUNITY): Payer: Self-pay | Admitting: Psychiatry

## 2020-10-21 DIAGNOSIS — F419 Anxiety disorder, unspecified: Secondary | ICD-10-CM

## 2020-10-26 ENCOUNTER — Other Ambulatory Visit (HOSPITAL_COMMUNITY): Payer: Self-pay | Admitting: Psychiatry

## 2020-10-26 DIAGNOSIS — F419 Anxiety disorder, unspecified: Secondary | ICD-10-CM

## 2020-10-26 MED FILL — lamoTRIgine 150 MG TABS: 150 | 90 days supply | Qty: 180 | Fill #0

## 2020-10-26 MED FILL — ARIPiprazole 2 MG TABS: 2 | 90 days supply | Qty: 90 | Fill #0

## 2021-01-13 ENCOUNTER — Other Ambulatory Visit (HOSPITAL_COMMUNITY): Payer: Self-pay

## 2021-01-13 ENCOUNTER — Telehealth (INDEPENDENT_AMBULATORY_CARE_PROVIDER_SITE_OTHER): Payer: 59 | Admitting: Psychiatry

## 2021-01-13 ENCOUNTER — Other Ambulatory Visit: Payer: Self-pay

## 2021-01-13 ENCOUNTER — Encounter (HOSPITAL_COMMUNITY): Payer: Self-pay | Admitting: Psychiatry

## 2021-01-13 DIAGNOSIS — F319 Bipolar disorder, unspecified: Secondary | ICD-10-CM | POA: Diagnosis not present

## 2021-01-13 DIAGNOSIS — F419 Anxiety disorder, unspecified: Secondary | ICD-10-CM | POA: Diagnosis not present

## 2021-01-13 MED ORDER — ARIPIPRAZOLE 2 MG PO TABS
2.0000 mg | ORAL_TABLET | Freq: Every day | ORAL | 0 refills | Status: DC
  Filled 2021-01-13: qty 90, 90d supply, fill #0

## 2021-01-13 MED ORDER — ALPRAZOLAM 0.5 MG PO TABS
0.5000 mg | ORAL_TABLET | Freq: Every day | ORAL | 0 refills | Status: DC | PRN
Start: 2021-01-13 — End: 2021-04-01

## 2021-01-13 MED ORDER — LAMOTRIGINE 150 MG PO TABS
300.0000 mg | ORAL_TABLET | Freq: Every day | ORAL | 0 refills | Status: DC
  Filled 2021-01-13: qty 180, 90d supply, fill #0
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
## 2021-01-20 ENCOUNTER — Ambulatory Visit: Payer: 59 | Admitting: Nurse Practitioner

## 2021-01-20 ENCOUNTER — Other Ambulatory Visit: Payer: Self-pay

## 2021-01-20 ENCOUNTER — Encounter: Payer: Self-pay | Admitting: Nurse Practitioner

## 2021-01-20 VITALS — BP 170/120 | HR 84 | Temp 98.4°F | Ht 67.0 in | Wt 172.4 lb

## 2021-01-20 DIAGNOSIS — Z13220 Encounter for screening for lipoid disorders: Secondary | ICD-10-CM

## 2021-01-20 DIAGNOSIS — Z136 Encounter for screening for cardiovascular disorders: Secondary | ICD-10-CM | POA: Diagnosis not present

## 2021-01-20 DIAGNOSIS — Z1329 Encounter for screening for other suspected endocrine disorder: Secondary | ICD-10-CM

## 2021-01-20 DIAGNOSIS — Z6827 Body mass index (BMI) 27.0-27.9, adult: Secondary | ICD-10-CM

## 2021-01-20 DIAGNOSIS — Z131 Encounter for screening for diabetes mellitus: Secondary | ICD-10-CM

## 2021-01-20 DIAGNOSIS — I10 Essential (primary) hypertension: Secondary | ICD-10-CM | POA: Diagnosis not present

## 2021-01-20 DIAGNOSIS — Z1159 Encounter for screening for other viral diseases: Secondary | ICD-10-CM | POA: Diagnosis not present

## 2021-01-20 DIAGNOSIS — Z114 Encounter for screening for human immunodeficiency virus [HIV]: Secondary | ICD-10-CM | POA: Diagnosis not present

## 2021-01-20 MED ORDER — OLMESARTAN MEDOXOMIL 20 MG PO TABS: 20.0000 mg | ORAL_TABLET | Freq: Every day | ORAL | 0 refills | Status: DC

## 2021-01-20 NOTE — Progress Notes (Signed)
Subjective:    Patient ID: Amanda Strong, female    DOB: 05/28/1981, 40 y.o.   MRN: [id number]  HPI: Amanda Strong is a 40 y.o. female presenting for blood pressure follow up.  Chief Complaint  Patient presents with  . Hypertension    Has not taking medication for 2 wks, has been having headaches. Has lost weight to aid in bp, bp last reading 167/119. Work does not want her to return due to the headaches and high bp readings. Works in Teacher, music. Told she was pre dm in the past   HYPERTENSION Patient reports she has been taking losartan 50 and HCTZ 12.5 inconsistently; she previously took herself off of the medications due to weight loss and healthy lifestyle changes.    Over the past few days, she has been having worsening headaches.  She recently started a new role at work and this is stressful.  She checks her blood pressure at home.  Yesterday at work, was sent home due to elevated blood pressure.  Hypertension status: uncontrolled  Satisfied with current treatment? no Duration of hypertension: chronic BP monitoring frequency:  daily BP range: 167/119; 167/103 at work BP medication side effects:  no Medication compliance: fair Aspirin: no Recurrent headaches: yes Visual changes: no Palpitations: no Dyspnea: no Chest pain: no Lower extremity edema: no Dizzy/lightheaded: no  Allergies  Allergen Reactions  . Cephalexin     REACTION: intense itching all over  . Percocet [Oxycodone-Acetaminophen] Itching    Outpatient Encounter Medications as of 01/20/2021  Medication Sig  . ALPRAZolam (XANAX) 0.5 MG tablet Take 1 tablet (0.5 mg total) by mouth daily as needed for anxiety.  . ARIPiprazole (ABILIFY) 2 MG tablet Take 1 tablet (2 mg total) by mouth daily.  . hydrochlorothiazide (HYDRODIURIL) 12.5 MG tablet Take 1 tablet (12.5 mg total) by mouth daily.  Marland Kitchen lamoTRIgine (LAMICTAL) 150 MG tablet Take 2 tablets (300 mg total) by mouth daily.  Marland Kitchen levocetirizine (XYZAL) 5  MG tablet Take 5 mg by mouth daily.  Marland Kitchen levonorgestrel (MIRENA) 20 MCG/24HR IUD Mirena 20 mcg/24 hours (5 yrs) 52 mg intrauterine device  . montelukast (SINGULAIR) 10 MG tablet Take 10 mg by mouth at bedtime.  Marland Kitchen olmesartan (BENICAR) 20 MG tablet Take 1 tablet (20 mg total) by mouth daily.  . [DISCONTINUED] losartan (COZAAR) 50 MG tablet TAKE 1 TABLET BY MOUTH ONCE DAILY WITH HCTZ  . [DISCONTINUED] desvenlafaxine (PRISTIQ) 50 MG 24 hr tablet Take 1 tablet (50 mg total) by mouth daily.  . [DISCONTINUED] olmesartan-hydrochlorothiazide (BENICAR HCT) 20-12.5 MG per tablet Take 1 tablet by mouth daily.     No facility-administered encounter medications on file as of 01/20/2021.    Patient Active Problem List   Diagnosis Date Noted  . Family history of breast cancer 06/16/2020  . Hyperthyroidism 02/28/2018  . Gestational diabetes mellitus 02/28/2018  . LGSIL (low grade squamous intraepithelial dysplasia)   . SVD (spontaneous vaginal delivery) 10/29/2012  . Normal pregnancy, repeat 10/28/2012  . Group B streptococcal infection in pregnancy 10/28/2012  . Abnormal TSH 01/23/2012  . FLATULENCE ERUCTATION AND GAS PAIN 06/30/2010  . COSTOCHONDRITIS 10/21/2009  . WEIGHT GAIN 10/14/2009  . Major depression, chronic 05/08/2009  . CONSTIPATION, MILD 06/27/2008  . VAGINITIS, BACTERIAL 06/27/2008  . GAD (generalized anxiety disorder) 04/04/2008  . ALLERGIC RHINITIS 08/31/2007  . Essential hypertension 04/27/2007  . ACNE NEC 04/27/2007    Past Medical History:  Diagnosis Date  . Abnormal Pap smear   . Acne   .  Allergy   . Anxiety   . Depression   . Group B streptococcal infection in pregnancy 10/28/2012  . History of chicken pox   . Hypertension   . Hyperthyroidism   . LGSIL (low grade squamous intraepithelial dysplasia)    Dr. Senaida Ores  . Normal pregnancy, repeat 10/28/2012    Relevant past medical, surgical, family and social history reviewed and updated as indicated. Interim medical  history since our last visit reviewed.  Review of Systems Per HPI unless specifically indicated above     Objective:    BP (!) 170/120   Pulse 84   Temp 98.4 F (36.9 C)   Ht 5\' 7"  (1.702 m)   Wt 172 lb 6.4 oz (78.2 kg)   SpO2 99%   BMI 27.00 kg/m   Wt Readings from Last 3 Encounters:  01/20/21 172 lb 6.4 oz (78.2 kg)  02/25/19 190 lb (86.2 kg)  06/02/17 154 lb (69.9 kg)    LMP: Mirena; does not have cycles  Physical Exam Vitals and nursing note reviewed.  Constitutional:      General: [name] not in acute distress.    Appearance: Normal appearance. [name] not toxic-appearing.  HENT:     Head: Normocephalic and atraumatic.     Right Ear: External ear normal.     Left Ear: External ear normal.     Nose: Nose normal. No congestion.     Mouth/Throat:     Mouth: Mucous membranes are moist.     Pharynx: Oropharynx is clear.  Eyes:     General: No scleral icterus.       Right eye: No discharge.        Left eye: No discharge.     Extraocular Movements: Extraocular movements intact.     Pupils: Pupils are equal, round, and reactive to light.  Cardiovascular:     Rate and Rhythm: Normal rate and regular rhythm.     Heart sounds: Normal heart sounds. No murmur heard.   Pulmonary:     Effort: Pulmonary effort is normal. No respiratory distress.     Breath sounds: Normal breath sounds. No wheezing, rhonchi or rales.  Abdominal:     General: Abdomen is flat. Bowel sounds are normal.     Palpations: Abdomen is soft.  Musculoskeletal:        General: Normal range of motion.     Cervical back: Normal range of motion.     Right lower leg: No edema.     Left lower leg: No edema.  Lymphadenopathy:     Cervical: No cervical adenopathy.  Skin:    General: Skin is warm and dry.     Capillary Refill: Capillary refill takes less than 2 seconds.     Coloration: Skin is not jaundiced or pale.     Findings: No erythema.  Neurological:     General: No focal deficit present.      Mental Status: [name] alert and oriented to person, place, and time.     Sensory: Sensation is intact.     Motor: Motor function is intact. No weakness.     Coordination: Coordination is intact. Coordination normal.     Gait: Gait is intact. Gait normal.  Psychiatric:        Mood and Affect: Mood normal.        Behavior: Behavior normal.        Thought Content: Thought content normal.        Judgment: Judgment normal.  Assessment & Plan:   Problem List Items Addressed This Visit      Cardiovascular and Mediastinum   Essential hypertension - Primary    Uncontrolled.  We will switch losartan to olmesartan 10 mg daily for longer acting effects.  Continue hydrochlorothiazide 12.5 mg daily.  Advised patient to continue checking blood pressure at home-goal is less than 130/80.  We will check kidney function today with electrolytes.  With any vision changes, dizziness/lightheadedness, chest pain or shortness of breath associated with the high blood pressure, go to the ER.  Follow-up in 4 weeks.      Relevant Medications   olmesartan (BENICAR) 20 MG tablet   Other Relevant Orders   COMPLETE METABOLIC PANEL WITH GFR   Lipid panel   TSH   CBC with Differential/Platelet    Other Visit Diagnoses    Screening for diabetes mellitus       Relevant Orders   Hemoglobin A1c   Need for hepatitis C screening test       Relevant Orders   Hepatitis C antibody   Screening for HIV (human immunodeficiency virus)       Relevant Orders   HIV Antibody (routine testing w rflx)   BMI 27.0-27.9,adult       Relevant Orders   COMPLETE METABOLIC PANEL WITH GFR   Lipid panel   TSH   CBC with Differential/Platelet   Hemoglobin A1c   Encounter for lipid screening for cardiovascular disease       Relevant Orders   Lipid panel   Screening for thyroid disorder       Relevant Orders   TSH       Follow up plan: Return in about 4 weeks (around 02/17/2021) for BP f/u .

## 2021-01-20 NOTE — Assessment & Plan Note (Addendum)
Uncontrolled.  We will switch losartan to olmesartan 10 mg daily for longer acting effects.  Continue hydrochlorothiazide 12.5 mg daily.  Advised patient to continue checking blood pressure at home-goal is less than 130/80.  We will check kidney function today with electrolytes.  With any vision changes, dizziness/lightheadedness, chest pain or shortness of breath associated with the high blood pressure, go to the ER.  Follow-up in 4 weeks.

## 2021-01-21 ENCOUNTER — Other Ambulatory Visit (HOSPITAL_COMMUNITY): Payer: Self-pay

## 2021-01-21 ENCOUNTER — Ambulatory Visit: Payer: 59 | Admitting: Nurse Practitioner

## 2021-01-21 ENCOUNTER — Encounter: Payer: Self-pay | Admitting: Nurse Practitioner

## 2021-01-21 LAB — CBC WITH DIFFERENTIAL/PLATELET
Absolute Monocytes: 931 cells/uL (ref 200–950)
Basophils Absolute: 54 cells/uL (ref 0–200)
Basophils Relative: 0.5 %
Eosinophils Absolute: 75 cells/uL (ref 15–500)
Eosinophils Relative: 0.7 %
HCT: 40.6 % (ref 35.0–45.0)
Hemoglobin: 13.3 g/dL (ref 11.7–15.5)
Lymphs Abs: 1980 cells/uL (ref 850–3900)
MCH: 29.8 pg (ref 27.0–33.0)
MCHC: 32.8 g/dL (ref 32.0–36.0)
MCV: 91 fL (ref 80.0–100.0)
MPV: 11.9 fL (ref 7.5–12.5)
Monocytes Relative: 8.7 %
Neutro Abs: 7661 cells/uL (ref 1500–7800)
Neutrophils Relative %: 71.6 %
Platelets: 227 10*3/uL (ref 140–400)
RBC: 4.46 10*6/uL (ref 3.80–5.10)
RDW: 12 % (ref 11.0–15.0)
Total Lymphocyte: 18.5 %
WBC: 10.7 10*3/uL (ref 3.8–10.8)

## 2021-01-21 LAB — HIV ANTIBODY (ROUTINE TESTING W REFLEX): HIV 1&2 Ab, 4th Generation: NONREACTIVE

## 2021-01-21 LAB — COMPLETE METABOLIC PANEL WITH GFR
AG Ratio: 1.9 (calc) (ref 1.0–2.5)
ALT: 11 U/L (ref 6–29)
AST: 13 U/L (ref 10–30)
Albumin: 4.5 g/dL (ref 3.6–5.1)
Alkaline phosphatase (APISO): 63 U/L (ref 31–125)
BUN: 13 mg/dL (ref 7–25)
CO2: 25 mmol/L (ref 20–32)
Calcium: 9.4 mg/dL (ref 8.6–10.2)
Chloride: 104 mmol/L (ref 98–110)
Creat: 0.93 mg/dL (ref 0.50–1.10)
GFR, Est African American: 90 mL/min/{1.73_m2} (ref >=60)
GFR, Est Non African American: 77 mL/min/{1.73_m2} (ref >=60)
Globulin: 2.4 g/dL (calc) (ref 1.9–3.7)
Glucose, Bld: 86 mg/dL (ref 65–99)
Potassium: 4.3 mmol/L (ref 3.5–5.3)
Sodium: 139 mmol/L (ref 135–146)
Total Bilirubin: 0.3 mg/dL (ref 0.2–1.2)
Total Protein: 6.9 g/dL (ref 6.1–8.1)

## 2021-01-21 LAB — HEMOGLOBIN A1C
Hgb A1c MFr Bld: 5.1 % of total Hgb (ref <5.7)
Mean Plasma Glucose: 100 mg/dL
eAG (mmol/L): 5.5 mmol/L

## 2021-01-21 LAB — LIPID PANEL
Cholesterol: 130 mg/dL (ref <200)
HDL: 47 mg/dL — ABNORMAL LOW (ref >=50)
LDL Cholesterol (Calc): 71 mg/dL (calc)
Non-HDL Cholesterol (Calc): 83 mg/dL (calc) (ref <130)
Total CHOL/HDL Ratio: 2.8 (calc) (ref <5.0)
Triglycerides: 42 mg/dL (ref <150)

## 2021-01-21 LAB — HEPATITIS C ANTIBODY
Hepatitis C Ab: NONREACTIVE
SIGNAL TO CUT-OFF: 0.01 (ref <1.00)

## 2021-01-21 LAB — TSH: TSH: 0.79 mIU/L

## 2021-02-04 ENCOUNTER — Other Ambulatory Visit: Payer: Self-pay | Admitting: Nurse Practitioner

## 2021-02-04 ENCOUNTER — Encounter: Payer: Self-pay | Admitting: Nurse Practitioner

## 2021-02-04 MED ORDER — HYDROCHLOROTHIAZIDE 12.5 MG PO TABS: 12.5000 mg | ORAL_TABLET | Freq: Every day | ORAL | 1 refills | Status: DC

## 2021-02-17 ENCOUNTER — Ambulatory Visit: Payer: 59 | Admitting: Nurse Practitioner

## 2021-02-19 ENCOUNTER — Other Ambulatory Visit: Payer: Self-pay

## 2021-02-19 DIAGNOSIS — I10 Essential (primary) hypertension: Secondary | ICD-10-CM

## 2021-02-19 MED ORDER — OLMESARTAN MEDOXOMIL 20 MG PO TABS: 20.0000 mg | ORAL_TABLET | Freq: Every day | ORAL | 0 refills | Status: DC

## 2021-02-21 NOTE — Progress Notes (Deleted)
Subjective:    Patient ID: Amanda Strong, female    DOB: [birth year], 40 y.o.   MRN: [id number]  HPI: Amanda Strong is a 39 y.o. female presenting for  No chief complaint on file.  HYPERTENSION Hypertension status: {Blank single:19197::"controlled","uncontrolled","better","worse","exacerbated","stable"}  Satisfied with current treatment? {Blank single:19197::"yes","no"} Duration of hypertension: {Blank single:19197::"chronic","months","years"} BP monitoring frequency:  {Blank single:19197::"not checking","rarely","daily","weekly","monthly","a few times a day","a few times a week","a few times a month"} BP range:  BP medication side effects:  {Blank single:19197::"yes","no"} Medication compliance: Aspirin: {Blank single:19197::"yes","no"} Recurrent headaches: {Blank single:19197::"yes","no"} Visual changes: {Blank single:19197::"yes","no"} Palpitations: {Blank single:19197::"yes","no"} Dyspnea: {Blank single:19197::"yes","no"} Chest pain: {Blank single:19197::"yes","no"} Lower extremity edema: {Blank single:19197::"yes","no"} Dizzy/lightheaded: {Blank single:19197::"yes","no"}   Allergies  Allergen Reactions  . Cephalexin     REACTION: intense itching all over  . Percocet [Oxycodone-Acetaminophen] Itching    Outpatient Encounter Medications as of 02/22/2021  Medication Sig  . ALPRAZolam (XANAX) 0.5 MG tablet Take 1 tablet (0.5 mg total) by mouth daily as needed for anxiety.  . ARIPiprazole (ABILIFY) 2 MG tablet Take 1 tablet (2 mg total) by mouth daily.  . hydrochlorothiazide (HYDRODIURIL) 12.5 MG tablet Take 1 tablet (12.5 mg total) by mouth daily.  Marland Kitchen lamoTRIgine (LAMICTAL) 150 MG tablet Take 2 tablets (300 mg total) by mouth daily.  Marland Kitchen levocetirizine (XYZAL) 5 MG tablet Take 5 mg by mouth daily.  Marland Kitchen levonorgestrel (MIRENA) 20 MCG/24HR IUD Mirena 20 mcg/24 hours (5 yrs) 52 mg intrauterine device  . montelukast (SINGULAIR) 10 MG tablet Take 10 mg by mouth at bedtime.   Marland Kitchen olmesartan (BENICAR) 20 MG tablet Take 1 tablet (20 mg total) by mouth daily.  . [DISCONTINUED] desvenlafaxine (PRISTIQ) 50 MG 24 hr tablet Take 1 tablet (50 mg total) by mouth daily.  . [DISCONTINUED] olmesartan-hydrochlorothiazide (BENICAR HCT) 20-12.5 MG per tablet Take 1 tablet by mouth daily.     No facility-administered encounter medications on file as of 02/22/2021.    Patient Active Problem List   Diagnosis Date Noted  . Family history of breast cancer 06/16/2020  . Hyperthyroidism 02/28/2018  . Gestational diabetes mellitus 02/28/2018  . LGSIL (low grade squamous intraepithelial dysplasia)   . SVD (spontaneous vaginal delivery) 10/29/2012  . Normal pregnancy, repeat 10/28/2012  . Group B streptococcal infection in pregnancy 10/28/2012  . Abnormal TSH 01/23/2012  . FLATULENCE ERUCTATION AND GAS PAIN 06/30/2010  . COSTOCHONDRITIS 10/21/2009  . WEIGHT GAIN 10/14/2009  . Major depression, chronic 05/08/2009  . CONSTIPATION, MILD 06/27/2008  . VAGINITIS, BACTERIAL 06/27/2008  . GAD (generalized anxiety disorder) 04/04/2008  . ALLERGIC RHINITIS 08/31/2007  . Essential hypertension 04/27/2007  . ACNE NEC 04/27/2007    Past Medical History:  Diagnosis Date  . Abnormal Pap smear   . Acne   . Allergy   . Anxiety   . Depression   . Group B streptococcal infection in pregnancy 10/28/2012  . History of chicken pox   . Hypertension   . Hyperthyroidism   . LGSIL (low grade squamous intraepithelial dysplasia)    Dr. Senaida Ores  . Normal pregnancy, repeat 10/28/2012    Relevant past medical, surgical, family and social history reviewed and updated as indicated. Interim medical history since our last visit reviewed.  Review of Systems  Per HPI unless specifically indicated above     Objective:    There were no vitals taken for this visit.  Wt Readings from Last 3 Encounters:  01/20/21 172 lb 6.4 oz (78.2 kg)  02/25/19 190 lb (86.2 kg)  06/02/17  154 lb (69.9 kg)     Physical Exam  Results for orders placed or performed in visit on 01/20/21  COMPLETE METABOLIC PANEL WITH GFR  Result Value Ref Range   Glucose, Bld 86 65 - 99 mg/dL   BUN 13 7 - 25 mg/dL   Creat 7.98 9.21 - 1.94 mg/dL   GFR, Est Non African American 77 > OR = 60 mL/min/1.36m2   GFR, Est African American 90 > OR = 60 mL/min/1.50m2   BUN/Creatinine Ratio NOT APPLICABLE 6 - 22 (calc)   Sodium 139 135 - 146 mmol/L   Potassium 4.3 3.5 - 5.3 mmol/L   Chloride 104 98 - 110 mmol/L   CO2 25 20 - 32 mmol/L   Calcium 9.4 8.6 - 10.2 mg/dL   Total Protein 6.9 6.1 - 8.1 g/dL   Albumin 4.5 3.6 - 5.1 g/dL   Globulin 2.4 1.9 - 3.7 g/dL (calc)   AG Ratio 1.9 1.0 - 2.5 (calc)   Total Bilirubin 0.3 0.2 - 1.2 mg/dL   Alkaline phosphatase (APISO) 63 31 - 125 U/L   AST 13 10 - 30 U/L   ALT 11 6 - 29 U/L  Lipid panel  Result Value Ref Range   Cholesterol 130 <200 mg/dL   HDL 47 (L) > OR = 50 mg/dL   Triglycerides 42 <174 mg/dL   LDL Cholesterol (Calc) 71 mg/dL (calc)   Total CHOL/HDL Ratio 2.8 <5.0 (calc)   Non-HDL Cholesterol (Calc) 83 <081 mg/dL (calc)  TSH  Result Value Ref Range   TSH 0.79 mIU/L  CBC with Differential/Platelet  Result Value Ref Range   WBC 10.7 3.8 - 10.8 Thousand/uL   RBC 4.46 3.80 - 5.10 Million/uL   Hemoglobin 13.3 11.7 - 15.5 g/dL   HCT 44.8 18.5 - 63.1 %   MCV 91.0 80.0 - 100.0 fL   MCH 29.8 27.0 - 33.0 pg   MCHC 32.8 32.0 - 36.0 g/dL   RDW 49.7 02.6 - 37.8 %   Platelets 227 140 - 400 Thousand/uL   MPV 11.9 7.5 - 12.5 fL   Neutro Abs 7,661 1,500 - 7,800 cells/uL   Lymphs Abs 1,980 850 - 3,900 cells/uL   Absolute Monocytes 931 200 - 950 cells/uL   Eosinophils Absolute 75 15 - 500 cells/uL   Basophils Absolute 54 0 - 200 cells/uL   Neutrophils Relative % 71.6 %   Total Lymphocyte 18.5 %   Monocytes Relative 8.7 %   Eosinophils Relative 0.7 %   Basophils Relative 0.5 %  HIV Antibody (routine testing w rflx)  Result Value Ref Range   HIV 1&2 Ab, 4th  Generation NON-REACTIVE NON-REACTIVE  Hepatitis C antibody  Result Value Ref Range   Hepatitis C Ab NON-REACTIVE NON-REACTIVE   SIGNAL TO CUT-OFF 0.01 <1.00  Hemoglobin A1c  Result Value Ref Range   Hgb A1c MFr Bld 5.1 <5.7 % of total Hgb   Mean Plasma Glucose 100 mg/dL   eAG (mmol/L) 5.5 mmol/L      Assessment & Plan:   Problem List Items Addressed This Visit   None      Follow up plan: No follow-ups on file.

## 2021-02-22 ENCOUNTER — Ambulatory Visit: Payer: 59 | Admitting: Nurse Practitioner

## 2021-03-03 ENCOUNTER — Other Ambulatory Visit (HOSPITAL_COMMUNITY): Payer: Self-pay

## 2021-03-03 MED ORDER — FLUCONAZOLE 150 MG PO TABS
150.0000 mg | ORAL_TABLET | ORAL | 1 refills | Status: DC
  Filled 2021-03-03: qty 2, 4d supply, fill #0

## 2021-03-31 ENCOUNTER — Telehealth (HOSPITAL_COMMUNITY): Payer: Self-pay

## 2021-03-31 DIAGNOSIS — F419 Anxiety disorder, unspecified: Secondary | ICD-10-CM

## 2021-03-31 NOTE — Telephone Encounter (Signed)
This is Dr. Sheela Stack patient. Patient called requesting a refill on her Alprazolam 0.5mg  to be sent [facility] on [address]/[place]. Thank you

## 2021-04-01 MED ORDER — ALPRAZOLAM 0.5 MG PO TABS: 0.5000 mg | ORAL_TABLET | Freq: Every day | ORAL | 0 refills | Status: DC | PRN

## 2021-04-14 ENCOUNTER — Telehealth (INDEPENDENT_AMBULATORY_CARE_PROVIDER_SITE_OTHER): Payer: 59 | Admitting: Psychiatry

## 2021-04-14 ENCOUNTER — Other Ambulatory Visit: Payer: Self-pay

## 2021-04-14 ENCOUNTER — Other Ambulatory Visit (HOSPITAL_COMMUNITY): Payer: Self-pay

## 2021-04-14 ENCOUNTER — Encounter (HOSPITAL_COMMUNITY): Payer: Self-pay | Admitting: Psychiatry

## 2021-04-14 DIAGNOSIS — F319 Bipolar disorder, unspecified: Secondary | ICD-10-CM | POA: Diagnosis not present

## 2021-04-14 DIAGNOSIS — F419 Anxiety disorder, unspecified: Secondary | ICD-10-CM | POA: Diagnosis not present

## 2021-04-14 MED ORDER — LAMOTRIGINE 150 MG PO TABS
300.0000 mg | ORAL_TABLET | Freq: Every day | ORAL | 0 refills | Status: DC
Start: 2021-04-14 — End: 2021-07-15
  Filled 2021-04-14 – 2021-04-30 (×2): qty 180, 90d supply, fill #0

## 2021-04-14 MED ORDER — ARIPIPRAZOLE 2 MG PO TABS
2.0000 mg | ORAL_TABLET | Freq: Every day | ORAL | 0 refills | Status: DC
Start: 2021-04-14 — End: 2021-07-15
  Filled 2021-04-14: qty 90, 90d supply, fill #0

## 2021-04-14 NOTE — Progress Notes (Signed)
Virtual Visit via Telephone Note  I connected with Amanda Strong on 04/14/21 at  8:20 AM EDT by telephone and verified that I am speaking with the correct person using two identifiers.  Location: Patient: Home Provider: Home Office   I discussed the limitations, risks, security and privacy concerns of performing an evaluation and management service by telephone and the availability of in person appointments. I also discussed with the patient that there may be a patient responsible charge related to this service. The patient expressed understanding and agreed to proceed.   History of Present Illness: Patient is evaluated by phone session.  She admitted more emotional, tearful and having more anxiety.  [name] very stressed about her job which she started in March.  Patient was not aware that they will put her in a new program which is very overwhelming.  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  Patient is hoping after September she can go back to [facility].she also like to work on her school so she can get and.  Patient also very upset and sad because recently find out that her best friend's mother diagnosed with cancer.  She admitted 4 to 5 hours sleep.  She denies any suicidal thoughts or homicidal thoughts.  She told that she had stopped taking Abilify because she believed doing well.  Now she noticed since stopped Abilify her emotions are coming back.  [name] taking Xanax 3 times a week to calm her emotions.  [name] concerned about her 40 year old daughter who may require counseling.  Patient has a 973 year old son.  She admitted lately more impulsive, irritable, frustrated.  She admitted impulsive buying which she does not feel good idea.  She admitted having financial stress and does not want to continue impulsive buying.  Recently she had a blood work and her labs are okay but found to have high blood sugar.  [name] taking blood pressure medication.   She has fewer panic attacks but sometimes she feel hopeless, crying spells but denies any hallucination, paranoia or any suicidal thoughts.  Past Psychiatric History:  H/O mood swing, anger, depression, impulsive behavior, throwing things, damaging and punching walls, speeding tickets and THC use. H/O passive suicidal thoughts, emotional and verbal abuse. No h/o mania, psychosis, inpatient or suicidal attempt. Saw Valinda HoarMeredith Baker and tried Klonopin, Cymbalta, Zoloft, Lexapro, Prestiq, hydroxyzine, prozac and Seroquel (Too sleepy).   Recent Results (from the past 2160 hour(s))  COMPLETE METABOLIC PANEL WITH GFR     Status: None   Collection Time: 01/20/21 10:00 AM  Result Value Ref Range   Glucose, Bld 86 65 - 99 mg/dL    Comment: .            Fasting reference interval .    BUN 13 7 - 25 mg/dL   Creat 1.610.93 0.960.50 - 0.451.10 mg/dL   GFR, Est Non African American 77 > OR = 60 mL/min/1.2073m2   GFR, Est African American 90 > OR = 60 mL/min/1.2673m2   BUN/Creatinine Ratio NOT APPLICABLE 6 - 22 (calc)   Sodium 139 135 - 146 mmol/L   Potassium 4.3 3.5 - 5.3 mmol/L   Chloride 104 98 - 110 mmol/L   CO2 25 20 - 32 mmol/L   Calcium 9.4 8.6 - 10.2 mg/dL   Total Protein 6.9 6.1 - 8.1 g/dL   Albumin 4.5 3.6 - 5.1 g/dL   Globulin 2.4 1.9 - 3.7 g/dL (  calc)   AG Ratio 1.9 1.0 - 2.5 (calc)   Total Bilirubin 0.3 0.2 - 1.2 mg/dL   Alkaline phosphatase (APISO) 63 31 - 125 U/L   AST 13 10 - 30 U/L   ALT 11 6 - 29 U/L  Lipid panel     Status: Abnormal   Collection Time: 01/20/21 10:00 AM  Result Value Ref Range   Cholesterol 130 <200 mg/dL   HDL 47 (L) > OR = 50 mg/dL   Triglycerides 42 <941 mg/dL   LDL Cholesterol (Calc) 71 mg/dL (calc)    Comment: Reference range: <100 . Desirable range <100 mg/dL for primary prevention;   <70 mg/dL for patients with CHD or diabetic patients  with > or = 2 CHD risk factors. Marland Kitchen LDL-C is now calculated using the Martin-Hopkins  calculation, which is a validated novel  method providing  better accuracy than the Friedewald equation in the  estimation of LDL-C.  Horald Pollen et al. Lenox Ahr. 7408;144(81): 2061-2068  (http://education.[facility].com/faq/FAQ164)    Total CHOL/HDL Ratio 2.8 <5.0 (calc)   Non-HDL Cholesterol (Calc) 83 <856 mg/dL (calc)    Comment: For patients with diabetes plus 1 major ASCVD risk  factor, treating to a non-HDL-C goal of <100 mg/dL  (LDL-C of <31 mg/dL) is considered a therapeutic  option.   TSH     Status: None   Collection Time: 01/20/21 10:00 AM  Result Value Ref Range   TSH 0.79 mIU/L    Comment:           Reference Range .           > or = 20 Years  0.40-4.50 .                Pregnancy Ranges           First trimester    0.26-2.66           Second trimester   0.55-2.73           Third trimester    0.43-2.91   CBC with Differential/Platelet     Status: None   Collection Time: 01/20/21 10:00 AM  Result Value Ref Range   WBC 10.7 3.8 - 10.8 Thousand/uL   RBC 4.46 3.80 - 5.10 Million/uL   Hemoglobin 13.3 11.7 - 15.5 g/dL   HCT 49.7 02.6 - 37.8 %   MCV 91.0 80.0 - 100.0 fL   MCH 29.8 27.0 - 33.0 pg   MCHC 32.8 32.0 - 36.0 g/dL   RDW 58.8 50.2 - 77.4 %   Platelets 227 140 - 400 Thousand/uL   MPV 11.9 7.5 - 12.5 fL   Neutro Abs 7,661 1,500 - 7,800 cells/uL   Lymphs Abs 1,980 850 - 3,900 cells/uL   Absolute Monocytes 931 200 - 950 cells/uL   Eosinophils Absolute 75 15 - 500 cells/uL   Basophils Absolute 54 0 - 200 cells/uL   Neutrophils Relative % 71.6 %   Total Lymphocyte 18.5 %   Monocytes Relative 8.7 %   Eosinophils Relative 0.7 %   Basophils Relative 0.5 %  HIV Antibody (routine testing w rflx)     Status: None   Collection Time: 01/20/21 10:00 AM  Result Value Ref Range   HIV 1&2 Ab, 4th Generation NON-REACTIVE NON-REACTIVE    Comment: HIV-1 antigen and HIV-1/HIV-2 antibodies were not detected. There is no laboratory evidence of HIV infection. Marland Kitchen PLEASE NOTE: This information has been disclosed  to you from records whose confidentiality may be protected  by state law.  If your state requires such protection, then the state law prohibits you from making any further disclosure of the information without the specific written consent of the person to whom it pertains, or as otherwise permitted by law. A general authorization for the release of medical or other information is NOT sufficient for this purpose. . For additional information please refer to http://education.[facility].com/faq/FAQ106 (This link is being provided for informational/ educational purposes only.) . Marland Kitchen The performance of this assay has not been clinically validated in patients less than 57 years old. .   Hepatitis C antibody     Status: None   Collection Time: 01/20/21 10:00 AM  Result Value Ref Range   Hepatitis C Ab NON-REACTIVE NON-REACTIVE   SIGNAL TO CUT-OFF 0.01 <1.00    Comment: . HCV antibody was non-reactive. There is no laboratory  evidence of HCV infection. . In most cases, no further action is required. However, if recent HCV exposure is suspected, a test for HCV RNA (test code 32355) is suggested. . For additional information please refer to http://education.[facility].com/faq/FAQ22v1 (This link is being provided for informational/ educational purposes only.) .   Hemoglobin A1c     Status: None   Collection Time: 01/20/21 10:00 AM  Result Value Ref Range   Hgb A1c MFr Bld 5.1 <5.7 % of total Hgb    Comment: For the purpose of screening for the presence of diabetes: . <5.7%       Consistent with the absence of diabetes 5.7-6.4%    Consistent with increased risk for diabetes             (prediabetes) > or =6.5%  Consistent with diabetes . This assay result is consistent with a decreased risk of diabetes. . Currently, no consensus exists regarding use of hemoglobin A1c for diagnosis of diabetes in children. . According to American Diabetes Association  (ADA) guidelines, hemoglobin A1c <7.0% represents optimal control in non-pregnant diabetic patients. Different metrics may apply to specific patient populations.  Standards of Medical Care in Diabetes(ADA). .    Mean Plasma Glucose 100 mg/dL   eAG (mmol/L) 5.5 mmol/L       Psychiatric Specialty Exam: Physical Exam  Review of Systems  Weight 170 lb (77.1 kg).There is no height or weight on file to calculate BMI.  General Appearance: NA  Eye Contact:  NA  Speech:  Pressured  Volume:  Normal  Mood:  Anxious, Depressed, and emotional  Affect:  NA  Thought Process:  Descriptions of Associations: Intact  Orientation:  Full (Time, Place, and Person)  Thought Content:  Rumination  Suicidal Thoughts:  No  Homicidal Thoughts:  No  Memory:  Immediate;   Good Recent;   Good Remote;   Good  Judgement:  Fair  Insight:  Shallow  Psychomotor Activity:  NA  Concentration:  Concentration: Fair and Attention Span: Fair  Recall:  Good  Fund of Knowledge:  Good  Language:  Good  Akathisia:  No  Handed:  Right  AIMS (if indicated):     Assets:  Communication Skills Desire for Improvement Housing Social Support Talents/Skills Transportation  ADL's:  Intact  Cognition:  WNL  Sleep:   5-6 hrs      Assessment and Plan: Bipolar disorder type I.  Anxiety.  Patient stopped taking Abilify because she felt doing good.  Her blood pressure is high and taking antihypertensive medication.  Discussed to restart Abilify 2 mg which was helping her and keep the Lamictal 150 mg  twice a day and continue Xanax 0.5 mg to take as needed for severe anxiety.  We also talked about restarting therapy and patient has been in counseling in our office with [facility] and after some discussion agree to restart counseling.  We discussed safety concern.  Patient aware that if at any time having suicidal thoughts or homicidal halogen need to call 911 or go to local emergency room.  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  Follow-up in 3 months as patient aware if she needed earlier appointment when she can call us.  Follow Up Instructions:    I discussed the assessment and treatment plan with the patient. The patient was provided an opportunity to ask questions and all were answered. The patient agreed with the plan and demonstrated an understanding of the instructions.   The patient was advised to call back or seek an in-person evaluation if the symptoms worsen or if the condition fails to improve as anticipated.  I provided 30 minutes of non-face-to-face time during this encounter.   Cleotis Nipper, MD

## 2021-04-22 ENCOUNTER — Other Ambulatory Visit (HOSPITAL_COMMUNITY): Payer: Self-pay

## 2021-04-30 ENCOUNTER — Other Ambulatory Visit (HOSPITAL_COMMUNITY): Payer: Self-pay

## 2021-05-06 ENCOUNTER — Ambulatory Visit (INDEPENDENT_AMBULATORY_CARE_PROVIDER_SITE_OTHER): Payer: 59 | Admitting: Psychiatry

## 2021-05-06 ENCOUNTER — Other Ambulatory Visit: Payer: Self-pay

## 2021-05-06 DIAGNOSIS — F319 Bipolar disorder, unspecified: Secondary | ICD-10-CM | POA: Diagnosis not present

## 2021-05-14 ENCOUNTER — Encounter (HOSPITAL_COMMUNITY): Payer: Self-pay | Admitting: Psychiatry

## 2021-05-14 NOTE — Progress Notes (Signed)
Virtual Visit via Video Note  I connected with Amanda Strong on 05/06/21 at  2:30 PM EDT by a video enabled telemedicine application and verified that I am speaking with the correct person using two identifiers.  Location: Patient: Client Home Provider: Home Office   I discussed the limitations of evaluation and management by telemedicine and the availability of in person appointments. The patient expressed understanding and agreed to proceed.  History of Present Illness: Bipolar I DO  Treatment Plan Goals: 1) Client will apply CBT skills to combat anxiety/fears related to taking professional exam. Client desires to pass exam by 09/18/2021.   2) Client will explore issues around trust and vulnerability, in order to connect more authentically with current or future partners.  Client will apply alternative skills and behaviors when engaging with partner in 4 out of 5 occurences, per self report.   3) Amanda Strong would like to improve effectiveness of parenting skills and strategies, by being intentional, present, assertive in communication and following through on prompts. Client will report back challenges and successes, learning parenting psychoeducation in session from Counselor.   Observations/Objective: Amanda Strong met with Counselor for individual therapy via webex. Counselor joined with Client, assessing mental health needs, symptoms and concerns. Counselor and Client worked to updated CCA and revise treatment plan to better reflect current needs. Client presents as genuine in desire to engage in services. Client reports helplessness, hopelessness, self-doubt, isolation, medication non-compliance and was teary throughout session. Client presents with moderate depression and moderate anxiety. No current safety concerns. No plan/intent to harm self or others. Client would like to reestablish care with provider and meet next month. Client reports medication compliance since last appointment with  provider.   Assessment and Plan: Counselor and Client will meet ongoing until treatment goals are achieved on a every 2-4 week basis. Counselor will coordinate care with psychiatrist as needed. Client will work towards treatment goal outcomes in between sessions and report back in session.  Follow Up Instructions: Counselor to send webex link for next session.   I discussed the assessment and treatment plan with the patient. The patient was provided an opportunity to ask questions and all were answered. The patient agreed with the plan and demonstrated an understanding of the instructions.   The patient was advised to call back or seek an in-person evaluation if the symptoms worsen or if the condition fails to improve as anticipated.  I provided 60 minutes of non-face-to-face time during this encounter.   Lise Auer, LCSW

## 2021-05-17 ENCOUNTER — Emergency Department (HOSPITAL_COMMUNITY)
Admission: EM | Admit: 2021-05-17 | Discharge: 2021-05-17 | Disposition: A | Discharge status: Home / Self Care | Payer: 59 | Attending: Emergency Medicine | Admitting: Emergency Medicine

## 2021-05-17 ENCOUNTER — Encounter (HOSPITAL_COMMUNITY): Payer: Self-pay

## 2021-05-17 ENCOUNTER — Other Ambulatory Visit: Payer: Self-pay

## 2021-05-17 ENCOUNTER — Emergency Department (HOSPITAL_COMMUNITY): Payer: 59

## 2021-05-17 DIAGNOSIS — M79642 Pain in left hand: Secondary | ICD-10-CM | POA: Diagnosis not present

## 2021-05-17 DIAGNOSIS — Z87891 Personal history of nicotine dependence: Secondary | ICD-10-CM | POA: Insufficient documentation

## 2021-05-17 DIAGNOSIS — I10 Essential (primary) hypertension: Secondary | ICD-10-CM | POA: Diagnosis not present

## 2021-05-17 DIAGNOSIS — Z79899 Other long term (current) drug therapy: Secondary | ICD-10-CM | POA: Diagnosis not present

## 2021-05-17 DIAGNOSIS — M533 Sacrococcygeal disorders, not elsewhere classified: Secondary | ICD-10-CM | POA: Diagnosis not present

## 2021-05-17 DIAGNOSIS — W108XXA Fall (on) (from) other stairs and steps, initial encounter: Secondary | ICD-10-CM | POA: Diagnosis not present

## 2021-05-17 DIAGNOSIS — M25552 Pain in left hip: Secondary | ICD-10-CM | POA: Insufficient documentation

## 2021-05-17 DIAGNOSIS — M79632 Pain in left forearm: Secondary | ICD-10-CM | POA: Insufficient documentation

## 2021-05-17 DIAGNOSIS — M79602 Pain in left arm: Secondary | ICD-10-CM | POA: Diagnosis not present

## 2021-05-17 DIAGNOSIS — W19XXXA Unspecified fall, initial encounter: Secondary | ICD-10-CM

## 2021-05-17 LAB — POC URINE PREG, ED: Preg Test, Ur: NEGATIVE

## 2021-05-17 MED ORDER — NAPROXEN 500 MG PO TABS: 500.0000 mg | ORAL_TABLET | Freq: Two times a day (BID) | ORAL | 0 refills | Status: DC | PRN

## 2021-05-17 NOTE — ED Provider Notes (Signed)
Emergency Medicine Provider Triage Evaluation Note  Amanda Strong , a 40 y.o. female  was evaluated in triage.  Pt complains of left forearm pain S/p fall 2 days prior. Slipped and fell down steps, no head injury or LOC. Having pain to the buttocks, left forearm, and left hand. Worse with certain position changes/movements.   Review of Systems  Positive: Arthralgias/myalgias Negative: Chest pain, abdominal pain, numbness, weakness, neck pain  Physical Exam  BP (!) 172/109 (BP Location: Right Arm)   Pulse 89   Temp 98.6 F (37 C)   Resp 16   SpO2 100%  Gen:   Awake, no distress   Resp:  Normal effort  MSK:   Ues: 2+ symmetric radial pulses. Mild swelling to the left forearm. Able to move all joints some. Tender to the diffuse left forearm and to the 4th metacarpal region. Sensation grossly intact and grip strength intact to Ues. Tender to the coccygeal region no additional midline tenderness.   Medical Decision Making  Medically screening exam initiated at 9:15 AM.  Appropriate orders placed.  Amanda Strong was informed that the remainder of the evaluation will be completed by another provider, this initial triage assessment does not replace that evaluation, and the importance of remaining in the ED until their evaluation is complete.  [address] 05/17/21 0938    Wynetta Fines, MD 05/19/21 2241

## 2021-05-17 NOTE — ED Triage Notes (Signed)
Patient had fall on Saturday after tripping and falling down steps. Left forearm pain radiating down to hand with any ROM. Also has bruise to buttock. No loc

## 2021-05-17 NOTE — ED Provider Notes (Signed)
[facility] EMERGENCY DEPARTMENT Provider Note   CSN: [id number] Arrival date & time: 05/17/21  0840     History Chief Complaint  Patient presents with   Amanda Strong is a 40 y.o. female with a hx of hypertension, thyroid dysfunction, and anxiety who presents to the ED S/p fall 2 days prior with complaints primarily of left forearm pain. Patient reports she slipped and fell down the steps. She denies head injury or LOC. She landed on her butt and her left upper extremity. [name] having pain to the left forearm, somewhat to the left hand, and to the left buttocks. Worse with movement and position changes. Reports associated left forearm swelling. Denies numbness, tingling, weakness, open wounds, headache, neck pain, chest pain, or abdominal pain   HPI     Past Medical History:  Diagnosis Date   Abnormal Pap smear    Acne    Allergy    Anxiety    Depression    Group B streptococcal infection in pregnancy 10/28/2012   History of chicken pox    Hypertension    Hyperthyroidism    LGSIL (low grade squamous intraepithelial dysplasia)    Dr. Senaida Ores   Normal pregnancy, repeat 10/28/2012    Patient Active Problem List   Diagnosis Date Noted   Family history of breast cancer 06/16/2020   Hyperthyroidism 02/28/2018   Gestational diabetes mellitus 02/28/2018   LGSIL (low grade squamous intraepithelial dysplasia)    SVD (spontaneous vaginal delivery) 10/29/2012   Normal pregnancy, repeat 10/28/2012   Group B streptococcal infection in pregnancy 10/28/2012   Abnormal TSH 01/23/2012   FLATULENCE ERUCTATION AND GAS PAIN 06/30/2010   COSTOCHONDRITIS 10/21/2009   WEIGHT GAIN 10/14/2009   Major depression, chronic 05/08/2009   CONSTIPATION, MILD 06/27/2008   VAGINITIS, BACTERIAL 06/27/2008   GAD (generalized anxiety disorder) 04/04/2008   ALLERGIC RHINITIS 08/31/2007   Essential hypertension 04/27/2007   ACNE NEC 04/27/2007    Past Surgical  History:  Procedure Laterality Date   BUNIONECTOMY     FOOT SURGERY     WISDOM TOOTH EXTRACTION       OB History     Gravida  3   Para  2   Term  2   Preterm      AB  1   Living  2      SAB  1   IAB      Ectopic      Multiple      Live Births  2           Family History  Problem Relation Age of Onset   Hypertension Mother    Cancer Mother        breast   Hypertension Father    Arthritis Father    Hypertension Maternal Grandmother    Arthritis Maternal Grandmother    Hyperlipidemia Maternal Grandmother    Stroke Maternal Grandfather    Alcohol abuse Maternal Grandfather    Arthritis Maternal Grandfather    Hypertension Paternal Grandmother    Heart disease Paternal Grandmother    Hypertension Paternal Grandfather    Hypertension Maternal Aunt    Hypertension Maternal Uncle    Hypertension Paternal Aunt    Hypertension Paternal Uncle     Social History   Tobacco Use   Smoking status: Former    Types: Cigarettes    Quit date: 10/22/2008    Years since quitting: 12.5   Smokeless tobacco: Never  Vaping Use   Vaping Use: Never used  Substance Use Topics   Alcohol use: No    Alcohol/week: 0.0 standard drinks    Comment: occasionally   Drug use: No    Home Medications Prior to Admission medications   Medication Sig Start Date End Date Taking? Authorizing Provider  ALPRAZolam Prudy Feeler) 0.5 MG tablet Take 1 tablet (0.5 mg total) by mouth daily as needed for anxiety. 04/01/21   Thresa Ross, MD  ARIPiprazole (ABILIFY) 2 MG tablet Take 1 tablet (2 mg total) by mouth daily. 04/14/21   Arfeen, Phillips Grout, MD  fluconazole (DIFLUCAN) 150 MG tablet Take 1 tablet by mouth once. May repeat a dose in 72 hours if symptoms have not completely resolved. 03/02/21     hydrochlorothiazide (HYDRODIURIL) 12.5 MG tablet Take 1 tablet (12.5 mg total) by mouth daily. 02/04/21   Valentino Nose, NP  lamoTRIgine (LAMICTAL) 150 MG tablet Take 2 tablets (300 mg total) by  mouth daily. 04/14/21   Arfeen, Phillips Grout, MD  levocetirizine (XYZAL) 5 MG tablet Take 5 mg by mouth daily. 01/22/18   [provider]  levonorgestrel (MIRENA) 20 MCG/24HR IUD Mirena 20 mcg/24 hours (5 yrs) 52 mg intrauterine device    [provider]  montelukast (SINGULAIR) 10 MG tablet Take 10 mg by mouth at bedtime.    [provider]  olmesartan (BENICAR) 20 MG tablet Take 1 tablet (20 mg total) by mouth daily. 02/19/21   Donita Brooks, MD  desvenlafaxine (PRISTIQ) 50 MG 24 hr tablet Take 1 tablet (50 mg total) by mouth daily. 01/02/12 04/18/12  Eulis Foster, FNP  olmesartan-hydrochlorothiazide (BENICAR HCT) 20-12.5 MG per tablet Take 1 tablet by mouth daily.    04/18/12  [provider]    Allergies    Cephalexin and Percocet [oxycodone-acetaminophen]  Review of Systems   Review of Systems  Constitutional:  Negative for chills and fever.  Respiratory:  Negative for shortness of breath.   Cardiovascular:  Negative for chest pain.  Gastrointestinal:  Negative for abdominal pain and vomiting.  Musculoskeletal:  Positive for arthralgias and myalgias. Negative for neck pain.  Neurological:  Negative for syncope, weakness, numbness and headaches.  All other systems reviewed and are negative.  Physical Exam Updated Vital Signs BP (!) 172/109 (BP Location: Right Arm)   Pulse 89   Temp 98.6 F (37 C)   Resp 16   SpO2 100%   Physical Exam Vitals and nursing note reviewed.  Constitutional:      General: [name] not in acute distress.    Appearance: Normal appearance. [name] well-developed. [name] not ill-appearing or toxic-appearing.  HENT:     Head: Normocephalic and atraumatic. No raccoon eyes or Battle's sign.  Eyes:     General:        Right eye: No discharge.        Left eye: No discharge.     Conjunctiva/sclera: Conjunctivae normal.  Neck:     Comments: No midline tenderness.  Cardiovascular:     Rate and Rhythm: Normal rate and regular  rhythm.     Pulses:          Radial pulses are 2+ on the right side and 2+ on the left side.  Pulmonary:     Effort: Pulmonary effort is normal. No respiratory distress.     Breath sounds: Normal breath sounds. No wheezing, rhonchi or rales.  Chest:     Chest wall: No tenderness.  Abdominal:  General: There is no distension.     Palpations: Abdomen is soft.     Tenderness: There is no abdominal tenderness.  Musculoskeletal:     Cervical back: Normal range of motion and neck supple. No spinous process tenderness.     Comments: Back: Tender to the coccygeal region, no other midline spinal tenderness.  Upper extremities: No obvious deformity, erythema, ecchymosis, pallor, or open wounds. Mild swelling to the left forearm noted. Patient has intact AROM throughout.  Tender to palpation to the left 4th metacarpal and to the left forearm more so dorsally. Otherwise nontender. No anatomical snuffbox tenderness. Compartments are soft.  Lower extremities: Actively moving all major joints without focal tenderness.   Skin:    General: Skin is warm and dry.     Capillary Refill: Capillary refill takes less than 2 seconds.     Findings: No rash.  Neurological:     Mental Status: [name] alert.     Comments: Alert. Clear speech. Sensation grossly intact to bilateral upper extremities. 5/5 symmetric grip strength. Ambulatory.   Psychiatric:        Mood and Affect: Mood normal.        Behavior: Behavior normal.    ED Results / Procedures / Treatments   Labs (all labs ordered are listed, but only abnormal results are displayed) Labs Reviewed  POC URINE PREG, ED    EKG None  Radiology DG Sacrum/Coccyx  Result Date: 05/17/2021 CLINICAL DATA:  Sacral pain after fall down stairs 2 days ago. EXAM: SACRUM AND COCCYX - 2+ VIEW COMPARISON:  None. FINDINGS: There is no evidence of fracture or other focal bone lesions. IMPRESSION: Negative. Electronically Signed   By: Lupita Raider M.D.   On:  05/17/2021 10:38   DG Forearm Left  Result Date: 05/17/2021 CLINICAL DATA:  Left forearm pain after fall. EXAM: LEFT FOREARM - 2 VIEW COMPARISON:  None. FINDINGS: There is no evidence of fracture or other focal bone lesions. Soft tissues are unremarkable. IMPRESSION: Negative. Electronically Signed   By: Lupita Raider M.D.   On: 05/17/2021 10:34   DG Hand Complete Left  Result Date: 05/17/2021 CLINICAL DATA:  Left hand pain after fall. EXAM: LEFT HAND - COMPLETE 3+ VIEW COMPARISON:  None. FINDINGS: There is no evidence of fracture or dislocation. There is no evidence of arthropathy or other focal bone abnormality. Soft tissues are unremarkable. IMPRESSION: Negative. Electronically Signed   By: Lupita Raider M.D.   On: 05/17/2021 10:37    Procedures Procedures   Medications Ordered in ED Medications - No data to display  ED Course  I have reviewed the triage vital signs and the nursing notes.  Pertinent labs & imaging results that were available during my care of the patient were reviewed by me and considered in my medical decision making (see chart for details).    MDM Rules/Calculators/A&P                           Patient presents to the ED with complaints of left upper extremity and left gluteal pain status post mechanical fall a couple days prior.  Patient is nontoxic, blood pressure is elevated, low suspicion for hypertensive emergency.  Additional history obtained:  Additional history obtained from chart review & nursing note review.   Lab Tests:  I Ordered, reviewed, and interpreted labs, which included:  Pregnancy test: Negative  Imaging Studies ordered:  I ordered imaging studies  which included x-rays of the left hand, forearm, and coccyx/sacrum, I independently reviewed, formal radiology impression show no acute fractures.  ED Course:  No signs of years head, neck, or back injury.  No reports of head trauma, other than coccygeal tenderness no midline spinal  tenderness, coccygeal/sacral x-rays negative.  No chest or abdominal tenderness.  X-rays obtained of the left hand and left forearm do not show any fractures or dislocations, patient is neurovascularly intact distally, exam is not clinically consistent with compartment syndrome.  Will place in wrist brace and provide prescription for naproxen with orthopedics follow-up. I discussed results, treatment plan, need for follow-up, and return precautions with the patient. Provided opportunity for questions, patient confirmed understanding and is in agreement with plan.   Portions of this note were generated with Scientist, clinical (histocompatibility and immunogenetics)Dragon dictation software. Dictation errors may occur despite best attempts at proofreading.  Final Clinical Impression(s) / ED Diagnoses Final diagnoses:  Fall, initial encounter    Rx / DC Orders ED Discharge Orders          Ordered    naproxen (NAPROSYN) 500 MG tablet  2 times daily PRN        05/17/21 1131             Vitali Seibert, ChesneeSamantha R, PA-C 05/17/21 1347    Wynetta FinesMessick, Peter C, MD 05/18/21 1030

## 2021-05-17 NOTE — Discharge Instructions (Signed)
Please read and follow all provided instructions.  You have been seen today for left forearm pain after a fall.   Tests performed today include: An x-ray of the affected areas (left hand/forearm and the tailbone area) do NOT show any broken bones or dislocations.  Vital signs. See below for your results today.   Home care instructions: -- *PRICE in the first 24-48 hours: Protect (with brace, splint, sling), if given by your provider Rest Ice- Do not apply ice pack directly to your skin, place towel or similar between your skin and ice/ice pack. Apply ice for 20 min, then remove for 40 min while awake Compression- Wear brace, elastic bandage, splint as directed by your provider Elevate affected extremity above the level of your heart when not walking around for the first 24-48 hours   Medications:   - Naproxen is a nonsteroidal anti-inflammatory medication that will help with pain and swelling. Be sure to take this medication as prescribed with food, 1 pill every 12 hours,  It should be taken with food, as it can cause stomach upset, and more seriously, stomach bleeding. Do not take other nonsteroidal anti-inflammatory medications with this such as Advil, Motrin, Aleve, Mobic, Goodie Powder, or Motrin.    You make take Tylenol per over the counter dosing with these medications.   We have prescribed you new medication(s) today. Discuss the medications prescribed today with your pharmacist as they can have adverse effects and interactions with your other medicines including over the counter and prescribed medications. Seek medical evaluation if you start to experience new or abnormal symptoms after taking one of these medicines, seek care immediately if you start to experience difficulty breathing, feeling of your throat closing, facial swelling, or rash as these could be indications of a more serious allergic reaction   Follow-up instructions: Please follow-up with your primary care provider  or the provided orthopedic physician (bone specialist) if you continue to have significant pain in 1 week. In this case you may have a more severe injury that requires further care.   Return instructions:  Please return if your digits or extremity are numb or tingling, appear gray or blue, or you have severe pain (also elevate the extremity and loosen splint or wrap if you were given one) Please return if you have redness or fevers.  Please return to the Emergency Department if you experience worsening symptoms.  Please return if you have any other emergent concerns. Additional Information:  Your vital signs today were: BP (!) 172/109 (BP Location: Right Arm)   Pulse 89   Temp 98.6 F (37 C)   Resp 16   SpO2 100%  If your blood pressure (BP) was elevated above 135/85 this visit, please have this repeated by your doctor within one month. ---------------

## 2021-05-18 ENCOUNTER — Telehealth: Payer: Self-pay

## 2021-05-18 ENCOUNTER — Ambulatory Visit: Payer: 59 | Admitting: Orthopaedic Surgery

## 2021-05-18 NOTE — Telephone Encounter (Signed)
Lvm for pt to cb to r/s appt for today. Per dr. Magnus Ivan, he wont be able to do much more today and would like to see her back in a week to see if time has helped with pain.

## 2021-05-19 ENCOUNTER — Ambulatory Visit (INDEPENDENT_AMBULATORY_CARE_PROVIDER_SITE_OTHER): Payer: 59 | Admitting: Nurse Practitioner

## 2021-05-19 ENCOUNTER — Encounter: Payer: Self-pay | Admitting: Nurse Practitioner

## 2021-05-19 ENCOUNTER — Other Ambulatory Visit: Payer: Self-pay

## 2021-05-19 VITALS — BP 148/92 | HR 90 | Temp 98.6°F | Resp 14 | Ht 67.0 in | Wt 172.0 lb

## 2021-05-19 DIAGNOSIS — I10 Essential (primary) hypertension: Secondary | ICD-10-CM

## 2021-05-19 MED ORDER — LABETALOL HCL 100 MG PO TABS
100.0000 mg | ORAL_TABLET | Freq: Two times a day (BID) | ORAL | 0 refills | Status: DC
Start: 2021-05-19 — End: 2021-06-18

## 2021-05-19 NOTE — Assessment & Plan Note (Addendum)
Chronic, elevated.  She may be trying to conceive in the near future.  Will switch olmesartan to labetalol 100 mg twice daily.  Check BP daily at home and notify us with readings in 2 weeks. If still elevated, would plan to increase labetalol.

## 2021-05-19 NOTE — Progress Notes (Signed)
Subjective:    Patient ID: Amanda Strong, female    DOB: 02-27-1981, 40 y.o.   MRN: [id number]  HPI: Amanda Strong is a 40 y.o. female presenting for blood pressure follow up.  Chief Complaint  Patient presents with   Medication Review    BP continues to run high- no htn crisis noted Needs refill on Olmesartan   HYPERTENSION It looks like she has been taking olmesartan 20 mg.  [name] considering trying to get pregnant in the next year and is wondering if olmesartan is safe in pregnancy. Hypertension status: uncontrolled  Satisfied with current treatment? no Duration of hypertension: chronic BP monitoring frequency:  rarely BP range: 140s/100s BP medication side effects:  no Medication compliance: excellent Aspirin: no Recurrent headaches: no Visual changes: no Palpitations: no Dyspnea: no Chest pain: no Lower extremity edema: no Dizzy/lightheaded: no  Allergies  Allergen Reactions   Cephalexin     REACTION: intense itching all over   Percocet [Oxycodone-Acetaminophen] Itching    Outpatient Encounter Medications as of 05/19/2021  Medication Sig   ALPRAZolam (XANAX) 0.5 MG tablet Take 1 tablet (0.5 mg total) by mouth daily as needed for anxiety.   ARIPiprazole (ABILIFY) 2 MG tablet Take 1 tablet (2 mg total) by mouth daily.   labetalol (NORMODYNE) 100 MG tablet Take 1 tablet (100 mg total) by mouth 2 (two) times daily.   lamoTRIgine (LAMICTAL) 150 MG tablet Take 2 tablets (300 mg total) by mouth daily.   levocetirizine (XYZAL) 5 MG tablet Take 5 mg by mouth daily.   levonorgestrel (MIRENA) 20 MCG/24HR IUD Mirena 20 mcg/24 hours (5 yrs) 52 mg intrauterine device   montelukast (SINGULAIR) 10 MG tablet Take 10 mg by mouth at bedtime.   naproxen (NAPROSYN) 500 MG tablet Take 1 tablet (500 mg total) by mouth 2 (two) times daily as needed for moderate pain.   [DISCONTINUED] hydrochlorothiazide (HYDRODIURIL) 12.5 MG tablet Take 1 tablet (12.5 mg total) by mouth  daily.   [DISCONTINUED] olmesartan (BENICAR) 20 MG tablet Take 1 tablet (20 mg total) by mouth daily.   [DISCONTINUED] desvenlafaxine (PRISTIQ) 50 MG 24 hr tablet Take 1 tablet (50 mg total) by mouth daily.   [DISCONTINUED] fluconazole (DIFLUCAN) 150 MG tablet Take 1 tablet by mouth once. May repeat a dose in 72 hours if symptoms have not completely resolved.   [DISCONTINUED] olmesartan-hydrochlorothiazide (BENICAR HCT) 20-12.5 MG per tablet Take 1 tablet by mouth daily.     No facility-administered encounter medications on file as of 05/19/2021.    Patient Active Problem List   Diagnosis Date Noted   Family history of breast cancer 06/16/2020   Hyperthyroidism 02/28/2018   Gestational diabetes mellitus 02/28/2018   LGSIL (low grade squamous intraepithelial dysplasia)    SVD (spontaneous vaginal delivery) 10/29/2012   Normal pregnancy, repeat 10/28/2012   Group B streptococcal infection in pregnancy 10/28/2012   Abnormal TSH 01/23/2012   FLATULENCE ERUCTATION AND GAS PAIN 06/30/2010   COSTOCHONDRITIS 10/21/2009   WEIGHT GAIN 10/14/2009   Major depression, chronic 05/08/2009   CONSTIPATION, MILD 06/27/2008   VAGINITIS, BACTERIAL 06/27/2008   GAD (generalized anxiety disorder) 04/04/2008   ALLERGIC RHINITIS 08/31/2007   Essential hypertension 04/27/2007   ACNE NEC 04/27/2007    Past Medical History:  Diagnosis Date   Abnormal Pap smear    Acne    Allergy    Anxiety    Depression    Group B streptococcal infection in pregnancy 10/28/2012   History of chicken  pox    Hypertension    Hyperthyroidism    LGSIL (low grade squamous intraepithelial dysplasia)    Dr. Senaida Ores   Normal pregnancy, repeat 10/28/2012    Relevant past medical, surgical, family and social history reviewed and updated as indicated. Interim medical history since our last visit reviewed.  Review of Systems Per HPI unless specifically indicated above     Objective:    BP (!) 148/92   Pulse 90   Temp  98.6 F (37 C) (Temporal)   Resp 14   Ht 5\' 7"  (1.702 m)   Wt 172 lb (78 kg)   SpO2 99%   BMI 26.94 kg/m   Wt Readings from Last 3 Encounters:  05/19/21 172 lb (78 kg)  01/20/21 172 lb 6.4 oz (78.2 kg)  02/25/19 190 lb (86.2 kg)    Physical Exam Vitals and nursing note reviewed.  Constitutional:      General: [name] not in acute distress.    Appearance: Normal appearance. [name] not toxic-appearing.  Eyes:     General: No scleral icterus.       Right eye: No discharge.        Left eye: No discharge.     Extraocular Movements: Extraocular movements intact.     Pupils: Pupils are equal, round, and reactive to light.  Neck:     Vascular: No carotid bruit.  Cardiovascular:     Rate and Rhythm: Normal rate and regular rhythm.  Pulmonary:     Effort: Pulmonary effort is normal. No respiratory distress.     Breath sounds: Normal breath sounds. No wheezing, rhonchi or rales.  Abdominal:     General: Abdomen is flat. Bowel sounds are normal.     Palpations: Abdomen is soft.  Musculoskeletal:        General: Normal range of motion.     Cervical back: Normal range of motion.     Right lower leg: No edema.     Left lower leg: No edema.  Lymphadenopathy:     Cervical: No cervical adenopathy.  Skin:    General: Skin is warm and dry.     Capillary Refill: Capillary refill takes less than 2 seconds.     Coloration: Skin is not jaundiced or pale.     Findings: No erythema.  Neurological:     General: No focal deficit present.     Mental Status: [name] alert and oriented to person, place, and time.     Sensory: Sensation is intact.     Motor: Motor function is intact. No weakness.     Coordination: Coordination is intact. Coordination normal.     Gait: Gait is intact. Gait normal.  Psychiatric:        Mood and Affect: Mood normal.        Behavior: Behavior normal.        Thought Content: Thought content normal.        Judgment: Judgment normal.      Assessment & Plan:    Problem List Items Addressed This Visit       Cardiovascular and Mediastinum   Essential hypertension - Primary    Chronic, elevated.  She may be trying to conceive in the near future.  Will switch olmesartan to labetalol 100 mg twice daily.  Check BP daily at home and notify 04/27/19 with readings in 2 weeks. If still elevated, would plan to increase labetalol.        Relevant Medications  labetalol (NORMODYNE) 100 MG tablet      Follow up plan: Return in about 4 weeks (around 06/16/2021) for BP recheck.

## 2021-05-26 ENCOUNTER — Other Ambulatory Visit (HOSPITAL_COMMUNITY): Payer: Self-pay | Admitting: *Deleted

## 2021-05-26 DIAGNOSIS — F419 Anxiety disorder, unspecified: Secondary | ICD-10-CM

## 2021-05-26 MED ORDER — ALPRAZOLAM 0.5 MG PO TABS: 0.5000 mg | ORAL_TABLET | Freq: Every day | ORAL | 0 refills | Status: DC | PRN

## 2021-05-27 ENCOUNTER — Other Ambulatory Visit: Payer: Self-pay

## 2021-05-27 ENCOUNTER — Ambulatory Visit (HOSPITAL_COMMUNITY): Payer: 59 | Admitting: Psychiatry

## 2021-06-17 DIAGNOSIS — Z6826 Body mass index (BMI) 26.0-26.9, adult: Secondary | ICD-10-CM | POA: Diagnosis not present

## 2021-06-17 DIAGNOSIS — Z01419 Encounter for gynecological examination (general) (routine) without abnormal findings: Secondary | ICD-10-CM | POA: Diagnosis not present

## 2021-06-17 DIAGNOSIS — Z1231 Encounter for screening mammogram for malignant neoplasm of breast: Secondary | ICD-10-CM | POA: Diagnosis not present

## 2021-06-17 DIAGNOSIS — Z124 Encounter for screening for malignant neoplasm of cervix: Secondary | ICD-10-CM | POA: Diagnosis not present

## 2021-06-17 DIAGNOSIS — Z803 Family history of malignant neoplasm of breast: Secondary | ICD-10-CM | POA: Diagnosis not present

## 2021-06-17 DIAGNOSIS — Z130 Encounter for screening for diseases of the blood and blood-forming organs and certain disorders involving the immune mechanism: Secondary | ICD-10-CM | POA: Diagnosis not present

## 2021-06-17 DIAGNOSIS — Z1389 Encounter for screening for other disorder: Secondary | ICD-10-CM | POA: Diagnosis not present

## 2021-06-17 DIAGNOSIS — Z1151 Encounter for screening for human papillomavirus (HPV): Secondary | ICD-10-CM | POA: Diagnosis not present

## 2021-06-17 DIAGNOSIS — Z113 Encounter for screening for infections with a predominantly sexual mode of transmission: Secondary | ICD-10-CM | POA: Diagnosis not present

## 2021-06-18 ENCOUNTER — Other Ambulatory Visit: Payer: Self-pay | Admitting: Nurse Practitioner

## 2021-06-18 DIAGNOSIS — I10 Essential (primary) hypertension: Secondary | ICD-10-CM

## 2021-07-15 ENCOUNTER — Telehealth (HOSPITAL_BASED_OUTPATIENT_CLINIC_OR_DEPARTMENT_OTHER): Payer: 59 | Admitting: Psychiatry

## 2021-07-15 ENCOUNTER — Other Ambulatory Visit: Payer: Self-pay

## 2021-07-15 ENCOUNTER — Other Ambulatory Visit (HOSPITAL_COMMUNITY): Payer: Self-pay

## 2021-07-15 ENCOUNTER — Encounter (HOSPITAL_COMMUNITY): Payer: Self-pay | Admitting: Psychiatry

## 2021-07-15 DIAGNOSIS — F319 Bipolar disorder, unspecified: Secondary | ICD-10-CM | POA: Diagnosis not present

## 2021-07-15 DIAGNOSIS — F419 Anxiety disorder, unspecified: Secondary | ICD-10-CM | POA: Diagnosis not present

## 2021-07-15 MED ORDER — ARIPIPRAZOLE 2 MG PO TABS
2.0000 mg | ORAL_TABLET | Freq: Every day | ORAL | 0 refills | Status: DC
Start: 2021-07-15 — End: 2021-09-24
  Filled 2021-07-15 – 2021-08-27 (×3): qty 90, 90d supply, fill #0

## 2021-07-15 MED ORDER — LAMOTRIGINE 150 MG PO TABS
300.0000 mg | ORAL_TABLET | Freq: Every day | ORAL | 0 refills | Status: DC
Start: 2021-07-15 — End: 2021-11-11
  Filled 2021-07-15 – 2021-08-27 (×3): qty 180, 90d supply, fill #0
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
## 2021-07-19 ENCOUNTER — Encounter: Payer: Self-pay | Admitting: Nurse Practitioner

## 2021-07-19 NOTE — Telephone Encounter (Signed)
Please advise on pt's request to change HTN meds. Thank you!

## 2021-07-19 NOTE — Telephone Encounter (Signed)
Please have her schedule appointment to discuss 

## 2021-07-21 ENCOUNTER — Other Ambulatory Visit: Payer: Self-pay

## 2021-07-21 ENCOUNTER — Ambulatory Visit: Payer: 59 | Admitting: Nurse Practitioner

## 2021-07-21 ENCOUNTER — Encounter: Payer: Self-pay | Admitting: Nurse Practitioner

## 2021-07-21 VITALS — BP 148/102 | HR 76 | Temp 98.7°F | Resp 18 | Ht 67.0 in | Wt 172.0 lb

## 2021-07-21 DIAGNOSIS — I10 Essential (primary) hypertension: Secondary | ICD-10-CM

## 2021-07-21 MED ORDER — OLMESARTAN MEDOXOMIL-HCTZ 40-25 MG PO TABS: 1.0000 | ORAL_TABLET | Freq: Every day | ORAL | 1 refills | Status: DC

## 2021-07-21 NOTE — Progress Notes (Signed)
Subjective:    Patient ID: Amanda Strong, female    DOB: [birth year], 40 y.o.   MRN: [id number]  HPI: Amanda Strong is a 40 y.o. female presenting for blood pressure follow up.  Chief Complaint  Patient presents with   Hypertension   HYPERTENSION At last visit, we switched olmesartan/hctz to labetalol because she was planning a possible pregnancy.  [name] now not desiring pregnancy in the future and wants to put her health first.  She has an IUD.  She reports it is difficult for her to take twice daily medication. Hypertension status: uncontrolled  BP monitoring frequency:  daily BP range:  140s-160s/100s Medication compliance: fair compliance Aspirin: no Recurrent headaches: yes Visual changes: no Palpitations: no Dyspnea: no Chest pain: no Lower extremity edema: no Dizzy/lightheaded: no  Allergies  Allergen Reactions   Cephalexin     REACTION: intense itching all over   Percocet [Oxycodone-Acetaminophen] Itching    Outpatient Encounter Medications as of 07/21/2021  Medication Sig   ALPRAZolam (XANAX) 0.5 MG tablet Take 1 tablet (0.5 mg total) by mouth daily as needed for anxiety.   ARIPiprazole (ABILIFY) 2 MG tablet Take 1 tablet (2 mg total) by mouth daily.   lamoTRIgine (LAMICTAL) 150 MG tablet Take 2 tablets (300 mg total) by mouth daily.   levonorgestrel (MIRENA, 52 MG,) 20 MCG/DAY IUD Mirena 20 mcg/24 hours (8 yrs) 52 mg intrauterine device  Take 1 device every day by intrauterine route.   olmesartan-hydrochlorothiazide (BENICAR HCT) 40-25 MG tablet Take 1 tablet by mouth daily.   [DISCONTINUED] labetalol (NORMODYNE) 100 MG tablet TAKE 1 TABLET BY MOUTH TWICE A DAY   levocetirizine (XYZAL) 5 MG tablet Take 5 mg by mouth daily. (Patient not taking: Reported on 07/21/2021)   montelukast (SINGULAIR) 10 MG tablet Take 10 mg by mouth at bedtime. (Patient not taking: Reported on 07/21/2021)   naproxen (NAPROSYN) 500 MG tablet Take 1 tablet (500 mg total) by mouth  2 (two) times daily as needed for moderate pain. (Patient not taking: Reported on 07/21/2021)   [DISCONTINUED] desvenlafaxine (PRISTIQ) 50 MG 24 hr tablet Take 1 tablet (50 mg total) by mouth daily.   [DISCONTINUED] levonorgestrel (MIRENA) 20 MCG/24HR IUD Mirena 20 mcg/24 hours (5 yrs) 52 mg intrauterine device   [DISCONTINUED] olmesartan-hydrochlorothiazide (BENICAR HCT) 20-12.5 MG per tablet Take 1 tablet by mouth daily.     No facility-administered encounter medications on file as of 07/21/2021.    Patient Active Problem List   Diagnosis Date Noted   Family history of breast cancer 06/16/2020   Hyperthyroidism 02/28/2018   Gestational diabetes mellitus 02/28/2018   LGSIL (low grade squamous intraepithelial dysplasia)    SVD (spontaneous vaginal delivery) 10/29/2012   Normal pregnancy, repeat 10/28/2012   Group B streptococcal infection in pregnancy 10/28/2012   Abnormal TSH 01/23/2012   FLATULENCE ERUCTATION AND GAS PAIN 06/30/2010   COSTOCHONDRITIS 10/21/2009   WEIGHT GAIN 10/14/2009   Major depression, chronic 05/08/2009   CONSTIPATION, MILD 06/27/2008   VAGINITIS, BACTERIAL 06/27/2008   GAD (generalized anxiety disorder) 04/04/2008   ALLERGIC RHINITIS 08/31/2007   Essential hypertension 04/27/2007   ACNE NEC 04/27/2007    Past Medical History:  Diagnosis Date   Abnormal Pap smear    Acne    Allergy    Anxiety    Depression    Group B streptococcal infection in pregnancy 10/28/2012   History of chicken pox    Hypertension    Hyperthyroidism    LGSIL (low  grade squamous intraepithelial dysplasia)    Dr. Senaida Ores   Normal pregnancy, repeat 10/28/2012    Relevant past medical, surgical, family and social history reviewed and updated as indicated. Interim medical history since our last visit reviewed.  Review of Systems Per HPI unless specifically indicated above     Objective:    BP (!) 148/102 (BP Location: Left Arm, Patient Position: Sitting)   Pulse 76   Temp  98.7 F (37.1 C) (Oral)   Resp 18   Ht 5\' 7"  (1.702 m)   Wt 172 lb (78 kg)   SpO2 98%   BMI 26.94 kg/m   Wt Readings from Last 3 Encounters:  07/21/21 172 lb (78 kg)  05/19/21 172 lb (78 kg)  01/20/21 172 lb 6.4 oz (78.2 kg)    Physical Exam Vitals and nursing note reviewed.  Constitutional:      General: [name] not in acute distress.    Appearance: Normal appearance. [name] not toxic-appearing.  Eyes:     General: No scleral icterus.       Right eye: No discharge.        Left eye: No discharge.     Extraocular Movements: Extraocular movements intact.  Cardiovascular:     Rate and Rhythm: Normal rate and regular rhythm.     Heart sounds: Normal heart sounds. No murmur heard. Pulmonary:     Effort: Pulmonary effort is normal. No respiratory distress.     Breath sounds: Normal breath sounds. No wheezing, rhonchi or rales.  Musculoskeletal:        General: Normal range of motion.     Right lower leg: No edema.     Left lower leg: No edema.  Skin:    General: Skin is warm and dry.     Capillary Refill: Capillary refill takes less than 2 seconds.     Coloration: Skin is not jaundiced or pale.     Findings: No erythema.  Neurological:     Mental Status: [name] alert and oriented to person, place, and time.     Sensory: Sensation is intact.     Motor: Motor function is intact. No weakness.     Coordination: Coordination is intact.     Gait: Gait is intact. Gait normal.  Psychiatric:        Mood and Affect: Mood normal.        Behavior: Behavior normal.        Thought Content: Thought content normal.        Judgment: Judgment normal.      Assessment & Plan:   Problem List Items Addressed This Visit       Cardiovascular and Mediastinum   Essential hypertension - Primary    Chronic, uncontrolled.  Will resume and increase to olmesartan-hctz 40-25.  Check BMET today.  Recheck BP in 1 month.       Relevant Medications   olmesartan-hydrochlorothiazide (BENICAR HCT)  40-25 MG tablet   Other Relevant Orders   BASIC METABOLIC PANEL WITH GFR (Completed)     Follow up plan: Return in about 4 weeks (around 08/18/2021) for BP f/u.

## 2021-07-22 LAB — BASIC METABOLIC PANEL WITH GFR
BUN: 14 mg/dL (ref 7–25)
CO2: 27 mmol/L (ref 20–32)
Calcium: 9.6 mg/dL (ref 8.6–10.2)
Chloride: 104 mmol/L (ref 98–110)
Creat: 0.75 mg/dL (ref 0.50–0.97)
Glucose, Bld: 103 mg/dL — ABNORMAL HIGH (ref 65–99)
Potassium: 3.9 mmol/L (ref 3.5–5.3)
Sodium: 139 mmol/L (ref 135–146)
eGFR: 104 mL/min/{1.73_m2} (ref >=60)

## 2021-07-22 NOTE — Assessment & Plan Note (Signed)
Chronic, uncontrolled.  Will resume and increase to olmesartan-hctz 40-25.  Check BMET today.  Recheck BP in 1 month.

## 2021-07-23 ENCOUNTER — Other Ambulatory Visit (HOSPITAL_COMMUNITY): Payer: Self-pay

## 2021-07-29 ENCOUNTER — Other Ambulatory Visit (HOSPITAL_COMMUNITY): Payer: Self-pay

## 2021-08-01 ENCOUNTER — Encounter: Payer: Self-pay | Admitting: Nurse Practitioner

## 2021-08-02 ENCOUNTER — Encounter: Payer: Self-pay | Admitting: Nurse Practitioner

## 2021-08-02 ENCOUNTER — Other Ambulatory Visit: Payer: Self-pay | Admitting: *Deleted

## 2021-08-02 DIAGNOSIS — R300 Dysuria: Secondary | ICD-10-CM

## 2021-08-02 NOTE — Telephone Encounter (Signed)
See previous message

## 2021-08-02 NOTE — Telephone Encounter (Signed)
I can see her Wednesday.  Otherwise, she can do an E-visit on mychart for UTI symptoms

## 2021-08-02 NOTE — Telephone Encounter (Signed)
Needs appointment for UTI - she can drop off urine sample and we can do virtual if she would prefer.  What BP numbers is she seeing?  Can she cut BP pill in half?

## 2021-08-02 NOTE — Telephone Encounter (Signed)
I am not in office tomorrow - does PCP have openings for UTI?  Her BP medication does come in 40/12.5, however it sounds like her blood pressure is much improved with this medication.  Dry mouth is side effect, if this is intolerable we can decrease but will have to add in another medication.

## 2021-08-04 ENCOUNTER — Other Ambulatory Visit: Payer: Self-pay

## 2021-08-04 ENCOUNTER — Other Ambulatory Visit (INDEPENDENT_AMBULATORY_CARE_PROVIDER_SITE_OTHER): Payer: 59

## 2021-08-04 ENCOUNTER — Telehealth: Payer: 59 | Admitting: Nurse Practitioner

## 2021-08-04 VITALS — BP 139/89

## 2021-08-04 DIAGNOSIS — N3001 Acute cystitis with hematuria: Secondary | ICD-10-CM | POA: Diagnosis not present

## 2021-08-04 DIAGNOSIS — I10 Essential (primary) hypertension: Secondary | ICD-10-CM | POA: Diagnosis not present

## 2021-08-04 DIAGNOSIS — R350 Frequency of micturition: Secondary | ICD-10-CM | POA: Diagnosis not present

## 2021-08-04 LAB — URINALYSIS, ROUTINE W REFLEX MICROSCOPIC
Bacteria, UA: NONE SEEN /HPF
Bilirubin Urine: NEGATIVE
Glucose, UA: NEGATIVE
Hyaline Cast: NONE SEEN /LPF
Ketones, ur: NEGATIVE
Leukocytes,Ua: NEGATIVE
Nitrite: NEGATIVE
Protein, ur: NEGATIVE
Specific Gravity, Urine: 1.025 (ref 1.001–1.035)
WBC, UA: NONE SEEN /HPF (ref 0–5)
pH: 6.5 (ref 5.0–8.0)

## 2021-08-04 LAB — MICROSCOPIC MESSAGE

## 2021-08-04 MED ORDER — OLMESARTAN MEDOXOMIL-HCTZ 40-12.5 MG PO TABS: 1.0000 | ORAL_TABLET | Freq: Every day | ORAL | 1 refills | Status: DC

## 2021-08-04 MED ORDER — AMLODIPINE BESYLATE 5 MG PO TABS: 5.0000 mg | ORAL_TABLET | Freq: Every day | ORAL | 1 refills | Status: DC

## 2021-08-04 MED ORDER — NITROFURANTOIN MONOHYD MACRO 100 MG PO CAPS: 100.0000 mg | ORAL_CAPSULE | Freq: Two times a day (BID) | ORAL | 0 refills | Status: DC

## 2021-08-04 NOTE — Progress Notes (Signed)
Subjective:    Patient ID: Amanda Strong, female    DOB: 08/22/1981, 40 y.o.   MRN: [id number]  HPI: Amanda Strong is a 40 y.o. female presenting for UTI and BP medication adjustment.   Chief Complaint  Patient presents with   Urinary Frequency   Hypertension    URINARY SYMPTOMS Duration: days Dysuria: yes Urinary frequency: yes Urgency: yes Small volume voids: no Symptom severity: moderate Urinary incontinence: no Foul odor: no Hematuria: no Abdominal pain: no Back pain: no Suprapubic pain/pressure: no Flank pain: no Fever:  no Nausea: no Vomiting: no Relief with cranberry juice: not tried Relief with pyridium: not tried Status: stable Previous urinary tract infection: yes Recurrent urinary tract infection: no Sexual activity: No sexually active History of sexually transmitted disease: no Vaginal discharge: no Treatments attempted: nothing tried, increasing fluids   Patient reports she thinks the diuretic in the blood pressure medication is too strong.  She works night shift 7pm-7am and on her days off tries to take the medication mid day.  She states she is peeing all of the time and this is very bothersome.  Allergies  Allergen Reactions   Cephalexin     REACTION: intense itching all over   Percocet [Oxycodone-Acetaminophen] Itching    Outpatient Encounter Medications as of 08/04/2021  Medication Sig   amLODipine (NORVASC) 5 MG tablet Take 1 tablet (5 mg total) by mouth daily.   nitrofurantoin, macrocrystal-monohydrate, (MACROBID) 100 MG capsule Take 1 capsule (100 mg total) by mouth 2 (two) times daily.   olmesartan-hydrochlorothiazide (BENICAR HCT) 40-12.5 MG tablet Take 1 tablet by mouth daily.   ALPRAZolam (XANAX) 0.5 MG tablet Take 1 tablet (0.5 mg total) by mouth daily as needed for anxiety.   ARIPiprazole (ABILIFY) 2 MG tablet Take 1 tablet (2 mg total) by mouth daily.   lamoTRIgine (LAMICTAL) 150 MG tablet Take 2 tablets (300 mg total)  by mouth daily.   levocetirizine (XYZAL) 5 MG tablet Take 5 mg by mouth daily. (Patient not taking: Reported on 07/21/2021)   levonorgestrel (MIRENA, 52 MG,) 20 MCG/DAY IUD Mirena 20 mcg/24 hours (8 yrs) 52 mg intrauterine device  Take 1 device every day by intrauterine route.   montelukast (SINGULAIR) 10 MG tablet Take 10 mg by mouth at bedtime. (Patient not taking: Reported on 07/21/2021)   naproxen (NAPROSYN) 500 MG tablet Take 1 tablet (500 mg total) by mouth 2 (two) times daily as needed for moderate pain. (Patient not taking: Reported on 07/21/2021)   [DISCONTINUED] desvenlafaxine (PRISTIQ) 50 MG 24 hr tablet Take 1 tablet (50 mg total) by mouth daily.   [DISCONTINUED] olmesartan-hydrochlorothiazide (BENICAR HCT) 40-25 MG tablet Take 1 tablet by mouth daily.   No facility-administered encounter medications on file as of 08/04/2021.    Patient Active Problem List   Diagnosis Date Noted   Family history of breast cancer 06/16/2020   Hyperthyroidism 02/28/2018   Gestational diabetes mellitus 02/28/2018   LGSIL (low grade squamous intraepithelial dysplasia)    SVD (spontaneous vaginal delivery) 10/29/2012   Normal pregnancy, repeat 10/28/2012   Group B streptococcal infection in pregnancy 10/28/2012   Abnormal TSH 01/23/2012   FLATULENCE ERUCTATION AND GAS PAIN 06/30/2010   COSTOCHONDRITIS 10/21/2009   WEIGHT GAIN 10/14/2009   Major depression, chronic 05/08/2009   CONSTIPATION, MILD 06/27/2008   VAGINITIS, BACTERIAL 06/27/2008   GAD (generalized anxiety disorder) 04/04/2008   ALLERGIC RHINITIS 08/31/2007   Essential hypertension 04/27/2007   ACNE NEC 04/27/2007    Past Medical  History:  Diagnosis Date   Abnormal Pap smear    Acne    Allergy    Anxiety    Depression    Group B streptococcal infection in pregnancy 10/28/2012   History of chicken pox    Hypertension    Hyperthyroidism    LGSIL (low grade squamous intraepithelial dysplasia)    Dr. Marvel Plan   Normal  pregnancy, repeat 10/28/2012    Relevant past medical, surgical, family and social history reviewed and updated as indicated. Interim medical history since our last visit reviewed.  Review of Systems Per HPI unless specifically indicated above     Objective:    BP 139/89   Wt Readings from Last 3 Encounters:  07/21/21 172 lb (78 kg)  05/19/21 172 lb (78 kg)  01/20/21 172 lb 6.4 oz (78.2 kg)    Physical Exam Vitals and nursing note reviewed.  Constitutional:      General: [name] not in acute distress.    Appearance: Normal appearance. [name] not toxic-appearing.  HENT:     Head: Normocephalic and atraumatic.  Cardiovascular:     Comments: Unable to assess heart sounds via virtual visit. Pulmonary:     Effort: Pulmonary effort is normal. No respiratory distress.     Comments: Unable to assess breath sounds via virtual visit.  Patient talking in complete sentences during telemedicine visit. Skin:    Coloration: Skin is not jaundiced or pale.     Findings: No erythema.  Neurological:     Mental Status: [name] alert and oriented to person, place, and time.  Psychiatric:        Mood and Affect: Mood normal.        Behavior: Behavior normal.        Thought Content: Thought content normal.        Judgment: Judgment normal.      Assessment & Plan:   Problem List Items Addressed This Visit       Cardiovascular and Mediastinum   Essential hypertension    Chronic, improving.  Due to side effect of urinary frequency, decrease olmesartan-HCTZ to 40-12.5 mg daily.  Start amlodipine 5 mg daily.  Continue checking BP at home and notify if readings >140/90.  Follow up in 1 month to recheck BP.      Relevant Medications   olmesartan-hydrochlorothiazide (BENICAR HCT) 40-12.5 MG tablet   amLODipine (NORVASC) 5 MG tablet   Other Visit Diagnoses     Acute cystitis with hematuria    -  Primary   UA shows small amount of blood today, send for culture.  Will treat based on symptoms and  hematuria with macrobid bid x 5 days.     Relevant Medications   nitrofurantoin, macrocrystal-monohydrate, (MACROBID) 100 MG capsule   Other Relevant Orders   Urinalysis, Routine w reflex microscopic (Completed)   Urine Culture   Microscopic Message (Completed)        Follow up plan: Return in about 4 weeks (around 09/01/2021) for BP follow up.  Due to the catastrophic nature of the COVID-19 pandemic, this video visit was completed soley via audio and visual contact via Caregility due to the restrictions of the COVID-19 pandemic.  All issues as above were discussed and addressed. Physical exam was done as above through visual confirmation on Caregility. If it was felt that the patient should be evaluated in the office, they were directed there. The patient verbally consented to this visit. Location of the patient: home Location of the  provider: work Those involved with this call:  Provider: Noemi Chapel, DNP, FNP-C CMA: n/a Front Desk/Registration: Vevelyn Pat  Time spent on call:  12 minutes with patient face to face via video conference. More than 50% of this time was spent in counseling and coordination of care. 15 minutes total spent in review of patient's record and preparation of their chart. I verified patient identity using two factors (patient name and date of birth). Patient consents verbally to being seen via telemedicine visit today.

## 2021-08-04 NOTE — Assessment & Plan Note (Signed)
Chronic, improving.  Due to side effect of urinary frequency, decrease olmesartan-HCTZ to 40-12.5 mg daily.  Start amlodipine 5 mg daily.  Continue checking BP at home and notify if readings >140/90.  Follow up in 1 month to recheck BP.

## 2021-08-05 ENCOUNTER — Telehealth: Payer: Self-pay | Admitting: Family Medicine

## 2021-08-05 ENCOUNTER — Telehealth (HOSPITAL_COMMUNITY): Payer: Self-pay | Admitting: *Deleted

## 2021-08-05 DIAGNOSIS — F419 Anxiety disorder, unspecified: Secondary | ICD-10-CM

## 2021-08-05 DIAGNOSIS — I10 Essential (primary) hypertension: Secondary | ICD-10-CM

## 2021-08-05 LAB — URINE CULTURE
MICRO NUMBER:: 12645372
Result:: NO GROWTH
SPECIMEN QUALITY:: ADEQUATE

## 2021-08-05 MED ORDER — OLMESARTAN MEDOXOMIL-HCTZ 40-12.5 MG PO TABS: 1.0000 | ORAL_TABLET | Freq: Every day | ORAL | 1 refills | Status: DC

## 2021-08-05 NOTE — Telephone Encounter (Signed)
Patient called to follow up on refill requests for b/p medications; Rx for needs to be called in for olmesartan-hydrochlorothiazide [facility] HCT) 40-12.5 MG tablet [[id number]]    Pharmacy confirmed as  [id number] [place], [place] - [address]  [address] [place] [address]  Phone:  [phone]  Fax:  [phone]  DEA #:  [id number]  Please advise at [phone].

## 2021-08-05 NOTE — Telephone Encounter (Signed)
Prescription sent to pharmacy.

## 2021-08-05 NOTE — Telephone Encounter (Signed)
Pt called requesting refill of Xanax 0.5 mg. Last fill 05/26/21. Pt has an upcoming appointment scheduled for 10/14/20. Please review. Thanks.

## 2021-08-06 ENCOUNTER — Other Ambulatory Visit (HOSPITAL_COMMUNITY): Payer: Self-pay

## 2021-08-06 MED ORDER — ALPRAZOLAM 0.5 MG PO TABS: 0.5000 mg | ORAL_TABLET | Freq: Every day | ORAL | 0 refills | Status: DC | PRN

## 2021-08-06 NOTE — Telephone Encounter (Signed)
Sent to [facility] [employer]

## 2021-08-10 ENCOUNTER — Encounter: Payer: Self-pay | Admitting: Nurse Practitioner

## 2021-08-11 ENCOUNTER — Telehealth (INDEPENDENT_AMBULATORY_CARE_PROVIDER_SITE_OTHER): Payer: 59 | Admitting: Nurse Practitioner

## 2021-08-11 ENCOUNTER — Encounter: Payer: Self-pay | Admitting: Nurse Practitioner

## 2021-08-11 ENCOUNTER — Other Ambulatory Visit: Payer: Self-pay

## 2021-08-11 DIAGNOSIS — T3695XA Adverse effect of unspecified systemic antibiotic, initial encounter: Secondary | ICD-10-CM

## 2021-08-11 DIAGNOSIS — B9689 Other specified bacterial agents as the cause of diseases classified elsewhere: Secondary | ICD-10-CM

## 2021-08-11 DIAGNOSIS — J019 Acute sinusitis, unspecified: Secondary | ICD-10-CM

## 2021-08-11 DIAGNOSIS — B379 Candidiasis, unspecified: Secondary | ICD-10-CM

## 2021-08-11 MED ORDER — AMOXICILLIN-POT CLAVULANATE 875-125 MG PO TABS: 1.0000 | ORAL_TABLET | Freq: Two times a day (BID) | ORAL | 0 refills | Status: AC

## 2021-08-11 MED ORDER — FLUCONAZOLE 150 MG PO TABS
150.0000 mg | ORAL_TABLET | Freq: Once | ORAL | 0 refills | Status: AC
Start: 2021-08-11 — End: 2021-08-11

## 2021-08-11 NOTE — Progress Notes (Signed)
Subjective:    Patient ID: Amanda Strong, female    DOB: November 17, 1980, 40 y.o.   MRN: [id number]  HPI: Amanda Strong is a 40 y.o. female presenting virtually for sinus infection.  Chief Complaint  Patient presents with   Sinusitis    UPPER RESPIRATORY TRACT INFECTION Onset: Tuesday-Wednesday last week COVID-19 testing: negative at home Fever: no Cough: yes - dry - tickle Shortness of breath: no Wheezing: no Chest pain: no Chest tightness: no Chest congestion: no Nasal congestion: yes Runny nose: no Post nasal drip: yes Sneezing: yes Sore throat: no Swollen glands: yes Sinus pressure: yes; above eyes Headache: no Face pain: no Toothache:  yes -gums  Ear pain: no  Ear pressure: no  Eyes red/itching:no Eye drainage/crusting: no  Nausea: no  Vomiting: no Diarrhea: yes  Change in appetite: no  Loss of taste/smell: no  Rash: no Fatigue: no Sick contacts: no Strep contacts: no  Context: stable Recurrent sinusitis: no Treatments attempted: cold/flu Relief with OTC medications: yes, symptoms return when medication wears out  Allergies  Allergen Reactions   Cephalexin     REACTION: intense itching all over   Percocet [Oxycodone-Acetaminophen] Itching    Outpatient Encounter Medications as of 08/11/2021  Medication Sig   amoxicillin-clavulanate (AUGMENTIN) 875-125 MG tablet Take 1 tablet by mouth 2 (two) times daily for 7 days.   ALPRAZolam (XANAX) 0.5 MG tablet Take 1 tablet (0.5 mg total) by mouth daily as needed for anxiety.   amLODipine (NORVASC) 5 MG tablet Take 1 tablet (5 mg total) by mouth daily.   ARIPiprazole (ABILIFY) 2 MG tablet Take 1 tablet (2 mg total) by mouth daily.   fluconazole (DIFLUCAN) 150 MG tablet Take 1 tablet (150 mg total) by mouth once for 1 dose.   lamoTRIgine (LAMICTAL) 150 MG tablet Take 2 tablets (300 mg total) by mouth daily.   levocetirizine (XYZAL) 5 MG tablet Take 5 mg by mouth daily. (Patient not taking: Reported on  07/21/2021)   levonorgestrel (MIRENA, 52 MG,) 20 MCG/DAY IUD Mirena 20 mcg/24 hours (8 yrs) 52 mg intrauterine device  Take 1 device every day by intrauterine route.   montelukast (SINGULAIR) 10 MG tablet Take 10 mg by mouth at bedtime. (Patient not taking: Reported on 07/21/2021)   naproxen (NAPROSYN) 500 MG tablet Take 1 tablet (500 mg total) by mouth 2 (two) times daily as needed for moderate pain. (Patient not taking: Reported on 07/21/2021)   nitrofurantoin, macrocrystal-monohydrate, (MACROBID) 100 MG capsule Take 1 capsule (100 mg total) by mouth 2 (two) times daily.   olmesartan-hydrochlorothiazide (BENICAR HCT) 40-12.5 MG tablet Take 1 tablet by mouth daily.   [DISCONTINUED] desvenlafaxine (PRISTIQ) 50 MG 24 hr tablet Take 1 tablet (50 mg total) by mouth daily.   No facility-administered encounter medications on file as of 08/11/2021.    Patient Active Problem List   Diagnosis Date Noted   Family history of breast cancer 06/16/2020   Hyperthyroidism 02/28/2018   Gestational diabetes mellitus 02/28/2018   LGSIL (low grade squamous intraepithelial dysplasia)    SVD (spontaneous vaginal delivery) 10/29/2012   Normal pregnancy, repeat 10/28/2012   Group B streptococcal infection in pregnancy 10/28/2012   Abnormal TSH 01/23/2012   FLATULENCE ERUCTATION AND GAS PAIN 06/30/2010   COSTOCHONDRITIS 10/21/2009   WEIGHT GAIN 10/14/2009   Major depression, chronic 05/08/2009   CONSTIPATION, MILD 06/27/2008   VAGINITIS, BACTERIAL 06/27/2008   GAD (generalized anxiety disorder) 04/04/2008   ALLERGIC RHINITIS 08/31/2007   Essential hypertension 04/27/2007  ACNE NEC 04/27/2007    Past Medical History:  Diagnosis Date   Abnormal Pap smear    Acne    Allergy    Anxiety    Depression    Group B streptococcal infection in pregnancy 10/28/2012   History of chicken pox    Hypertension    Hyperthyroidism    LGSIL (low grade squamous intraepithelial dysplasia)    Dr. Senaida Ores   Normal  pregnancy, repeat 10/28/2012    Relevant past medical, surgical, family and social history reviewed and updated as indicated. Interim medical history since our last visit reviewed.  Review of Systems Per HPI unless specifically indicated above     Objective:    There were no vitals taken for this visit.  Wt Readings from Last 3 Encounters:  07/21/21 172 lb (78 kg)  05/19/21 172 lb (78 kg)  01/20/21 172 lb 6.4 oz (78.2 kg)    Physical Exam Vitals and nursing note reviewed.  Constitutional:      General: [name] not in acute distress.    Appearance: Normal appearance. [name] not ill-appearing, toxic-appearing or diaphoretic.  HENT:     Head: Normocephalic and atraumatic.     Right Ear: External ear normal.     Left Ear: External ear normal.     Nose: Congestion and rhinorrhea present.     Mouth/Throat:     Mouth: Mucous membranes are moist.     Pharynx: Oropharynx is clear.  Eyes:     General: No scleral icterus.       Right eye: No discharge.        Left eye: No discharge.     Extraocular Movements: Extraocular movements intact.  Cardiovascular:     Comments: Unable to assess heart sounds via virtual visit. Pulmonary:     Effort: Pulmonary effort is normal. No respiratory distress.     Comments: Unable to assess breath sounds via virtual visit.  Patient talking in complete sentences during telemedicine visit. Skin:    Coloration: Skin is not jaundiced or pale.     Findings: No erythema.  Neurological:     Mental Status: [name] alert and oriented to person, place, and time.  Psychiatric:        Mood and Affect: Mood normal.        Behavior: Behavior normal.        Thought Content: Thought content normal.        Judgment: Judgment normal.      Assessment & Plan:  1. Acute bacterial sinusitis Acute.  Treat with Augmentin twice daily for 7 days.  Push fluids, continue nasal rinses.  Follow up with no improvement.     - amoxicillin-clavulanate (AUGMENTIN) 875-125 MG  tablet; Take 1 tablet by mouth 2 (two) times daily for 7 days.  Dispense: 14 tablet; Refill: 0  2. Antibiotic-induced yeast infection Prescription given in case yeast infection develops - advised to start treatment AFTER antibiotic complete if symptoms present.   - fluconazole (DIFLUCAN) 150 MG tablet; Take 1 tablet (150 mg total) by mouth once for 1 dose.  Dispense: 1 tablet; Refill: 0     Follow up plan: Return if symptoms worsen or fail to improve.  Due to the catastrophic nature of the COVID-19 pandemic, this video visit was completed soley via audio and visual contact via Caregility due to the restrictions of the COVID-19 pandemic.  All issues as above were discussed and addressed. Physical exam was done as above through visual confirmation  on Caregility. If it was felt that the patient should be evaluated in the office, they were directed there. The patient verbally consented to this visit. Location of the patient: home Location of the provider: work Those involved with this call:  Provider: Cathlean Marseilles, DNP, FNP-C CMA: n/a Front Desk/Registration: Percival Spanish  Time spent on call:  7 minutes with patient face to face via video conference. More than 50% of this time was spent in counseling and coordination of care. 15 minutes total spent in review of patient's record and preparation of their chart. I verified patient identity using two factors (patient name and date of birth). Patient consents verbally to being seen via telemedicine visit today.

## 2021-08-13 ENCOUNTER — Other Ambulatory Visit: Payer: Self-pay | Admitting: Nurse Practitioner

## 2021-08-13 DIAGNOSIS — I10 Essential (primary) hypertension: Secondary | ICD-10-CM

## 2021-08-19 ENCOUNTER — Encounter: Payer: 59 | Admitting: Family Medicine

## 2021-08-19 NOTE — Progress Notes (Signed)
Subjective:    Patient ID: Amanda Strong, female    DOB: 1981-04-16, 40 y.o.   MRN: [id number]  HPI  Past Medical History:  Diagnosis Date   Abnormal Pap smear    Acne    Allergy    Anxiety    Depression    Group B streptococcal infection in pregnancy 10/28/2012   History of chicken pox    Hypertension    Hyperthyroidism    LGSIL (low grade squamous intraepithelial dysplasia)    Dr. Senaida Ores   Normal pregnancy, repeat 10/28/2012   Past Surgical History:  Procedure Laterality Date   BUNIONECTOMY     FOOT SURGERY     WISDOM TOOTH EXTRACTION     Current Outpatient Medications on File Prior to Visit  Medication Sig Dispense Refill   ALPRAZolam (XANAX) 0.5 MG tablet Take 1 tablet (0.5 mg total) by mouth daily as needed for anxiety. 30 tablet 0   amLODipine (NORVASC) 5 MG tablet Take 1 tablet (5 mg total) by mouth daily. 30 tablet 1   ARIPiprazole (ABILIFY) 2 MG tablet Take 1 tablet (2 mg total) by mouth daily. 90 tablet 0   lamoTRIgine (LAMICTAL) 150 MG tablet Take 2 tablets (300 mg total) by mouth daily. 180 tablet 0   levocetirizine (XYZAL) 5 MG tablet Take 5 mg by mouth daily. (Patient not taking: Reported on 07/21/2021)  3   levonorgestrel (MIRENA, 52 MG,) 20 MCG/DAY IUD Mirena 20 mcg/24 hours (8 yrs) 52 mg intrauterine device  Take 1 device every day by intrauterine route.     montelukast (SINGULAIR) 10 MG tablet Take 10 mg by mouth at bedtime. (Patient not taking: Reported on 07/21/2021)     naproxen (NAPROSYN) 500 MG tablet Take 1 tablet (500 mg total) by mouth 2 (two) times daily as needed for moderate pain. (Patient not taking: Reported on 07/21/2021) 15 tablet 0   nitrofurantoin, macrocrystal-monohydrate, (MACROBID) 100 MG capsule Take 1 capsule (100 mg total) by mouth 2 (two) times daily. 10 capsule 0   olmesartan-hydrochlorothiazide (BENICAR HCT) 40-12.5 MG tablet Take 1 tablet by mouth daily. 90 tablet 1   olmesartan-hydrochlorothiazide (BENICAR HCT) 40-25 MG tablet  TAKE 1 TABLET BY MOUTH EVERY DAY 30 tablet 1   [DISCONTINUED] desvenlafaxine (PRISTIQ) 50 MG 24 hr tablet Take 1 tablet (50 mg total) by mouth daily. 21 tablet 0   No current facility-administered medications on file prior to visit.   Allergies  Allergen Reactions   Cephalexin     REACTION: intense itching all over   Percocet [Oxycodone-Acetaminophen] Itching   Social History   Socioeconomic History   Marital status: Single    Spouse name: Not on file   Number of children: 1   Years of education: 16   Highest education level: Not on file  Occupational History   Occupation: WOUND CARE NURSES    Employer: [employer]  Tobacco Use   Smoking status: Former    Types: Cigarettes    Quit date: 10/22/2008    Years since quitting: 12.8   Smokeless tobacco: Never  Vaping Use   Vaping Use: Never used  Substance and Sexual Activity   Alcohol use: No    Alcohol/week: 0.0 standard drinks    Comment: occasionally   Drug use: No   Sexual activity: Yes    Partners: Male    Birth control/protection: Condom, I.U.D.  Other Topics Concern   Not on file  Social History Narrative   Caffeine Use-yes  Regular exercise-no      Entered 03/2014:   Single Mom of 2 children   Children are:      Girl--18 months old      Boy--40 years old   She works at [employer].    Currently works with wound care   Social Determinants of Health   Financial Resource Strain: Not on file  Food Insecurity: Not on file  Transportation Needs: Not on file  Physical Activity: Not on file  Stress: Not on file  Social Connections: Not on file  Intimate Partner Violence: Not on file   Family History  Problem Relation Age of Onset   Hypertension Mother    Cancer Mother        breast   Hypertension Father    Arthritis Father    Hypertension Maternal Grandmother    Arthritis Maternal Grandmother    Hyperlipidemia Maternal Grandmother    Stroke Maternal Grandfather    Alcohol abuse  Maternal Grandfather    Arthritis Maternal Grandfather    Hypertension Paternal Grandmother    Heart disease Paternal Grandmother    Hypertension Paternal Grandfather    Hypertension Maternal Aunt    Hypertension Maternal Uncle    Hypertension Paternal Aunt    Hypertension Paternal Uncle       Review of Systems  All other systems reviewed and are negative.     Objective:   Physical Exam Vitals reviewed.  Constitutional:      General: [name] not in acute distress.    Appearance: [name] well-developed. [name] not diaphoretic.  HENT:     Head: Normocephalic and atraumatic.     Right Ear: External ear normal.     Left Ear: External ear normal.     Nose: Nose normal.     Mouth/Throat:     Pharynx: No oropharyngeal exudate.  Eyes:     General: No scleral icterus.       Right eye: No discharge.        Left eye: No discharge.     Conjunctiva/sclera: Conjunctivae normal.     Pupils: Pupils are equal, round, and reactive to light.  Neck:     Thyroid: No thyromegaly.     Vascular: No JVD.     Trachea: No tracheal deviation.  Cardiovascular:     Rate and Rhythm: Normal rate and regular rhythm.     Heart sounds: Normal heart sounds. No murmur heard.   No friction rub. No gallop.  Pulmonary:     Effort: Pulmonary effort is normal. No respiratory distress.     Breath sounds: Normal breath sounds. No stridor. No wheezing or rales.  Chest:     Chest wall: No tenderness.  Abdominal:     General: Bowel sounds are normal. There is no distension.     Palpations: Abdomen is soft. There is no mass.     Tenderness: There is no abdominal tenderness. There is no guarding or rebound.  Musculoskeletal:        General: No tenderness or deformity. Normal range of motion.     Cervical back: Normal range of motion and neck supple.  Lymphadenopathy:     Cervical: No cervical adenopathy.  Skin:    General: Skin is warm.     Coloration: Skin is not pale.     Findings: No erythema or rash.   Neurological:     Mental Status: [name] alert and oriented to person, place, and time.  Cranial Nerves: No cranial nerve deficit.     Motor: No abnormal muscle tone.     Coordination: Coordination normal.     Deep Tendon Reflexes: Reflexes are normal and symmetric.  Psychiatric:        Behavior: Behavior normal.        Thought Content: Thought content normal.        Judgment: Judgment normal.          Assessment & Plan:  No diagnosis found. [name] physical exam today is normal.  Her blood pressures acceptable.  Given her recent weight gain, I will check a TSH to rule out hypothyroidism.  Given her history of gestational diabetes, I will also check a CMP to monitor her fasting blood sugar and if elevated will check a hemoglobin A1c.  In addition I will check a CBC, and a fasting lipid panel.  I have encouraged her to follow-up with her gynecologist for Pap smear as well as for her mammogram.  Given her history of severe acne and irregular periods, it may also potentially be due to polycystic ovary syndrome.  However I believe the most likely explanation could be medication and I recommended she discuss this with her psychiatrist as the medicine she is taking for her depression are prone to cause weight gain

## 2021-08-26 ENCOUNTER — Other Ambulatory Visit: Payer: Self-pay | Admitting: Nurse Practitioner

## 2021-08-26 DIAGNOSIS — I10 Essential (primary) hypertension: Secondary | ICD-10-CM

## 2021-08-27 ENCOUNTER — Other Ambulatory Visit (HOSPITAL_COMMUNITY): Payer: Self-pay

## 2021-09-02 ENCOUNTER — Telehealth (HOSPITAL_COMMUNITY): Payer: Self-pay | Admitting: *Deleted

## 2021-09-02 NOTE — Telephone Encounter (Signed)
Pt of Dr. Sheela Stack who called c/o "rash" in her genital area she believes is r/t Lamictal as she has only begun taking it. Pt has stopped the medication however wants to know if she can be started on another mood stabilizer (Bi-Polar !). Pt has tried Seroquel previously but found it too sedating. Pt currently on low dose (2 mg) of Abilify. Pt will be seeing Dr. Lolly Mustache on 10/14/21. Please review and advise. Thanks.

## 2021-09-03 DIAGNOSIS — Z20828 Contact with and (suspected) exposure to other viral communicable diseases: Secondary | ICD-10-CM | POA: Diagnosis not present

## 2021-09-03 DIAGNOSIS — Z20822 Contact with and (suspected) exposure to covid-19: Secondary | ICD-10-CM | POA: Diagnosis not present

## 2021-09-03 NOTE — Telephone Encounter (Signed)
I tried to call her, MB is full. I would suggest increasing Abilify to 5 mg. Please try to get ahold of her and see if she wants to try this

## 2021-09-03 NOTE — Telephone Encounter (Signed)
I did reach out to pt but got message that VM is full. Will try again before we close today at 1200.

## 2021-09-06 NOTE — Telephone Encounter (Signed)
I have tried twice more to contact pt, both times call went to VM and MB is full so unable to leave message for pt.

## 2021-09-06 NOTE — Telephone Encounter (Signed)
Okay, she'll have to call back if she still needs help

## 2021-09-06 NOTE — Telephone Encounter (Signed)
okay

## 2021-09-09 ENCOUNTER — Other Ambulatory Visit: Payer: Self-pay | Admitting: Nurse Practitioner

## 2021-09-09 DIAGNOSIS — I10 Essential (primary) hypertension: Secondary | ICD-10-CM

## 2021-09-17 DIAGNOSIS — Z0100 Encounter for examination of eyes and vision without abnormal findings: Secondary | ICD-10-CM | POA: Diagnosis not present

## 2021-09-22 ENCOUNTER — Telehealth (HOSPITAL_COMMUNITY): Payer: Self-pay

## 2021-09-22 DIAGNOSIS — F419 Anxiety disorder, unspecified: Secondary | ICD-10-CM

## 2021-09-22 NOTE — Telephone Encounter (Signed)
Patient called requesting a refill on her Alprazolam (Xanax) 0.5mg . [facility] [address] in [place]. Patient also stated that she stopped her Lamotrigine (lamictal) 150mg  due to severe itching. Patient has a followup appointment scheduled for 10/14/21. Please review and advise. Thank you

## 2021-09-23 MED ORDER — ALPRAZOLAM 0.5 MG PO TABS: 0.5000 mg | ORAL_TABLET | Freq: Every day | ORAL | 0 refills | Status: DC | PRN

## 2021-09-23 NOTE — Telephone Encounter (Signed)
Done.  Please call her to remind that she should try increasing Abilify 5 mg. We are trying to contact her to deliver this message.

## 2021-09-24 ENCOUNTER — Other Ambulatory Visit (HOSPITAL_COMMUNITY): Payer: Self-pay

## 2021-09-24 DIAGNOSIS — F319 Bipolar disorder, unspecified: Secondary | ICD-10-CM

## 2021-09-24 MED ORDER — ARIPIPRAZOLE 5 MG PO TABS
5.0000 mg | ORAL_TABLET | Freq: Every day | ORAL | 2 refills | Status: DC
  Filled 2021-09-24: qty 30, 30d supply, fill #0

## 2021-09-24 NOTE — Telephone Encounter (Signed)
SPOKE WITH PATIENT AND RELAYED MESSAGE

## 2021-10-05 ENCOUNTER — Other Ambulatory Visit (HOSPITAL_COMMUNITY): Payer: Self-pay

## 2021-10-14 ENCOUNTER — Other Ambulatory Visit (HOSPITAL_COMMUNITY): Payer: Self-pay

## 2021-10-14 ENCOUNTER — Telehealth (HOSPITAL_BASED_OUTPATIENT_CLINIC_OR_DEPARTMENT_OTHER): Payer: 59 | Admitting: Psychiatry

## 2021-10-14 ENCOUNTER — Encounter (HOSPITAL_COMMUNITY): Payer: Self-pay | Admitting: Psychiatry

## 2021-10-14 ENCOUNTER — Other Ambulatory Visit: Payer: Self-pay

## 2021-10-14 VITALS — Wt 174.0 lb

## 2021-10-14 DIAGNOSIS — F319 Bipolar disorder, unspecified: Secondary | ICD-10-CM

## 2021-10-14 DIAGNOSIS — F419 Anxiety disorder, unspecified: Secondary | ICD-10-CM | POA: Diagnosis not present

## 2021-10-14 MED ORDER — VORTIOXETINE HBR 5 MG PO TABS: 5.0000 mg | ORAL_TABLET | Freq: Every day | ORAL | 0 refills | Status: DC

## 2021-10-14 MED ORDER — ARIPIPRAZOLE 5 MG PO TABS: 5.0000 mg | ORAL_TABLET | Freq: Every day | ORAL | 2 refills | Status: DC

## 2021-10-14 NOTE — Progress Notes (Signed)
Virtual Visit via Video Note  I connected with Amanda Strong on 10/14/21 at  8:20 AM EST by a video enabled telemedicine application and verified that I am speaking with the correct person using two identifiers.  Location: Patient: Home Provider: Home Office   I discussed the limitations of evaluation and management by telemedicine and the availability of in person appointments. The patient expressed understanding and agreed to proceed.  History of Present Illness: Patient is evaluated by video session.  She stopped taking Lamictal after complaining of itching in her private parts.  She has been taking Lamictal for a while but lately she noticed effects and since she stopped the Lamictal her itching is resolved.  [name] now taking increased dose of Abilify 5 mg.  [name] feeling better but is still have moments of emotion, crying spells, racing thoughts and irritability.  Patient reported that these episodes are not as intense about does exist.  She also feels very anxious and nervous.  [name] taking Xanax some days when she cannot sleep and her anxiety is very high.  She denies any paranoia or any suicidal thoughts.  She denies any hallucination.  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  She has no tremor or shakes.  Her appetite is okay.  She gained 2 pounds since the last visit.  She was working on Christmas night.  She sleeps 5 to 6 hours and sometimes does not sleep all night.    Past Psychiatric History:  H/O mood swing, anger, depression, impulsive behavior, throwing things, damaging and punching walls, speeding tickets and THC use. H/O passive suicidal thoughts, emotional and verbal abuse. No h/o mania, psychosis, inpatient or suicidal attempt. Saw Valinda Hoar and tried Klonopin, Cymbalta, Zoloft, Lexapro, Prestiq, hydroxyzine, prozac and Seroquel (Too sleepy).    Psychiatric Specialty Exam: Physical  Exam  Review of Systems  Weight 174 lb (78.9 kg).Body mass index is 27.25 kg/m.  General Appearance: Casual  Eye Contact:  Good  Speech:  Slow  Volume:  Normal  Mood:  Anxious and Dysphoric  Affect:  Congruent  Thought Process:  Descriptions of Associations: Intact  Orientation:  Full (Time, Place, and Person)  Thought Content:  Rumination  Suicidal Thoughts:  No  Homicidal Thoughts:  No  Memory:  Immediate;   Good Recent;   Good Remote;   Good  Judgement:  Intact  Insight:  Present  Psychomotor Activity:  Normal  Concentration:  Concentration: Good and Attention Span: Good  Recall:  Good  Fund of Knowledge:  Good  Language:  Good  Akathisia:  No  Handed:  Right  AIMS (if indicated):     Assets:  Communication Skills Desire for Improvement Housing Talents/Skills Transportation  ADL's:  Intact  Cognition:  WNL  Sleep:   5-6 hrs    Assessment and Plan: Bipolar disorder type I.  Anxiety.  Patient is no longer taking Lamictal due to itching.  She still have residual anxiety and irritability.  [name] taking higher dose of Abilify but do not feel it helped as much or anxiety.  Trying low-dose Trintellix which she has never tried before.  In the past she had tried Zoloft, Cymbalta, Prozac, Lexapro, Pristiq and hydroxyzine with limited outcome.  We also discussed to do Genesight testing.  Patient agreed with the plan.  We will start Trintellix 5 mg samples and she will come  today to pick up from our office.  She will also do the testing.  Recommend call us back if she has any question, concern or if she feels worsening of the symptoms.  Follow up in 6 weeks.  Follow Up Instructions:    I discussed the assessment and treatment plan with the patient. The patient was provided an opportunity to ask questions and all were answered. The patient agreed with the plan and demonstrated an understanding of the instructions.   The patient was advised to call back or seek an in-person  evaluation if the symptoms worsen or if the condition fails to improve as anticipated.  I provided 30 minutes of non-face-to-face time during this encounter.   Kathlee Nations, MD

## 2021-11-05 ENCOUNTER — Other Ambulatory Visit (HOSPITAL_COMMUNITY): Payer: Self-pay | Admitting: Psychiatry

## 2021-11-05 DIAGNOSIS — F319 Bipolar disorder, unspecified: Secondary | ICD-10-CM

## 2021-11-11 ENCOUNTER — Telehealth (HOSPITAL_COMMUNITY): Payer: Self-pay | Admitting: *Deleted

## 2021-11-11 ENCOUNTER — Telehealth (HOSPITAL_COMMUNITY): Payer: 59 | Admitting: Psychiatry

## 2021-11-11 ENCOUNTER — Encounter (HOSPITAL_COMMUNITY): Payer: Self-pay | Admitting: Psychiatry

## 2021-11-11 ENCOUNTER — Telehealth (HOSPITAL_BASED_OUTPATIENT_CLINIC_OR_DEPARTMENT_OTHER): Payer: 59 | Admitting: Psychiatry

## 2021-11-11 ENCOUNTER — Other Ambulatory Visit: Payer: Self-pay

## 2021-11-11 VITALS — Wt 174.0 lb

## 2021-11-11 DIAGNOSIS — F319 Bipolar disorder, unspecified: Secondary | ICD-10-CM

## 2021-11-11 DIAGNOSIS — F419 Anxiety disorder, unspecified: Secondary | ICD-10-CM

## 2021-11-11 MED ORDER — ALPRAZOLAM 0.5 MG PO TABS: 0.5000 mg | ORAL_TABLET | Freq: Every day | ORAL | 0 refills | Status: DC | PRN

## 2021-11-11 MED ORDER — VORTIOXETINE HBR 10 MG PO TABS: 10.0000 mg | ORAL_TABLET | Freq: Every day | ORAL | 1 refills | Status: DC

## 2021-11-11 MED ORDER — ARIPIPRAZOLE 5 MG PO TABS: 5.0000 mg | ORAL_TABLET | Freq: Every day | ORAL | 1 refills | Status: DC

## 2021-11-11 NOTE — Progress Notes (Signed)
Virtual Visit via Video Note  I connected with Amanda Strong on 11/11/21 at  2:00 PM EST by a video enabled telemedicine application and verified that I am speaking with the correct person using two identifiers.  Location: Patient: Home Provider: Office   I discussed the limitations of evaluation and management by telemedicine and the availability of in person appointments. The patient expressed understanding and agreed to proceed.  History of Present Illness: Patient is evaluated by video session.  We started her on Trintellix on the last visit.  [name] on 5 mg.  [name] no longer taking Lamictal due to itching.  She noticed marginal improvement as no more crying spells or any suicidal thoughts but still feels anxious and nervous.  She still required sometimes Xanax that help her sleep and anxiety.  [name] taking Abilify 5 mg is helping her mood swing, anger, highs and lows and mania.  So far she has no issues with the medication and denies any tremor or shakes or any EPS.  She like to give more time and higher dose of Trintellix.  We did Genesight testing and Trintellix is favorable.  Like to get the results of her testing.  Past Psychiatric History:  H/O mood swing, anger, depression, impulsive behavior, throwing things, damaging and punching walls, speeding tickets and THC use. H/O passive suicidal thoughts, emotional and verbal abuse. No h/o mania, psychosis, inpatient or suicidal attempt. Saw Gala Murdoch and tried Klonopin, Cymbalta, Zoloft, Lexapro, Prestiq, hydroxyzine, prozac and Seroquel (Too sleepy).     Psychiatric Specialty Exam: Physical Exam  Review of Systems  Weight 174 lb (78.9 kg).There is no height or weight on file to calculate BMI.  General Appearance: Casual  Eye Contact:  Good  Speech:  Clear and Coherent  Volume:  Normal  Mood:  Anxious  Affect:  Appropriate  Thought Process:  Goal Directed  Orientation:  Full (Time, Place, and Person)  Thought Content:   Rumination  Suicidal Thoughts:  No  Homicidal Thoughts:  No  Memory:  Immediate;   Good Recent;   Good Remote;   Good  Judgement:  Intact  Insight:  Present  Psychomotor Activity:  Normal  Concentration:  Concentration: Good and Attention Span: Good  Recall:  Good  Fund of Knowledge:  Good  Language:  Good  Akathisia:  No  Handed:  Right  AIMS (if indicated):     Assets:  Communication Skills Desire for [place] Talents/Skills Transportation  ADL's:  Intact  Cognition:  WNL  Sleep:   better      Assessment and Plan: Bipolar disorder type I.  Anxiety.  Recommend to try Trintellix 10 mg as patient reported no side effects.  She still have residual anxiety but denies any crying spells or any suicidal thoughts.  Continue Abilify 5 mg daily.  I review Genesight testing results.  We will provide a copy and she can come to pick up from our office.  Recommended to call us back if she is any question or any concern.  Follow-up in 2 months.  She does require a new prescription of Xanax 0.5 mg which he takes as needed and Abilify 5 mg daily.  Follow Up Instructions:    I discussed the assessment and treatment plan with the patient. The patient was provided an opportunity to ask questions and all were answered. The patient agreed with the plan and demonstrated an understanding of the instructions.   The patient was advised  to call back or seek an in-person evaluation if the symptoms worsen or if the condition fails to improve as anticipated.  I provided 21 minutes of non-face-to-face time during this encounter.   Kathlee Nations, MD

## 2021-11-11 NOTE — Telephone Encounter (Signed)
Writer spoke with pt regarding GeneSight test results as Dr. Lolly Mustache has reviewed. Pt advised that Trintellix 10 mg tabs qd Rx has been sent to pt pharmacy. Pt also advised that she may pick up test results at this office. Pt verbalizes understanding.

## 2021-11-15 ENCOUNTER — Encounter: Payer: Self-pay | Admitting: Family Medicine

## 2021-11-16 ENCOUNTER — Telehealth (HOSPITAL_COMMUNITY): Payer: Self-pay | Admitting: *Deleted

## 2021-11-16 ENCOUNTER — Other Ambulatory Visit (HOSPITAL_COMMUNITY): Payer: Self-pay | Admitting: *Deleted

## 2021-11-16 MED ORDER — PAROXETINE HCL 10 MG PO TABS: 10.0000 mg | ORAL_TABLET | Freq: Every day | ORAL | 1 refills | Status: DC

## 2021-11-16 NOTE — Telephone Encounter (Signed)
She can try Paxil 10 mg daily.

## 2021-11-16 NOTE — Telephone Encounter (Signed)
Ok. Will send order if pt agrees

## 2021-11-16 NOTE — Telephone Encounter (Signed)
Pt called regarding the cost of the Trintellix 10 mg which she says is $138 per #30. Does not look like pt will qualify for Pt. Assistance and can't pay that price for #30 supply. Please review and advise.

## 2021-12-11 ENCOUNTER — Other Ambulatory Visit (HOSPITAL_COMMUNITY): Payer: Self-pay | Admitting: Psychiatry

## 2022-01-10 ENCOUNTER — Encounter (HOSPITAL_COMMUNITY): Payer: Self-pay | Admitting: Psychiatry

## 2022-01-10 ENCOUNTER — Telehealth (HOSPITAL_BASED_OUTPATIENT_CLINIC_OR_DEPARTMENT_OTHER): Payer: 59 | Admitting: Psychiatry

## 2022-01-10 VITALS — Wt 172.0 lb

## 2022-01-10 DIAGNOSIS — F419 Anxiety disorder, unspecified: Secondary | ICD-10-CM

## 2022-01-10 DIAGNOSIS — F319 Bipolar disorder, unspecified: Secondary | ICD-10-CM

## 2022-01-10 MED ORDER — ARIPIPRAZOLE 5 MG PO TABS: 5.0000 mg | ORAL_TABLET | Freq: Every day | ORAL | 1 refills | Status: DC

## 2022-01-10 MED ORDER — PAROXETINE HCL 20 MG PO TABS: 20.0000 mg | ORAL_TABLET | Freq: Every day | ORAL | 1 refills | Status: DC

## 2022-01-10 NOTE — Progress Notes (Signed)
Virtual Visit via Telephone Note ? ?I connected with [name] on 01/10/22 at  2:20 PM EDT by telephone and verified that I am speaking with the correct person using two identifiers. ? ?Location: ?Patient: In Car ?Provider: Home Office ?  ?I discussed the limitations, risks, security and privacy concerns of performing an evaluation and management service by telephone and the availability of in person appointments. I also discussed with the patient that there may be a patient responsible charge related to this service. The patient expressed understanding and agreed to proceed. ? ? ?History of Present Illness: ?Patient is evaluated by phone session.  She could not afford Trintellix and now taking Paxil 10 mg.  She feels the Paxil is not working as she continued to get anxiety, nervousness.  [name] not sure what triggered these thoughts but admitted irritability and anxiety.  She works night shift.  She has no tremor or shakes or any EPS.  We received results of the testing and I discussed with her about the medication interaction.  We discussed Cymbalta, Luvox, mirtazapine should not be taken and Wellbutrin should be taken with cautious.  Her current medicine is okay.  I recommend try going up on Paxil 20 mg.  She has Xanax however has not taken recently.  [name] compliant with Abilify.  She has no concern from the Abilify.  ? ?Past Psychiatric History:  ?H/O mood swing, anger, depression, impulsive behavior, throwing things, damaging and punching walls, speeding tickets and THC use. H/O passive suicidal thoughts, emotional and verbal abuse. No h/o mania, psychosis, inpatient or suicidal attempt. Saw Valinda Hoar and tried Klonopin, Cymbalta, Zoloft, Lexapro, Prestiq, hydroxyzine, prozac and Seroquel (Too sleepy).   ? ?Psychiatric Specialty Exam: ?Physical Exam  ?Review of Systems  ?Weight 172 lb (78 kg).There is no height or weight on file to calculate BMI.  ?General Appearance: NA  ?Eye Contact:  NA   ?Speech:  Clear and Coherent  ?Volume:  Normal  ?Mood:  Dysphoric and Irritable  ?Affect:  NA  ?Thought Process:  Goal Directed  ?Orientation:  Full (Time, Place, and Person)  ?Thought Content:  Rumination  ?Suicidal Thoughts:  No  ?Homicidal Thoughts:  No  ?Memory:  Immediate;   Good ?Recent;   Good ?Remote;   Good  ?Judgement:  Fair  ?Insight:  Present  ?Psychomotor Activity:  NA  ?Concentration:  Concentration: Good and Attention Span: Good  ?Recall:  Good  ?Fund of Knowledge:  Good  ?Language:  Good  ?Akathisia:  No  ?Handed:  Right  ?AIMS (if indicated):     ?Assets:  Communication Skills ?Desire for Improvement ?Housing ?Resilience ?Social Support ?Talents/Skills  ?ADL's:  Intact  ?Cognition:  WNL  ?Sleep:   6 hrs  ? ? ? ? ?Assessment and Plan: ?Bipolar disorder type I.  Anxiety. ? ?Patient is taking Paxil 10 mg.  Recommend to try 20 mg since she has not seen any improvement but also does not have any side effects.  She cannot afford Trintellix.  Continue Abilify 5 mg daily.  I review genepsch testing results with her.  Recommended to call us back if she has any question, concern or if she feels worsening of the symptoms.  Follow up in 2 months. ? ?Follow Up Instructions: ? ?  ?I discussed the assessment and treatment plan with the patient. The patient was provided an opportunity to ask questions and all were answered. The patient agreed with the plan and demonstrated an understanding of the  instructions. ?  ?The patient was advised to call back or seek an in-person evaluation if the symptoms worsen or if the condition fails to improve as anticipated. ? ?Collaboration of Care: Primary Care Provider AEB notes are available in epic to review.  ? ?Patient/Guardian was advised Release of Information must be obtained prior to any record release in order to collaborate their care with an outside provider. Patient/Guardian was advised if they have not already done so to contact the registration department to sign  all necessary forms in order for [place] to release information regarding their care.  ? ?Consent: Patient/Guardian gives verbal consent for treatment and assignment of benefits for services provided during this visit. Patient/Guardian expressed understanding and agreed to proceed.   ? ?I provided 22 minutes of non-face-to-face time during this encounter. ? ? ?Cleotis Nipper, MD  ?

## 2022-01-17 ENCOUNTER — Telehealth (HOSPITAL_COMMUNITY): Payer: Self-pay | Admitting: *Deleted

## 2022-01-17 DIAGNOSIS — F419 Anxiety disorder, unspecified: Secondary | ICD-10-CM

## 2022-01-17 NOTE — Telephone Encounter (Signed)
Pt called to request refill of Xanax 0.5 mg. Last script sent in on 11/11/21 #30 with no fills. Pt has an upcoming appointment on 03/11/22. Please review. ?

## 2022-01-18 MED ORDER — ALPRAZOLAM 0.5 MG PO TABS: 0.5000 mg | ORAL_TABLET | Freq: Every day | ORAL | 0 refills | Status: DC | PRN

## 2022-01-18 NOTE — Telephone Encounter (Signed)
Send to [facility].  ?

## 2022-01-26 ENCOUNTER — Other Ambulatory Visit (HOSPITAL_COMMUNITY): Payer: Self-pay | Admitting: *Deleted

## 2022-01-26 ENCOUNTER — Telehealth (HOSPITAL_COMMUNITY): Payer: Self-pay | Admitting: *Deleted

## 2022-01-26 DIAGNOSIS — F419 Anxiety disorder, unspecified: Secondary | ICD-10-CM

## 2022-01-26 DIAGNOSIS — F319 Bipolar disorder, unspecified: Secondary | ICD-10-CM

## 2022-01-26 MED ORDER — PAROXETINE HCL 30 MG PO TABS: 30.0000 mg | ORAL_TABLET | Freq: Every day | ORAL | 1 refills | Status: DC

## 2022-01-26 NOTE — Telephone Encounter (Signed)
She can try Paxil 30 mg. If agree than please call pharmacy.  ?

## 2022-01-26 NOTE — Telephone Encounter (Signed)
Writer returned pt VM indicating that despite increasing Paxil to 20 mg she is not doing well. Pt states that her anxiety, depression, and "bad thoughts" have increased dramatically. Pt does endorse si but denies plan. Information provided about [facility] or nearest ED if needed. Pt denies that she needs that but acknowledges she's never felt this intensely depressed, anxious, and irritable. 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 Pt did make an appointment with a therapist on 02/05/22. Her next appointment with you is on 03/14/22. I will ask front desk to see if there are any earlier appointments. Please review and advise.  ?

## 2022-01-26 NOTE — Telephone Encounter (Signed)
Pt agrees. Rx sent to [facility]. Pt encouraged to call to update [place]. Pt agrees.

## 2022-01-28 ENCOUNTER — Telehealth (HOSPITAL_COMMUNITY): Payer: 59 | Admitting: Psychiatry

## 2022-02-02 ENCOUNTER — Other Ambulatory Visit (HOSPITAL_COMMUNITY): Payer: Self-pay | Admitting: Psychiatry

## 2022-02-05 DIAGNOSIS — F331 Major depressive disorder, recurrent, moderate: Secondary | ICD-10-CM | POA: Diagnosis not present

## 2022-02-18 ENCOUNTER — Other Ambulatory Visit (HOSPITAL_COMMUNITY): Payer: Self-pay

## 2022-03-02 ENCOUNTER — Other Ambulatory Visit (HOSPITAL_COMMUNITY): Payer: Self-pay | Admitting: Psychiatry

## 2022-03-02 DIAGNOSIS — F419 Anxiety disorder, unspecified: Secondary | ICD-10-CM

## 2022-03-02 DIAGNOSIS — F319 Bipolar disorder, unspecified: Secondary | ICD-10-CM

## 2022-03-04 ENCOUNTER — Ambulatory Visit: Payer: 59 | Admitting: Family Medicine

## 2022-03-04 ENCOUNTER — Other Ambulatory Visit (HOSPITAL_COMMUNITY): Payer: Self-pay

## 2022-03-04 VITALS — BP 162/118 | HR 74 | Temp 98.3°F | Ht 67.0 in | Wt 168.3 lb

## 2022-03-04 DIAGNOSIS — I10 Essential (primary) hypertension: Secondary | ICD-10-CM | POA: Diagnosis not present

## 2022-03-04 MED ORDER — VALSARTAN 320 MG PO TABS
320.0000 mg | ORAL_TABLET | Freq: Every day | ORAL | 3 refills | Status: DC
  Filled 2022-03-04: qty 90, 90d supply, fill #0
  Filled 2022-06-18: qty 90, 90d supply, fill #1
  Filled 2022-09-21: qty 90, 90d supply, fill #2

## 2022-03-04 NOTE — Progress Notes (Signed)
Subjective:    Patient ID: Amanda Strong, female    DOB: 1981/06/07, 41 y.o.   MRN: [id number]  Hypertension  Patient is a very pleasant 41 year old female here today to discuss her blood pressure.  She stopped her Benicar HCTZ because the medicine made her feel "foggy".  She took it for a while and tried to adjust to it but the medicine never settled well in her system.  [name] off all medication regarding her blood pressure pills.  Her blood pressure is consistently running high with systolic blood pressures between 160 and 180.  She has been having daily headaches.  She denies any chest pain or shortness of breath.  She denies any possibility of pregnancy.  [name] not eating a ton of salt.  [name] not taking any type of stimulant although she does drink caffeine/Pepsi Past Medical History:  Diagnosis Date   Abnormal Pap smear    Acne    Allergy    Anxiety    Depression    Group B streptococcal infection in pregnancy 10/28/2012   History of chicken pox    Hypertension    Hyperthyroidism    LGSIL (low grade squamous intraepithelial dysplasia)    Dr. Senaida Ores   Normal pregnancy, repeat 10/28/2012   Past Surgical History:  Procedure Laterality Date   BUNIONECTOMY     FOOT SURGERY     WISDOM TOOTH EXTRACTION     Current Outpatient Medications on File Prior to Visit  Medication Sig Dispense Refill   ALPRAZolam (XANAX) 0.5 MG tablet Take 1 tablet (0.5 mg total) by mouth daily as needed for anxiety. 30 tablet 0   levonorgestrel (MIRENA, 52 MG,) 20 MCG/DAY IUD Mirena 20 mcg/24 hours (8 yrs) 52 mg intrauterine device  Take 1 device every day by intrauterine route.     olmesartan-hydrochlorothiazide (BENICAR HCT) 40-12.5 MG tablet Take 1 tablet by mouth daily. 90 tablet 1   PARoxetine (PAXIL) 30 MG tablet Take 1 tablet (30 mg total) by mouth daily. 30 tablet 1   ARIPiprazole (ABILIFY) 5 MG tablet Take 1 tablet (5 mg total) by mouth daily. (Patient not taking: Reported on 03/04/2022) 30  tablet 1   [DISCONTINUED] desvenlafaxine (PRISTIQ) 50 MG 24 hr tablet Take 1 tablet (50 mg total) by mouth daily. 21 tablet 0   No current facility-administered medications on file prior to visit.   Allergies  Allergen Reactions   Cephalexin     REACTION: intense itching all over   Percocet [Oxycodone-Acetaminophen] Itching   Social History   Socioeconomic History   Marital status: Single    Spouse name: Not on file   Number of children: 1   Years of education: 16   Highest education level: Not on file  Occupational History   Occupation: WOUND CARE NURSES    Employer: [employer]  Tobacco Use   Smoking status: Former    Types: Cigarettes    Quit date: 10/22/2008    Years since quitting: 13.3   Smokeless tobacco: Never  Vaping Use   Vaping Use: Never used  Substance and Sexual Activity   Alcohol use: No    Alcohol/week: 0.0 standard drinks of alcohol    Comment: occasionally   Drug use: No   Sexual activity: Yes    Partners: Male    Birth control/protection: Condom, I.U.D.  Other Topics Concern   Not on file  Social History Narrative   Caffeine Use-yes   Regular exercise-no  Entered 03/2014:   Single Mom of 2 children   Children are:      Girl--18 months old      Boy--41 years old   She works at [employer].    Currently works with wound care   Social Determinants of Health   Financial Resource Strain: Not on file  Food Insecurity: Not on file  Transportation Needs: Not on file  Physical Activity: Not on file  Stress: Not on file  Social Connections: Not on file  Intimate Partner Violence: Not on file      Review of Systems  All other systems reviewed and are negative.     Objective:   Physical Exam Vitals reviewed.  Constitutional:      General: [name] not in acute distress.    Appearance: Normal appearance. [name] not ill-appearing or toxic-appearing.  Cardiovascular:     Rate and Rhythm: Normal rate and regular  rhythm.     Pulses: Normal pulses.     Heart sounds: Normal heart sounds. No murmur heard.    No friction rub. No gallop.  Pulmonary:     Effort: Pulmonary effort is normal. No respiratory distress.     Breath sounds: No stridor. No wheezing, rhonchi or rales.  Musculoskeletal:     Right lower leg: No edema.     Left lower leg: No edema.  Neurological:     Mental Status: [name] alert.         Assessment & Plan:   Essential hypertension - Plan: CBC with Differential/Platelet, COMPLETE METABOLIC PANEL WITH GFR We discussed options and I recommended trying valsartan 320 mg daily.  Allow 2 weeks for her body to adjust to the new medication and then recheck her blood pressure.  If her blood pressure is consistently high at that point, we may need to add amlodipine.  She wants to avoid diuretics.  Check BMP along with CBC today for a baseline.  If there is elevated creatinine, evaluate for renal artery stenosis.  If her potassium is low can consider hyperaldosteronism

## 2022-03-04 NOTE — Addendum Note (Signed)
Addended by: Lynnea Ferrier T on: 03/04/2022 09:34 AM   Modules accepted: Orders

## 2022-03-05 LAB — CBC WITH DIFFERENTIAL/PLATELET
Absolute Monocytes: 1035 cells/uL — ABNORMAL HIGH (ref 200–950)
Basophils Absolute: 39 cells/uL (ref 0–200)
Basophils Relative: 0.3 %
Eosinophils Absolute: 105 cells/uL (ref 15–500)
Eosinophils Relative: 0.8 %
HCT: 42.7 % (ref 35.0–45.0)
Hemoglobin: 14.4 g/dL (ref 11.7–15.5)
Lymphs Abs: 2803 cells/uL (ref 850–3900)
MCH: 30.8 pg (ref 27.0–33.0)
MCHC: 33.7 g/dL (ref 32.0–36.0)
MCV: 91.4 fL (ref 80.0–100.0)
MPV: 12.3 fL (ref 7.5–12.5)
Monocytes Relative: 7.9 %
Neutro Abs: 9118 cells/uL — ABNORMAL HIGH (ref 1500–7800)
Neutrophils Relative %: 69.6 %
Platelets: 216 10*3/uL (ref 140–400)
RBC: 4.67 10*6/uL (ref 3.80–5.10)
RDW: 12.1 % (ref 11.0–15.0)
Total Lymphocyte: 21.4 %
WBC: 13.1 10*3/uL — ABNORMAL HIGH (ref 3.8–10.8)

## 2022-03-05 LAB — COMPLETE METABOLIC PANEL WITH GFR
AG Ratio: 2 (calc) (ref 1.0–2.5)
ALT: 20 U/L (ref 6–29)
AST: 16 U/L (ref 10–30)
Albumin: 4.8 g/dL (ref 3.6–5.1)
Alkaline phosphatase (APISO): 59 U/L (ref 31–125)
BUN: 14 mg/dL (ref 7–25)
CO2: 25 mmol/L (ref 20–32)
Calcium: 9.7 mg/dL (ref 8.6–10.2)
Chloride: 103 mmol/L (ref 98–110)
Creat: 0.7 mg/dL (ref 0.50–0.99)
Globulin: 2.4 g/dL (calc) (ref 1.9–3.7)
Glucose, Bld: 93 mg/dL (ref 65–99)
Potassium: 3.9 mmol/L (ref 3.5–5.3)
Sodium: 139 mmol/L (ref 135–146)
Total Bilirubin: 0.4 mg/dL (ref 0.2–1.2)
Total Protein: 7.2 g/dL (ref 6.1–8.1)
eGFR: 112 mL/min/{1.73_m2} (ref >=60)

## 2022-03-14 ENCOUNTER — Telehealth (HOSPITAL_BASED_OUTPATIENT_CLINIC_OR_DEPARTMENT_OTHER): Payer: 59 | Admitting: Psychiatry

## 2022-03-14 ENCOUNTER — Other Ambulatory Visit (HOSPITAL_COMMUNITY): Payer: Self-pay

## 2022-03-14 ENCOUNTER — Encounter (HOSPITAL_COMMUNITY): Payer: Self-pay | Admitting: Psychiatry

## 2022-03-14 DIAGNOSIS — F419 Anxiety disorder, unspecified: Secondary | ICD-10-CM | POA: Diagnosis not present

## 2022-03-14 DIAGNOSIS — F319 Bipolar disorder, unspecified: Secondary | ICD-10-CM

## 2022-03-14 MED ORDER — PAROXETINE HCL 30 MG PO TABS
30.0000 mg | ORAL_TABLET | Freq: Every day | ORAL | 0 refills | Status: DC
  Filled 2022-03-14 – 2022-05-16 (×2): qty 90, 90d supply, fill #0

## 2022-03-14 MED ORDER — ARIPIPRAZOLE 5 MG PO TABS: 5.0000 mg | ORAL_TABLET | Freq: Every day | ORAL | 1 refills | Status: DC

## 2022-03-14 MED ORDER — ALPRAZOLAM 0.5 MG PO TABS: 0.5000 mg | ORAL_TABLET | Freq: Every day | ORAL | 0 refills | Status: DC | PRN

## 2022-03-15 ENCOUNTER — Telehealth: Payer: Self-pay

## 2022-03-16 DIAGNOSIS — F331 Major depressive disorder, recurrent, moderate: Secondary | ICD-10-CM | POA: Diagnosis not present

## 2022-03-18 ENCOUNTER — Other Ambulatory Visit: Payer: 59

## 2022-03-18 DIAGNOSIS — I10 Essential (primary) hypertension: Secondary | ICD-10-CM

## 2022-03-18 DIAGNOSIS — E0590 Thyrotoxicosis, unspecified without thyrotoxic crisis or storm: Secondary | ICD-10-CM | POA: Diagnosis not present

## 2022-03-19 LAB — BASIC METABOLIC PANEL
BUN: 14 mg/dL (ref 7–25)
CO2: 24 mmol/L (ref 20–32)
Calcium: 9.5 mg/dL (ref 8.6–10.2)
Chloride: 105 mmol/L (ref 98–110)
Creat: 0.8 mg/dL (ref 0.50–0.99)
Glucose, Bld: 92 mg/dL (ref 65–99)
Potassium: 4.4 mmol/L (ref 3.5–5.3)
Sodium: 137 mmol/L (ref 135–146)

## 2022-03-21 ENCOUNTER — Telehealth: Payer: Self-pay

## 2022-03-21 ENCOUNTER — Encounter: Payer: Self-pay | Admitting: Family Medicine

## 2022-03-21 NOTE — Telephone Encounter (Signed)
This will have to be addressed by Dr. Tanya Nones

## 2022-03-21 NOTE — Telephone Encounter (Signed)
Pt called to see why another BMP was re-drawn when its hr WBC that was high. Please advice

## 2022-03-25 ENCOUNTER — Other Ambulatory Visit (HOSPITAL_COMMUNITY): Payer: Self-pay

## 2022-03-25 NOTE — Telephone Encounter (Signed)
LM for pt to return call so that we can schedule a lab appt for CBC

## 2022-03-28 ENCOUNTER — Other Ambulatory Visit: Payer: 59

## 2022-03-28 ENCOUNTER — Telehealth: Payer: Self-pay

## 2022-03-28 DIAGNOSIS — D72829 Elevated white blood cell count, unspecified: Secondary | ICD-10-CM | POA: Diagnosis not present

## 2022-03-28 NOTE — Telephone Encounter (Signed)
Getting cbc draw due to elevated WBC per Dr. Tanya Nones Today.

## 2022-03-29 LAB — CBC WITH DIFFERENTIAL/PLATELET
Absolute Monocytes: 861 cells/uL (ref 200–950)
Basophils Absolute: 24 cells/uL (ref 0–200)
Basophils Relative: 0.2 %
Eosinophils Absolute: 59 cells/uL (ref 15–500)
Eosinophils Relative: 0.5 %
HCT: 39.5 % (ref 35.0–45.0)
Hemoglobin: 13.4 g/dL (ref 11.7–15.5)
Lymphs Abs: 2525 cells/uL (ref 850–3900)
MCH: 31.3 pg (ref 27.0–33.0)
MCHC: 33.9 g/dL (ref 32.0–36.0)
MCV: 92.3 fL (ref 80.0–100.0)
MPV: 12.2 fL (ref 7.5–12.5)
Monocytes Relative: 7.3 %
Neutro Abs: 8331 cells/uL — ABNORMAL HIGH (ref 1500–7800)
Neutrophils Relative %: 70.6 %
Platelets: 221 10*3/uL (ref 140–400)
RBC: 4.28 10*6/uL (ref 3.80–5.10)
RDW: 12.4 % (ref 11.0–15.0)
Total Lymphocyte: 21.4 %
WBC: 11.8 10*3/uL — ABNORMAL HIGH (ref 3.8–10.8)

## 2022-04-05 ENCOUNTER — Encounter: Payer: 59 | Admitting: Family Medicine

## 2022-04-07 ENCOUNTER — Other Ambulatory Visit (HOSPITAL_COMMUNITY): Payer: Self-pay

## 2022-04-07 MED ORDER — AMLODIPINE BESYLATE 10 MG PO TABS
10.0000 mg | ORAL_TABLET | Freq: Every day | ORAL | 1 refills | Status: DC
  Filled 2022-04-07: qty 90, 90d supply, fill #0

## 2022-04-07 NOTE — Telephone Encounter (Addendum)
Tried calling pt back and LM for pt to return call as soon as possible.   Please advice, see pt's msg  04/07/22 at 5:32pm called pt back, pt is aware of amlodipine 10mg   sent to pharmacy per Dr. .

## 2022-04-08 ENCOUNTER — Other Ambulatory Visit (HOSPITAL_COMMUNITY): Payer: Self-pay

## 2022-04-11 ENCOUNTER — Other Ambulatory Visit (HOSPITAL_COMMUNITY): Payer: Self-pay

## 2022-04-11 ENCOUNTER — Other Ambulatory Visit (HOSPITAL_COMMUNITY): Payer: Self-pay | Admitting: Psychiatry

## 2022-04-11 ENCOUNTER — Telehealth (HOSPITAL_COMMUNITY): Payer: Self-pay | Admitting: *Deleted

## 2022-04-11 DIAGNOSIS — F419 Anxiety disorder, unspecified: Secondary | ICD-10-CM

## 2022-04-11 NOTE — Telephone Encounter (Signed)
Pt called requesting a refill of the Xanax 0.5 mg QD PRN. Last filled at [facility] on 03/14/22 for #30. Pt is now using [facility]. Pt's next appointment is scheduled for 05/16/22. Please review and advise.

## 2022-04-12 ENCOUNTER — Ambulatory Visit: Payer: 59 | Admitting: Family Medicine

## 2022-04-12 ENCOUNTER — Other Ambulatory Visit (HOSPITAL_COMMUNITY): Payer: Self-pay

## 2022-04-12 MED ORDER — ALPRAZOLAM 0.5 MG PO TABS
0.5000 mg | ORAL_TABLET | Freq: Every day | ORAL | 0 refills | Status: DC | PRN
  Filled 2022-04-12: qty 20, 20d supply, fill #0

## 2022-04-12 NOTE — Telephone Encounter (Signed)
The prescription of Xanax 0.5 mg to take as needed #20 sent to the pharmacy.  She should take only for severe anxiety.

## 2022-04-18 ENCOUNTER — Ambulatory Visit: Payer: 59 | Admitting: Family Medicine

## 2022-04-21 DIAGNOSIS — T63391A Toxic effect of venom of other spider, accidental (unintentional), initial encounter: Secondary | ICD-10-CM | POA: Diagnosis not present

## 2022-04-21 DIAGNOSIS — B349 Viral infection, unspecified: Secondary | ICD-10-CM | POA: Diagnosis not present

## 2022-05-02 ENCOUNTER — Ambulatory Visit (INDEPENDENT_AMBULATORY_CARE_PROVIDER_SITE_OTHER): Payer: 59 | Admitting: Family Medicine

## 2022-05-02 VITALS — BP 140/90 | HR 99 | Ht 67.0 in | Wt 173.4 lb

## 2022-05-02 DIAGNOSIS — I10 Essential (primary) hypertension: Secondary | ICD-10-CM | POA: Diagnosis not present

## 2022-05-02 DIAGNOSIS — I158 Other secondary hypertension: Secondary | ICD-10-CM | POA: Diagnosis not present

## 2022-05-02 DIAGNOSIS — D72829 Elevated white blood cell count, unspecified: Secondary | ICD-10-CM

## 2022-05-02 DIAGNOSIS — Z0000 Encounter for general adult medical examination without abnormal findings: Secondary | ICD-10-CM | POA: Diagnosis not present

## 2022-05-02 NOTE — Progress Notes (Signed)
Subjective:    Patient ID: Amanda Strong, female    DOB: 1980/11/18, 41 y.o.   MRN: [id number]  Patient is a very pleasant 41 year old African-American female here today for complete physical exam.  She gets her mammograms and her Pap smear performed at her gynecologist.  Despite being on 2 antihypertensive medications at the maximum dose, her blood pressure remains elevated.  She still seeing systolic blood pressures over 003 at home and diastolic blood pressures over 704.  [name] due for a tetanus shot.  Otherwise her preventative care is up-to-date. Past Medical History:  Diagnosis Date   Abnormal Pap smear    Acne    Allergy    Anxiety    Depression    Group B streptococcal infection in pregnancy 10/28/2012   History of chicken pox    Hypertension    Hyperthyroidism    LGSIL (low grade squamous intraepithelial dysplasia)    Dr. Senaida Ores   Normal pregnancy, repeat 10/28/2012   Past Surgical History:  Procedure Laterality Date   BUNIONECTOMY     FOOT SURGERY     WISDOM TOOTH EXTRACTION     Current Outpatient Medications on File Prior to Visit  Medication Sig Dispense Refill   ALPRAZolam (XANAX) 0.5 MG tablet Take 1 tablet (0.5 mg total) by mouth daily as needed for anxiety. 20 tablet 0   amLODipine (NORVASC) 10 MG tablet Take 1 tablet (10 mg total) by mouth daily. 90 tablet 1   ARIPiprazole (ABILIFY) 5 MG tablet Take 1 tablet (5 mg total) by mouth daily. 30 tablet 1   levonorgestrel (MIRENA, 52 MG,) 20 MCG/DAY IUD Mirena 20 mcg/24 hours (8 yrs) 52 mg intrauterine device  Take 1 device every day by intrauterine route.     PARoxetine (PAXIL) 30 MG tablet Take 1 tablet (30 mg total) by mouth daily. 90 tablet 0   valsartan (DIOVAN) 320 MG tablet Take 1 tablet (320 mg total) by mouth daily. 90 tablet 3   [DISCONTINUED] desvenlafaxine (PRISTIQ) 50 MG 24 hr tablet Take 1 tablet (50 mg total) by mouth daily. 21 tablet 0   No current facility-administered medications on file prior  to visit.   Allergies  Allergen Reactions   Cephalexin     REACTION: intense itching all over   Percocet [Oxycodone-Acetaminophen] Itching   Social History   Socioeconomic History   Marital status: Single    Spouse name: Not on file   Number of children: 1   Years of education: 16   Highest education level: Not on file  Occupational History   Occupation: WOUND CARE NURSES    Employer: [employer]  Tobacco Use   Smoking status: Former    Types: Cigarettes    Quit date: 10/22/2008    Years since quitting: 13.5   Smokeless tobacco: Never  Vaping Use   Vaping Use: Never used  Substance and Sexual Activity   Alcohol use: No    Alcohol/week: 0.0 standard drinks of alcohol    Comment: occasionally   Drug use: No   Sexual activity: Yes    Partners: Male    Birth control/protection: Condom, I.U.D.  Other Topics Concern   Not on file  Social History Narrative   Caffeine Use-yes   Regular exercise-no      Entered 03/2014:   Single Mom of 2 children   Children are:      Girl--17 months old      Boy--41 years old   She works  at [employer].    Currently works with wound care   Social Determinants of Health   Financial Resource Strain: Not on file  Food Insecurity: Not on file  Transportation Needs: Not on file  Physical Activity: Not on file  Stress: Not on file  Social Connections: Not on file  Intimate Partner Violence: Not on file      Review of Systems  All other systems reviewed and are negative.      Objective:   Physical Exam Vitals reviewed.  Constitutional:      General: [name] not in acute distress.    Appearance: Normal appearance. [name] not ill-appearing or toxic-appearing.  Cardiovascular:     Rate and Rhythm: Normal rate and regular rhythm.     Pulses: Normal pulses.     Heart sounds: Normal heart sounds. No murmur heard.    No friction rub. No gallop.  Pulmonary:     Effort: Pulmonary effort is normal. No  respiratory distress.     Breath sounds: No stridor. No wheezing, rhonchi or rales.  Musculoskeletal:     Right lower leg: No edema.     Left lower leg: No edema.  Neurological:     Mental Status: [name] alert.          Assessment & Plan:  General medical exam - Plan: CBC with Differential/Platelet, Lipid panel, COMPLETE METABOLIC PANEL WITH GFR  Essential hypertension - Plan: CBC with Differential/Platelet, Lipid panel, COMPLETE METABOLIC PANEL WITH GFR, US Renal Artery Stenosis, US Renal  Leukocytosis, unspecified type - Plan: CBC with Differential/Platelet, Lipid panel, COMPLETE METABOLIC PANEL WITH GFR  Other secondary hypertension - Plan: US Renal Artery Stenosis, US Renal I am concerned about secondary hypertension given how difficult to control her blood pressure has been despite being on 2 antihypertensives at maximum dose.  Therefore I will schedule the patient for renal ultrasound and also obtain a renal artery ultrasound to evaluate for renovascular hypertension.  Check CBC CMP and a lipid panel.  Monitor for creatinine elevation given the fact she is on max dose ARB which would be another indication for renal artery stenosis.  She received her tetanus shot today.  She will check her blood pressure daily and notify me of the values in 1 week and we will decide if we need to add a third agent at that point.

## 2022-05-03 ENCOUNTER — Other Ambulatory Visit: Payer: 59

## 2022-05-03 DIAGNOSIS — D72829 Elevated white blood cell count, unspecified: Secondary | ICD-10-CM | POA: Diagnosis not present

## 2022-05-03 LAB — CBC WITH DIFFERENTIAL/PLATELET
Absolute Monocytes: 1290 cells/uL — ABNORMAL HIGH (ref 200–950)
Basophils Absolute: 52 cells/uL (ref 0–200)
Basophils Relative: 0.3 %
Eosinophils Absolute: 138 cells/uL (ref 15–500)
Eosinophils Relative: 0.8 %
HCT: 39 % (ref 35.0–45.0)
Hemoglobin: 13.4 g/dL (ref 11.7–15.5)
Lymphs Abs: 4386 cells/uL — ABNORMAL HIGH (ref 850–3900)
MCH: 31 pg (ref 27.0–33.0)
MCHC: 34.4 g/dL (ref 32.0–36.0)
MCV: 90.3 fL (ref 80.0–100.0)
MPV: 11.5 fL (ref 7.5–12.5)
Monocytes Relative: 7.5 %
Neutro Abs: 11335 cells/uL — ABNORMAL HIGH (ref 1500–7800)
Neutrophils Relative %: 65.9 %
Platelets: 279 10*3/uL (ref 140–400)
RBC: 4.32 10*6/uL (ref 3.80–5.10)
RDW: 12.2 % (ref 11.0–15.0)
Total Lymphocyte: 25.5 %
WBC: 17.2 10*3/uL — ABNORMAL HIGH (ref 3.8–10.8)

## 2022-05-03 LAB — COMPLETE METABOLIC PANEL WITH GFR
AG Ratio: 1.8 (calc) (ref 1.0–2.5)
ALT: 18 U/L (ref 6–29)
AST: 16 U/L (ref 10–30)
Albumin: 4.6 g/dL (ref 3.6–5.1)
Alkaline phosphatase (APISO): 63 U/L (ref 31–125)
BUN: 17 mg/dL (ref 7–25)
CO2: 23 mmol/L (ref 20–32)
Calcium: 9.5 mg/dL (ref 8.6–10.2)
Chloride: 105 mmol/L (ref 98–110)
Creat: 0.87 mg/dL (ref 0.50–0.99)
Globulin: 2.5 g/dL (calc) (ref 1.9–3.7)
Glucose, Bld: 97 mg/dL (ref 65–99)
Potassium: 4 mmol/L (ref 3.5–5.3)
Sodium: 138 mmol/L (ref 135–146)
Total Bilirubin: 0.4 mg/dL (ref 0.2–1.2)
Total Protein: 7.1 g/dL (ref 6.1–8.1)
eGFR: 86 mL/min/{1.73_m2} (ref >=60)

## 2022-05-03 LAB — LIPID PANEL
Cholesterol: 151 mg/dL (ref <200)
HDL: 51 mg/dL (ref >=50)
LDL Cholesterol (Calc): 83 mg/dL (calc)
Non-HDL Cholesterol (Calc): 100 mg/dL (calc) (ref <130)
Total CHOL/HDL Ratio: 3 (calc) (ref <5.0)
Triglycerides: 79 mg/dL (ref <150)

## 2022-05-04 ENCOUNTER — Telehealth: Payer: Self-pay

## 2022-05-04 ENCOUNTER — Other Ambulatory Visit: Payer: Self-pay | Admitting: Family Medicine

## 2022-05-04 ENCOUNTER — Ambulatory Visit (HOSPITAL_COMMUNITY)
Admission: RE | Admit: 2022-05-04 | Discharge: 2022-05-04 | Disposition: A | Discharge status: Home / Self Care | Payer: 59 | Source: Ambulatory Visit | Attending: Family Medicine | Admitting: Family Medicine

## 2022-05-04 ENCOUNTER — Encounter: Payer: Self-pay | Admitting: Family Medicine

## 2022-05-04 DIAGNOSIS — I158 Other secondary hypertension: Secondary | ICD-10-CM | POA: Diagnosis not present

## 2022-05-04 DIAGNOSIS — I10 Essential (primary) hypertension: Secondary | ICD-10-CM | POA: Insufficient documentation

## 2022-05-04 NOTE — Telephone Encounter (Signed)
Spoke w/pt, stated that she has an appt [facility] dept @ [phone]  On Thursday,05/12/22, at 9am. Pt is aware that she needs to be NPO after mig-night the day before. Pt voiced understanding.   Send msg to pt via her MyChart as well, as a reminder.

## 2022-05-05 LAB — ANTI-NUCLEAR AB-TITER (ANA TITER): ANA Titer 1: 1:40 {titer} — ABNORMAL HIGH

## 2022-05-05 LAB — ANA, IFA COMPREHENSIVE PANEL
Anti Nuclear Antibody (ANA): POSITIVE — AB
ENA SM Ab Ser-aCnc: <1 AI
SM/RNP: <1 AI
SSA (Ro) (ENA) Antibody, IgG: <1 AI
SSB (La) (ENA) Antibody, IgG: <1 AI
Scleroderma (Scl-70) (ENA) Antibody, IgG: <1 AI
ds DNA Ab: 1 IU/mL

## 2022-05-05 LAB — SEDIMENTATION RATE: Sed Rate: 6 mm/h (ref 0–20)

## 2022-05-05 LAB — C-REACTIVE PROTEIN: CRP: 2.5 mg/L (ref <8.0)

## 2022-05-05 LAB — PATHOLOGIST SMEAR REVIEW

## 2022-05-05 NOTE — Telephone Encounter (Signed)
Spoke with this morning. Pt is aware of lab results. Pt voiced understanding and nothing at this time.

## 2022-05-12 ENCOUNTER — Ambulatory Visit (HOSPITAL_COMMUNITY)
Admission: RE | Admit: 2022-05-12 | Discharge: 2022-05-12 | Disposition: A | Discharge status: Home / Self Care | Payer: 59 | Source: Ambulatory Visit | Attending: Family Medicine | Admitting: Family Medicine

## 2022-05-12 DIAGNOSIS — I158 Other secondary hypertension: Secondary | ICD-10-CM | POA: Insufficient documentation

## 2022-05-12 DIAGNOSIS — I10 Essential (primary) hypertension: Secondary | ICD-10-CM | POA: Diagnosis not present

## 2022-05-12 NOTE — Progress Notes (Signed)
Renal artery duplex completed. Refer to "CV Proc" under chart review to view preliminary results.  05/12/2022 9:16 AM Eula Fried., MHA, RVT, RDCS, RDMS

## 2022-05-13 ENCOUNTER — Telehealth: Payer: Self-pay

## 2022-05-13 DIAGNOSIS — I701 Atherosclerosis of renal artery: Secondary | ICD-10-CM

## 2022-05-13 NOTE — Telephone Encounter (Signed)
Pt called this morning, give test VAS US Renal Art to pt. Told pt that per lab/Dr. Caren Macadam notes, he said Evidence of a > 60% stenosis of the right renal artery. Right kidney is >2cm smaller than left kidney.  Recommend consulting Dr. Allyson Sabal (cardiology) to determine if she would benefit from a stent.    Pt agreed to go to see specialist per Dr. Tanya Nones.  Referral placed

## 2022-05-16 ENCOUNTER — Encounter (HOSPITAL_COMMUNITY): Payer: Self-pay | Admitting: Psychiatry

## 2022-05-16 ENCOUNTER — Telehealth (HOSPITAL_BASED_OUTPATIENT_CLINIC_OR_DEPARTMENT_OTHER): Payer: 59 | Admitting: Psychiatry

## 2022-05-16 ENCOUNTER — Telehealth (HOSPITAL_COMMUNITY): Payer: Self-pay | Admitting: *Deleted

## 2022-05-16 ENCOUNTER — Other Ambulatory Visit (HOSPITAL_COMMUNITY): Payer: Self-pay

## 2022-05-16 ENCOUNTER — Other Ambulatory Visit (HOSPITAL_COMMUNITY): Payer: Self-pay | Admitting: Psychiatry

## 2022-05-16 VITALS — Wt 173.0 lb

## 2022-05-16 DIAGNOSIS — F419 Anxiety disorder, unspecified: Secondary | ICD-10-CM

## 2022-05-16 DIAGNOSIS — F319 Bipolar disorder, unspecified: Secondary | ICD-10-CM | POA: Diagnosis not present

## 2022-05-16 MED ORDER — ALPRAZOLAM 0.5 MG PO TABS: 0.5000 mg | ORAL_TABLET | Freq: Every day | ORAL | 0 refills | Status: DC | PRN

## 2022-05-16 MED ORDER — LAMOTRIGINE 25 MG PO TABS: ORAL_TABLET | ORAL | 1 refills | Status: DC

## 2022-05-16 MED ORDER — FLUOXETINE HCL 10 MG PO CAPS: ORAL_CAPSULE | ORAL | 1 refills | Status: DC
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
## 2022-05-16 NOTE — Telephone Encounter (Signed)
Done

## 2022-05-16 NOTE — Telephone Encounter (Signed)
Pt called stating that she forgot to tell you during appointment today that she needs Xanax refilled. Please send to [facility] on [address]. Please review.

## 2022-05-18 ENCOUNTER — Ambulatory Visit: Payer: 59 | Attending: Cardiovascular Disease | Admitting: Cardiovascular Disease

## 2022-05-18 ENCOUNTER — Encounter: Payer: Self-pay | Admitting: Cardiovascular Disease

## 2022-05-18 ENCOUNTER — Other Ambulatory Visit (HOSPITAL_COMMUNITY): Payer: Self-pay

## 2022-05-18 DIAGNOSIS — I10 Essential (primary) hypertension: Secondary | ICD-10-CM

## 2022-05-18 NOTE — Progress Notes (Signed)
   05/18/2022 Katlin B Hartung   12/25/1980  [id number]  Primary Physician Pickard, Warren T, MD Primary Cardiologist: Bryannah Boston J Jamarques Pinedo MD FACP, FACC, FAHA, FSCAI  HPI:  Amanda Strong is a 41 y.o. 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  She was referred by Dr. Warren Pickard for evaluation of renal vascular hypertension.  Hypertension is her only risk factor.  There is no family history for heart disease.  [name] never had heart attack or stroke.  [name] fairly active and is asymptomatic.  She had hypertension since the birth of her last child 14 years ago and it has been resistant on 2 medications recently.  Dr. Pickard was going to add a third medication but decided to get a renal Doppler study 05/12/2022 revealing a right renal aortic ratio 3.9 and left of 2.64.  Her right pole-to-pole dimension was 9.68 cm and left was 12 cm.  The highest velocity appeared to be in the mid right renal artery suggesting the possibility of fibromuscular dysplasia.   Current Meds  Medication Sig   ALPRAZolam (XANAX) 0.5 MG tablet Take 1 tablet (0.5 mg total) by mouth daily as needed for anxiety.   amLODipine (NORVASC) 10 MG tablet Take 1 tablet (10 mg total) by mouth daily.   FLUoxetine (PROZAC) 10 MG capsule Take one capsule daily for one week and than two capsule daily   lamoTRIgine (LAMICTAL) 25 MG tablet Taking one tab daily for one week and than two tab daily   levonorgestrel (MIRENA, 52 MG,) 20 MCG/DAY IUD Mirena 20 mcg/24 hours (8 yrs) 52 mg intrauterine device  Take 1 device every day by intrauterine route.   valsartan (DIOVAN) 320 MG tablet Take 1 tablet (320 mg total) by mouth daily.   [DISCONTINUED] ARIPiprazole (ABILIFY) 5 MG tablet Take 1 tablet (5 mg total) by mouth daily.   [DISCONTINUED] PARoxetine (PAXIL) 30 MG tablet Take 1 tablet (30 mg total) by mouth daily.     Allergies   Allergen Reactions   Cephalexin     REACTION: intense itching all over   Percocet [Oxycodone-Acetaminophen] Itching    Social History   Socioeconomic History   Marital status: Single    Spouse name: Not on file   Number of children: 1   Years of education: 16   Highest education level: Not on file  Occupational History   Occupation: WOUND CARE NURSES    Employer: [employer]  Tobacco Use   Smoking status: Former    Types: Cigarettes    Quit date: 10/22/2008    Years since quitting: 13.5   Smokeless tobacco: Never  Vaping Use   Vaping Use: Never used  Substance and Sexual Activity   Alcohol use: No    Alcohol/week: 0.0 standard drinks of alcohol    Comment: occasionally   Drug use: No   Sexual activity: Yes    Partners: Male    Birth control/protection: Condom, I.U.D.  Other Topics Concern   Not on file  Social History Narrative   Caffeine Use-yes   Regular exercise-no      Entered 03/2014:   Single Mom of 2 children   Children are:      Girl--18 months old      Boy--41 years old   She works at [employer].    Currently works with wound care   Social Determinants of Health     Financial Resource Strain: Not on file  Food Insecurity: Not on file  Transportation Needs: Not on file  Physical Activity: Not on file  Stress: Not on file  Social Connections: Not on file  Intimate Partner Violence: Not on file     Review of Systems: General: negative for chills, fever, night sweats or weight changes.  Cardiovascular: negative for chest pain, dyspnea on exertion, edema, orthopnea, palpitations, paroxysmal nocturnal dyspnea or shortness of breath Dermatological: negative for rash Respiratory: negative for cough or wheezing Urologic: negative for hematuria Abdominal: negative for nausea, vomiting, diarrhea, bright red blood per rectum, melena, or hematemesis Neurologic: negative for visual changes, syncope, or dizziness All other systems  reviewed and are otherwise negative except as noted above.    Blood pressure 122/70, pulse 76, height 5\' 7"  (1.702 m), weight 174 lb (78.9 kg).  General appearance: alert and no distress Neck: no adenopathy, no carotid bruit, no JVD, supple, symmetrical, trachea midline, and thyroid not enlarged, symmetric, no tenderness/mass/nodules Lungs: clear to auscultation bilaterally Heart: regular rate and rhythm, S1, S2 normal, no murmur, click, rub or gallop Extremities: extremities normal, atraumatic, no cyanosis or edema Pulses: 2+ and symmetric Skin: Skin color, texture, turgor normal. No rashes or lesions Neurologic: Grossly normal  EKG sinus rhythm at 76 without ST or T wave changes.  Personally reviewed this EKG.  ASSESSMENT AND PLAN:   Essential hypertension Ms. Roebuck was referred to me by Dr. Levada Schilling for evaluation of potential renal vascular hypertension.  She has had hypertension for 41 years after her last pregnancy.  She currently is on 2 antihypertensive medications with blood pressures that are still elevated.  She had renal Doppler studies performed 05/08/2022 revealing a right renal aortic ratio of 3.91 with a pole-to-pole dimension on the right of 9.68 cm and on the left of 12 cm suggesting the possibility of renal artery stenosis contributing to renal vascular hypertension.  It is unclear whether this is atherosclerotic or FMD.  I am going to get a abdominal CTA to further evaluate and at this correlates with the Doppler study I have agreed to perform angiography potential intervention.     05/10/2022 MD FACP,FACC,FAHA, [facility] 05/18/2022 10:19 AM

## 2022-05-18 NOTE — H&P (View-Only) (Signed)
05/18/2022 Amanda Strong   10-20-1980  [id number]  Primary Physician Tanya Nones Priscille Heidelberg, MD Primary Cardiologist: Runell Gess MD Nicholes Calamity, [place]  HPI:  Amanda Strong is a 41 y.o. moderately overweight single African-American female mother of 2 children (1 son and 1 daughter) who works as an Public house manager at [employer].  She was referred by Dr. Lynnea Ferrier for evaluation of renal vascular hypertension.  Hypertension is her only risk factor.  There is no family history for heart disease.  [name] never had heart attack or stroke.  [name] fairly active and is asymptomatic.  She had hypertension since the birth of her last child 14 years ago and it has been resistant on 2 medications recently.  Dr. Tanya Nones was going to add a third medication but decided to get a renal Doppler study 05/12/2022 revealing a right renal aortic ratio 3.9 and left of 2.64.  Her right pole-to-pole dimension was 9.68 cm and left was 12 cm.  The highest velocity appeared to be in the mid right renal artery suggesting the possibility of fibromuscular dysplasia.   Current Meds  Medication Sig   ALPRAZolam (XANAX) 0.5 MG tablet Take 1 tablet (0.5 mg total) by mouth daily as needed for anxiety.   amLODipine (NORVASC) 10 MG tablet Take 1 tablet (10 mg total) by mouth daily.   FLUoxetine (PROZAC) 10 MG capsule Take one capsule daily for one week and than two capsule daily   lamoTRIgine (LAMICTAL) 25 MG tablet Taking one tab daily for one week and than two tab daily   levonorgestrel (MIRENA, 52 MG,) 20 MCG/DAY IUD Mirena 20 mcg/24 hours (8 yrs) 52 mg intrauterine device  Take 1 device every day by intrauterine route.   valsartan (DIOVAN) 320 MG tablet Take 1 tablet (320 mg total) by mouth daily.   [DISCONTINUED] ARIPiprazole (ABILIFY) 5 MG tablet Take 1 tablet (5 mg total) by mouth daily.   [DISCONTINUED] PARoxetine (PAXIL) 30 MG tablet Take 1 tablet (30 mg total) by mouth daily.     Allergies   Allergen Reactions   Cephalexin     REACTION: intense itching all over   Percocet [Oxycodone-Acetaminophen] Itching    Social History   Socioeconomic History   Marital status: Single    Spouse name: Not on file   Number of children: 1   Years of education: 16   Highest education level: Not on file  Occupational History   Occupation: WOUND CARE NURSES    Employer: [employer]  Tobacco Use   Smoking status: Former    Types: Cigarettes    Quit date: 10/22/2008    Years since quitting: 13.5   Smokeless tobacco: Never  Vaping Use   Vaping Use: Never used  Substance and Sexual Activity   Alcohol use: No    Alcohol/week: 0.0 standard drinks of alcohol    Comment: occasionally   Drug use: No   Sexual activity: Yes    Partners: Male    Birth control/protection: Condom, I.U.D.  Other Topics Concern   Not on file  Social History Narrative   Caffeine Use-yes   Regular exercise-no      Entered 03/2014:   Single Mom of 2 children   Children are:      Girl--60 months old      Boy--41 years old   She works at [employer].    Currently works with wound care   Social Determinants of Health  Financial Resource Strain: Not on file  Food Insecurity: Not on file  Transportation Needs: Not on file  Physical Activity: Not on file  Stress: Not on file  Social Connections: Not on file  Intimate Partner Violence: Not on file     Review of Systems: General: negative for chills, fever, night sweats or weight changes.  Cardiovascular: negative for chest pain, dyspnea on exertion, edema, orthopnea, palpitations, paroxysmal nocturnal dyspnea or shortness of breath Dermatological: negative for rash Respiratory: negative for cough or wheezing Urologic: negative for hematuria Abdominal: negative for nausea, vomiting, diarrhea, bright red blood per rectum, melena, or hematemesis Neurologic: negative for visual changes, syncope, or dizziness All other systems  reviewed and are otherwise negative except as noted above.    Blood pressure 122/70, pulse 76, height 5\' 7"  (1.702 m), weight 174 lb (78.9 kg).  General appearance: alert and no distress Neck: no adenopathy, no carotid bruit, no JVD, supple, symmetrical, trachea midline, and thyroid not enlarged, symmetric, no tenderness/mass/nodules Lungs: clear to auscultation bilaterally Heart: regular rate and rhythm, S1, S2 normal, no murmur, click, rub or gallop Extremities: extremities normal, atraumatic, no cyanosis or edema Pulses: 2+ and symmetric Skin: Skin color, texture, turgor normal. No rashes or lesions Neurologic: Grossly normal  EKG sinus rhythm at 76 without ST or T wave changes.  Personally reviewed this EKG.  ASSESSMENT AND PLAN:   Essential hypertension Amanda Strong was referred to me by Dr. Levada Schilling for evaluation of potential renal vascular hypertension.  She has had hypertension for 16 years after her last pregnancy.  She currently is on 2 antihypertensive medications with blood pressures that are still elevated.  She had renal Doppler studies performed 05/08/2022 revealing a right renal aortic ratio of 3.91 with a pole-to-pole dimension on the right of 9.68 cm and on the left of 12 cm suggesting the possibility of renal artery stenosis contributing to renal vascular hypertension.  It is unclear whether this is atherosclerotic or FMD.  I am going to get a abdominal CTA to further evaluate and at this correlates with the Doppler study I have agreed to perform angiography potential intervention.     05/10/2022 MD FACP,FACC,FAHA, [facility] 05/18/2022 10:19 AM

## 2022-05-18 NOTE — Assessment & Plan Note (Signed)
Amanda Strong was referred to me by Dr. Tanya Nones for evaluation of potential renal vascular hypertension.  She has had hypertension for 16 years after her last pregnancy.  She currently is on 2 antihypertensive medications with blood pressures that are still elevated.  She had renal Doppler studies performed 05/08/2022 revealing a right renal aortic ratio of 3.91 with a pole-to-pole dimension on the right of 9.68 cm and on the left of 12 cm suggesting the possibility of renal artery stenosis contributing to renal vascular hypertension.  It is unclear whether this is atherosclerotic or FMD.  I am going to get a abdominal CTA to further evaluate and at this correlates with the Doppler study I have agreed to perform angiography potential intervention.

## 2022-05-18 NOTE — Patient Instructions (Signed)
Medication Instructions:  Your physician recommends that you continue on your current medications as directed. Please refer to the Current Medication list given to you today.  *If you need a refill on your cardiac medications before your next appointment, please call your pharmacy*   Lab Work: Your physician recommends that you have labs drawn today: BMET & CBC  If you have labs (blood work) drawn today and your tests are completely normal, you will receive your results only by: MyChart Message (if you have MyChart) OR A paper copy in the mail If you have any lab test that is abnormal or we need to change your treatment, we will call you to review the results.   Testing/Procedures: Non-Cardiac CT Angiography (CTA) of abdomen/pelvis, is a special type of CT scan that uses a computer to produce multi-dimensional views of major blood vessels throughout the body. In CT angiography, a contrast material is injected through an IV to help visualize the blood vessels This procedure will be done at [facility], 2nd Floor     Follow-Up: At [facility], you and your health needs are our priority.  As part of our continuing mission to provide you with exceptional heart care, we have created designated Provider Care Teams.  These Care Teams include your primary Cardiologist (physician) and Advanced Practice Providers (APPs -  Physician Assistants and Nurse Practitioners) who all work together to provide you with the care you need, when you need it.  We recommend signing up for the patient portal called "MyChart".  Sign up information is provided on this After Visit Summary.  MyChart is used to connect with patients for Virtual Visits (Telemedicine).  Patients are able to view lab/test results, encounter notes, upcoming appointments, etc.  Non-urgent messages can be sent to your provider as well.   To learn more about what you can do with MyChart, go to [email or web address].     Your next appointment:   To be determined  Provider:   Nanetta Batty, MD

## 2022-05-19 LAB — BASIC METABOLIC PANEL
BUN/Creatinine Ratio: 21 (ref 9–23)
BUN: 16 mg/dL (ref 6–24)
CO2: 21 mmol/L (ref 20–29)
Calcium: 9.3 mg/dL (ref 8.7–10.2)
Chloride: 103 mmol/L (ref 96–106)
Creatinine, Ser: 0.77 mg/dL (ref 0.57–1.00)
Glucose: 83 mg/dL (ref 70–99)
Potassium: 4.5 mmol/L (ref 3.5–5.2)
Sodium: 140 mmol/L (ref 134–144)
eGFR: 100 mL/min/{1.73_m2} (ref >59)

## 2022-05-19 LAB — CBC
Hematocrit: 42.2 % (ref 34.0–46.6)
Hemoglobin: 14.2 g/dL (ref 11.1–15.9)
MCH: 30.9 pg (ref 26.6–33.0)
MCHC: 33.6 g/dL (ref 31.5–35.7)
MCV: 92 fL (ref 79–97)
Platelets: 202 10*3/uL (ref 150–450)
RBC: 4.6 x10E6/uL (ref 3.77–5.28)
RDW: 12.2 % (ref 11.7–15.4)
WBC: 10.2 10*3/uL (ref 3.4–10.8)

## 2022-05-24 ENCOUNTER — Other Ambulatory Visit: Payer: 59

## 2022-05-24 DIAGNOSIS — D72829 Elevated white blood cell count, unspecified: Secondary | ICD-10-CM

## 2022-05-24 LAB — CBC WITH DIFFERENTIAL/PLATELET
Absolute Monocytes: 974 cells/uL — ABNORMAL HIGH (ref 200–950)
Basophils Absolute: 34 cells/uL (ref 0–200)
Basophils Relative: 0.3 %
Eosinophils Absolute: 123 cells/uL (ref 15–500)
Eosinophils Relative: 1.1 %
HCT: 36.8 % (ref 35.0–45.0)
Hemoglobin: 12.7 g/dL (ref 11.7–15.5)
Lymphs Abs: 2744 cells/uL (ref 850–3900)
MCH: 31.5 pg (ref 27.0–33.0)
MCHC: 34.5 g/dL (ref 32.0–36.0)
MCV: 91.3 fL (ref 80.0–100.0)
MPV: 12.2 fL (ref 7.5–12.5)
Monocytes Relative: 8.7 %
Neutro Abs: 7325 cells/uL (ref 1500–7800)
Neutrophils Relative %: 65.4 %
Platelets: 199 10*3/uL (ref 140–400)
RBC: 4.03 10*6/uL (ref 3.80–5.10)
RDW: 12.1 % (ref 11.0–15.0)
Total Lymphocyte: 24.5 %
WBC: 11.2 10*3/uL — ABNORMAL HIGH (ref 3.8–10.8)

## 2022-05-25 ENCOUNTER — Encounter: Payer: Self-pay | Admitting: Family Medicine

## 2022-05-27 ENCOUNTER — Ambulatory Visit (HOSPITAL_COMMUNITY)
Admission: RE | Admit: 2022-05-27 | Discharge: 2022-05-27 | Disposition: A | Discharge status: Home / Self Care | Payer: 59 | Source: Ambulatory Visit | Attending: Cardiovascular Disease | Admitting: Cardiovascular Disease

## 2022-05-27 DIAGNOSIS — I10 Essential (primary) hypertension: Secondary | ICD-10-CM | POA: Insufficient documentation

## 2022-05-27 MED ORDER — IOHEXOL 350 MG/ML SOLN
100.0000 mL | Freq: Once | INTRAVENOUS | Status: AC | PRN
  Administered 2022-05-27: 100 mL via INTRAVENOUS

## 2022-05-30 ENCOUNTER — Encounter: Payer: Self-pay | Admitting: Cardiovascular Disease

## 2022-05-30 DIAGNOSIS — I10 Essential (primary) hypertension: Secondary | ICD-10-CM

## 2022-05-30 NOTE — Telephone Encounter (Signed)
Spoke with pt regarding renal angiogram. Pt is ok to move forward with scheduling procedure. Mychart message with instructions sent to pt. Pt has already completed lab work. Will place order for renal dopplers to be done after procedure as well as schedule follow up visit with Dr. Allyson Sabal. Work note also sent to pt via Clinical cytogeneticist. Pt verbalizes understanding.

## 2022-06-01 ENCOUNTER — Telehealth (HOSPITAL_COMMUNITY): Payer: Self-pay | Admitting: *Deleted

## 2022-06-01 NOTE — Telephone Encounter (Signed)
Writer spoke with pt who called asking to clarify doses on the Prozac and Lamictal. Writer confirmed that pt should be taking 2 pills of each med, which pt confirmed. Pt did note that she has developed "itchy red spots" on several areas of her body.  Writer advised that pt not take any more Lamictal until Dr. Lolly Mustache can be notified. Pt disappointed due to the fact that the medication was working she said. Pt agreed. Pt asked about benadryl which writer advised may help with the itching and swelling if needed. Pt denies any new detergents, soaps, or lotions. Pt next appointment is scheduled for 06/1022. Please review and advise.

## 2022-06-02 NOTE — Telephone Encounter (Signed)
Pt agrees. Will call Monday with update.

## 2022-06-02 NOTE — Telephone Encounter (Signed)
Stop the Lamictal.  Once itching and rash completely resolved then she can try lithium 300 mg a day.

## 2022-06-07 ENCOUNTER — Other Ambulatory Visit (HOSPITAL_COMMUNITY): Payer: Self-pay | Admitting: Psychiatry

## 2022-06-07 ENCOUNTER — Other Ambulatory Visit: Payer: Self-pay

## 2022-06-07 DIAGNOSIS — I701 Atherosclerosis of renal artery: Secondary | ICD-10-CM

## 2022-06-07 DIAGNOSIS — F319 Bipolar disorder, unspecified: Secondary | ICD-10-CM

## 2022-06-07 DIAGNOSIS — F419 Anxiety disorder, unspecified: Secondary | ICD-10-CM

## 2022-06-07 DIAGNOSIS — I10 Essential (primary) hypertension: Secondary | ICD-10-CM

## 2022-06-07 MED ORDER — SODIUM CHLORIDE 0.9% FLUSH: 3.0000 mL | Freq: Two times a day (BID) | INTRAVENOUS | Status: DC

## 2022-06-08 ENCOUNTER — Other Ambulatory Visit (HOSPITAL_COMMUNITY): Payer: Self-pay | Admitting: *Deleted

## 2022-06-08 ENCOUNTER — Telehealth (HOSPITAL_COMMUNITY): Payer: Self-pay | Admitting: *Deleted

## 2022-06-08 MED ORDER — LITHIUM CARBONATE 300 MG PO TABS: 300.0000 mg | ORAL_TABLET | Freq: Every day | ORAL | 0 refills | Status: DC

## 2022-06-08 NOTE — Telephone Encounter (Signed)
Writer LVM for pt in return to her call that her "red spots" have totally resolves since d/c Lamictal and she is ready to start the Lithium. Writer advised that Rx has been sent to [facility] on [address]. And also that [facility] results mailed to her as reuqested. Pt advised to call this nurse back with any questions or concerns.

## 2022-06-09 ENCOUNTER — Telehealth: Payer: Self-pay | Admitting: *Deleted

## 2022-06-09 NOTE — Telephone Encounter (Signed)
Renal Angiogram scheduled at [facility] for:  Monday June 13, 2022 12 Noon Arrival time and place: [facility] Main Entrance A at: 10 AM  Nothing to eat after midnight prior to procedure, clear liquids until 5 AM day of procedure.  Medication instructions: -Usual morning medications can be taken with sips of water including aspirin 81 mg.  Confirmed patient has responsible adult to drive home post procedure and be with patient first 24 hours after arriving home.  Patient reports no new symptoms concerning for COVID-19 in the past 10 days.  Reviewed procedure instructions with patient.

## 2022-06-13 ENCOUNTER — Other Ambulatory Visit: Payer: Self-pay

## 2022-06-13 ENCOUNTER — Encounter (HOSPITAL_COMMUNITY)
Admission: RE | Disposition: A | Discharge status: Home / Self Care | Payer: Self-pay | Source: Home / Self Care | Attending: Cardiovascular Disease

## 2022-06-13 ENCOUNTER — Ambulatory Visit (HOSPITAL_COMMUNITY)
Admission: RE | Admit: 2022-06-13 | Discharge: 2022-06-14 | Disposition: A | Discharge status: Home / Self Care | Payer: 59 | Attending: Cardiovascular Disease | Admitting: Cardiovascular Disease

## 2022-06-13 DIAGNOSIS — E785 Hyperlipidemia, unspecified: Secondary | ICD-10-CM | POA: Insufficient documentation

## 2022-06-13 DIAGNOSIS — I150 Renovascular hypertension: Secondary | ICD-10-CM | POA: Insufficient documentation

## 2022-06-13 DIAGNOSIS — Z87891 Personal history of nicotine dependence: Secondary | ICD-10-CM | POA: Insufficient documentation

## 2022-06-13 DIAGNOSIS — I10 Essential (primary) hypertension: Secondary | ICD-10-CM

## 2022-06-13 DIAGNOSIS — I701 Atherosclerosis of renal artery: Secondary | ICD-10-CM | POA: Diagnosis not present

## 2022-06-13 HISTORY — PX: PERIPHERAL VASCULAR BALLOON ANGIOPLASTY: CATH118281

## 2022-06-13 HISTORY — PX: RENAL ANGIOGRAPHY: CATH118260

## 2022-06-13 LAB — PREGNANCY, URINE: Preg Test, Ur: NEGATIVE

## 2022-06-13 LAB — POCT ACTIVATED CLOTTING TIME
Activated Clotting Time: 263 seconds
Activated Clotting Time: 287 seconds
Activated Clotting Time: 197 seconds
Activated Clotting Time: 173 seconds

## 2022-06-13 SURGERY — RENAL ANGIOGRAPHY
Anesthesia: LOCAL

## 2022-06-13 MED ORDER — HYDRALAZINE HCL 20 MG/ML IJ SOLN: 5.0000 mg | INTRAMUSCULAR | Status: DC | PRN

## 2022-06-13 MED ORDER — SODIUM CHLORIDE 0.9 % IV SOLN: 250.0000 mL | INTRAVENOUS | Status: DC | PRN

## 2022-06-13 MED ORDER — ONDANSETRON HCL 4 MG/2ML IJ SOLN: 4.0000 mg | Freq: Four times a day (QID) | INTRAMUSCULAR | Status: DC | PRN

## 2022-06-13 MED ORDER — FLUOXETINE HCL 10 MG PO CAPS
10.0000 mg | ORAL_CAPSULE | Freq: Every day | ORAL | Status: DC
  Filled 2022-06-13: qty 1

## 2022-06-13 MED ORDER — LIDOCAINE HCL (PF) 1 % IJ SOLN
INTRAMUSCULAR | Status: DC | PRN
  Administered 2022-06-13: 25 mL

## 2022-06-13 MED ORDER — CLOPIDOGREL BISULFATE 75 MG PO TABS
75.0000 mg | ORAL_TABLET | Freq: Every day | ORAL | Status: DC
  Filled 2022-06-13: qty 1

## 2022-06-13 MED ORDER — FENTANYL CITRATE (PF) 100 MCG/2ML IJ SOLN
INTRAMUSCULAR | Status: DC | PRN
  Administered 2022-06-13: 25 ug via INTRAVENOUS

## 2022-06-13 MED ORDER — ASPIRIN 81 MG PO TBEC
81.0000 mg | DELAYED_RELEASE_TABLET | Freq: Every day | ORAL | Status: DC
  Filled 2022-06-13: qty 1

## 2022-06-13 MED ORDER — LABETALOL HCL 5 MG/ML IV SOLN: 10.0000 mg | INTRAVENOUS | Status: DC | PRN

## 2022-06-13 MED ORDER — MORPHINE SULFATE (PF) 2 MG/ML IV SOLN: 2.0000 mg | INTRAVENOUS | Status: DC | PRN

## 2022-06-13 MED ORDER — SODIUM CHLORIDE 0.9 % WEIGHT BASED INFUSION: 1.0000 mL/kg/h | INTRAVENOUS | Status: DC

## 2022-06-13 MED ORDER — HEPARIN (PORCINE) IN NACL 1000-0.9 UT/500ML-% IV SOLN
INTRAVENOUS | Status: DC | PRN
  Administered 2022-06-13 (×2): 500 mL

## 2022-06-13 MED ORDER — SODIUM CHLORIDE 0.9% FLUSH: 3.0000 mL | INTRAVENOUS | Status: DC | PRN

## 2022-06-13 MED ORDER — SODIUM CHLORIDE 0.9% FLUSH: 3.0000 mL | Freq: Two times a day (BID) | INTRAVENOUS | Status: DC

## 2022-06-13 MED ORDER — MIDAZOLAM HCL 2 MG/2ML IJ SOLN
INTRAMUSCULAR | Status: DC | PRN
  Administered 2022-06-13: 1 mg via INTRAVENOUS

## 2022-06-13 MED ORDER — ACETAMINOPHEN 325 MG PO TABS: 650.0000 mg | ORAL_TABLET | ORAL | Status: DC | PRN

## 2022-06-13 MED ORDER — IODIXANOL 320 MG/ML IV SOLN
INTRAVENOUS | Status: DC | PRN
  Administered 2022-06-13: 130 mL

## 2022-06-13 MED ORDER — SODIUM CHLORIDE 0.9 % IV SOLN: INTRAVENOUS | Status: AC

## 2022-06-13 MED ORDER — HEPARIN SODIUM (PORCINE) 1000 UNIT/ML IJ SOLN
INTRAMUSCULAR | Status: AC
  Filled 2022-06-13: qty 10

## 2022-06-13 MED ORDER — ATORVASTATIN CALCIUM 40 MG PO TABS
40.0000 mg | ORAL_TABLET | Freq: Every day | ORAL | Status: DC
  Filled 2022-06-13 (×2): qty 1

## 2022-06-13 MED ORDER — LITHIUM CARBONATE 300 MG PO TABS: 300.0000 mg | ORAL_TABLET | Freq: Every day | ORAL | Status: DC

## 2022-06-13 MED ORDER — AMLODIPINE BESYLATE 5 MG PO TABS: 10.0000 mg | ORAL_TABLET | Freq: Every day | ORAL | Status: DC

## 2022-06-13 MED ORDER — ASPIRIN 81 MG PO CHEW: 81.0000 mg | CHEWABLE_TABLET | ORAL | Status: DC

## 2022-06-13 MED ORDER — LIDOCAINE HCL (PF) 1 % IJ SOLN
INTRAMUSCULAR | Status: AC
  Filled 2022-06-13: qty 30

## 2022-06-13 MED ORDER — CLOPIDOGREL BISULFATE 300 MG PO TABS
ORAL_TABLET | ORAL | Status: AC
  Filled 2022-06-13: qty 1

## 2022-06-13 MED ORDER — ALPRAZOLAM 0.25 MG PO TABS: 0.5000 mg | ORAL_TABLET | Freq: Every day | ORAL | Status: DC | PRN

## 2022-06-13 MED ORDER — FENTANYL CITRATE (PF) 100 MCG/2ML IJ SOLN
INTRAMUSCULAR | Status: AC
  Filled 2022-06-13: qty 2

## 2022-06-13 MED ORDER — HEPARIN SODIUM (PORCINE) 1000 UNIT/ML IJ SOLN
INTRAMUSCULAR | Status: DC | PRN
  Administered 2022-06-13: 8000 [IU] via INTRAVENOUS

## 2022-06-13 MED ORDER — CLOPIDOGREL BISULFATE 300 MG PO TABS
ORAL_TABLET | ORAL | Status: DC | PRN
  Administered 2022-06-13: 300 mg via ORAL

## 2022-06-13 MED ORDER — LITHIUM CARBONATE 300 MG PO CAPS
300.0000 mg | ORAL_CAPSULE | Freq: Every day | ORAL | Status: DC
  Filled 2022-06-13: qty 1

## 2022-06-13 MED ORDER — SODIUM CHLORIDE 0.9 % WEIGHT BASED INFUSION
3.0000 mL/kg/h | INTRAVENOUS | Status: DC
  Administered 2022-06-13: 3 mL/kg/h via INTRAVENOUS

## 2022-06-13 MED ORDER — MIDAZOLAM HCL 2 MG/2ML IJ SOLN
INTRAMUSCULAR | Status: AC
  Filled 2022-06-13: qty 2

## 2022-06-13 MED ORDER — HEPARIN (PORCINE) IN NACL 1000-0.9 UT/500ML-% IV SOLN
INTRAVENOUS | Status: AC
  Filled 2022-06-13: qty 1000

## 2022-06-13 MED ORDER — IRBESARTAN 300 MG PO TABS
300.0000 mg | ORAL_TABLET | Freq: Every day | ORAL | Status: DC
  Filled 2022-06-13: qty 1

## 2022-06-13 SURGICAL SUPPLY — 18 items
BALLN VIATRAC 4X20X135 (BALLOONS) ×2
BALLN VIATRAC 5X20X135 (BALLOONS) ×2
BALLOON VIATRAC 4X20X135 (BALLOONS) IMPLANT
BALLOON VIATRAC 5X20X135 (BALLOONS) IMPLANT
CATH ANGIO 5F PIGTAIL 65CM (CATHETERS) IMPLANT
DEVICE CONTINUOUS FLUSH (MISCELLANEOUS) IMPLANT
GUIDE CATH VISTA JR4 6F (CATHETERS) IMPLANT
KIT ENCORE 26 ADVANTAGE (KITS) IMPLANT
KIT PV (KITS) ×2 IMPLANT
SHEATH PINNACLE 6F 10CM (SHEATH) IMPLANT
SHEATH PROBE COVER 6X72 (BAG) IMPLANT
STOPCOCK MORSE 400PSI 3WAY (MISCELLANEOUS) IMPLANT
SYR MEDRAD MARK 7 150ML (SYRINGE) ×2 IMPLANT
TRANSDUCER W/STOPCOCK (MISCELLANEOUS) ×2 IMPLANT
TRAY PV CATH (CUSTOM PROCEDURE TRAY) ×2 IMPLANT
TUBING CIL FLEX 10 FLL-RA (TUBING) IMPLANT
WIRE HITORQ VERSACORE ST 145CM (WIRE) IMPLANT
WIRE STABILIZER XS .014X180CM (WIRE) IMPLANT

## 2022-06-13 NOTE — Progress Notes (Signed)
Patient unable to urinate using bedpan or purwick. Urinated in cath lab. Bladder scan done and results where > 500 with 3 checks. Notified PA and orders received for In and Out cath. Patient cathed for 700 mls. Tolerated well.

## 2022-06-13 NOTE — Interval H&P Note (Signed)
History and Physical Interval Note:  06/13/2022 12:23 PM  Amanda Strong  has presented today for surgery, with the diagnosis of renal artery stenosis.  The various methods of treatment have been discussed with the patient and family. After consideration of risks, benefits and other options for treatment, the patient has consented to  Procedure(s): RENAL ANGIOGRAPHY (N/A) as a surgical intervention.  The patient's history has been reviewed, patient examined, no change in status, stable for surgery.  I have reviewed the patient's chart and labs.  Questions were answered to the patient's satisfaction.     [name]

## 2022-06-13 NOTE — Progress Notes (Signed)
Site area: rt groin Site Prior to Removal:  Level 0 Pressure Applied For: 20 minutes Manual:   yes Patient Status During Pull:  stable Post Pull Site:  Level 0 Post Pull Instructions Given:  yes Post Pull Pulses Present: rt dp palpable Dressing Applied:  gauze and tegaderm Bedrest begins @ 1615 Comments:

## 2022-06-14 ENCOUNTER — Encounter (HOSPITAL_COMMUNITY): Payer: Self-pay | Admitting: Cardiovascular Disease

## 2022-06-14 ENCOUNTER — Encounter (HOSPITAL_COMMUNITY): Payer: 59

## 2022-06-14 ENCOUNTER — Other Ambulatory Visit (HOSPITAL_COMMUNITY): Payer: Self-pay

## 2022-06-14 DIAGNOSIS — E785 Hyperlipidemia, unspecified: Secondary | ICD-10-CM

## 2022-06-14 DIAGNOSIS — I150 Renovascular hypertension: Secondary | ICD-10-CM

## 2022-06-14 DIAGNOSIS — I701 Atherosclerosis of renal artery: Secondary | ICD-10-CM | POA: Diagnosis not present

## 2022-06-14 DIAGNOSIS — Z87891 Personal history of nicotine dependence: Secondary | ICD-10-CM | POA: Diagnosis not present

## 2022-06-14 LAB — LIPID PANEL
Cholesterol: 161 mg/dL (ref 0–200)
HDL: 42 mg/dL (ref >40)
LDL Cholesterol: 107 mg/dL — ABNORMAL HIGH (ref 0–99)
Total CHOL/HDL Ratio: 3.8 RATIO
Triglycerides: 62 mg/dL (ref <150)
VLDL: 12 mg/dL (ref 0–40)

## 2022-06-14 LAB — CBC
HCT: 38.2 % (ref 36.0–46.0)
Hemoglobin: 13 g/dL (ref 12.0–15.0)
MCH: 30.6 pg (ref 26.0–34.0)
MCHC: 34 g/dL (ref 30.0–36.0)
MCV: 89.9 fL (ref 80.0–100.0)
Platelets: 230 10*3/uL (ref 150–400)
RBC: 4.25 MIL/uL (ref 3.87–5.11)
RDW: 12.3 % (ref 11.5–15.5)
WBC: 15 10*3/uL — ABNORMAL HIGH (ref 4.0–10.5)
nRBC: 0 % (ref 0.0–0.2)

## 2022-06-14 LAB — BASIC METABOLIC PANEL
Anion gap: 6 (ref 5–15)
BUN: 8 mg/dL (ref 6–20)
CO2: 23 mmol/L (ref 22–32)
Calcium: 8.9 mg/dL (ref 8.9–10.3)
Chloride: 107 mmol/L (ref 98–111)
Creatinine, Ser: 0.73 mg/dL (ref 0.44–1.00)
GFR, Estimated: >60 mL/min (ref >60)
Glucose, Bld: 103 mg/dL — ABNORMAL HIGH (ref 70–99)
Potassium: 3.7 mmol/L (ref 3.5–5.1)
Sodium: 136 mmol/L (ref 135–145)

## 2022-06-14 MED ORDER — ATORVASTATIN CALCIUM 40 MG PO TABS
40.0000 mg | ORAL_TABLET | Freq: Every day | ORAL | 0 refills | Status: DC
  Filled 2022-06-14: qty 90, 90d supply, fill #0

## 2022-06-14 MED ORDER — ASPIRIN 81 MG PO TBEC: 81.0000 mg | DELAYED_RELEASE_TABLET | Freq: Every day | ORAL | 1 refills | Status: DC

## 2022-06-14 MED ORDER — CLOPIDOGREL BISULFATE 75 MG PO TABS
75.0000 mg | ORAL_TABLET | Freq: Every day | ORAL | 1 refills | Status: DC
  Filled 2022-06-14 – 2022-09-21 (×2): qty 90, 90d supply, fill #0

## 2022-06-14 NOTE — Discharge Summary (Signed)
Discharge Summary    Patient ID: Amanda Strong MRN: [id number]; DOB: [birth year]  Admit date: 06/13/2022 Discharge date: 06/14/2022  PCP:  Susy Frizzle, MD   [facility] Providers Cardiologist:  None       Discharge Diagnoses    Principal Problem:   Renovascular hypertension Active Problems:   Hyperlipidemia   Diagnostic Studies/Procedures    Renal angiogram: 06/13/22  Procedures Performed:             1.  Right common femoral access             2.  Abdominal aortogram             3.  Selective right renal angiogram             4.  PTA right renal artery  Final Impression: Successful right renal artery FMD PTA for presumed renal vessel hypertension.  The patient received 300 mg of p.o. clopidogrel.  The sheath will be removed once ACT falls below 170 pressure held.  She will be hydrated overnight and discharged home in the morning.  We will get renal Dopplers on her in our [place] office next week and I will see her back the week after follow-up.  She left the lab in stable condition.   [name]. MD, [facility] 06/13/2022 1:27 PM   _____________   History of Present Illness     Amanda Strong is a 41 y.o. female with past medical history of hypertension who was referred to Dr. Alvester Chou with concerns for renal artery stenosis.  Reported she had struggled with uncontrolled hypertension for several years despite aggressive medication.  Underwent an outpatient renal artery Doppler study 05/12/2022 which showed a right renal aortic ratio of 3.9 and a left of 2.64.  Her right pole-to-pole dimension was 9.68 cm and the left was 12 cm.  She had a high velocity in the mid right renal artery suggesting the possibility of fibromuscular dysplasia.  Underwent CT angiogram which demonstrated a "beating" configuration of the mid to distal right renal artery suggestive of focal FMD.  She was recommended to undergo outpatient renal angiogram.  Hospital Course      Underwent successful right renal artery FMD PTA with presumed renal vessel hypertension.  Plan for DAPT with aspirin/Plavix.  No complications noted postprocedure.  Able to ambulate following morning.  Blood pressures did significantly improve postprocedure. Will stop amlodipine with continuation of home valsartan at discharge. Lipid panel noted LDL of 107, started on atorvastatin 74m daily.   General: Well developed, well nourished, female appearing in no acute distress. Head: Normocephalic, atraumatic.  Neck: Supple without bruits, JVD. Lungs:  Resp regular and unlabored, CTA. Heart: RRR, S1, S2, no S3, S4, or murmur; no rub. Abdomen: Soft, non-tender, non-distended with normoactive bowel sounds. No hepatomegaly. No rebound/guarding. No obvious abdominal masses. Extremities: No clubbing, cyanosis, edema. Distal pedal pulses are 2+ bilaterally. Right femoral cath site stable without bruising or hematoma Neuro: Alert and oriented X 3. Moves all extremities spontaneously. Psych: Normal affect.   Did the patient have an acute coronary syndrome (MI, NSTEMI, STEMI, etc) this admission?:  No                               Did the patient have a percutaneous coronary intervention (stent / angioplasty)?:  No.       _____________  Discharge Vitals Blood pressure (!) 128/90,  pulse 67, temperature 98.7 F (37.1 C), temperature source Oral, resp. rate 20, height $RemoveBe'5\' 7"'doEfLWSDf$  (1.702 m), weight 78.5 kg, SpO2 100 %.  Filed Weights   06/13/22 0930  Weight: 78.5 kg   General appearance: alert, cooperative, appears stated age, and no distress Neck: no carotid bruit and no JVD Lungs: clear to auscultation bilaterally and nonlabored, good air movement Heart: regular rate and rhythm, S1, S2 normal, no murmur, click, rub or gallop Abdomen: soft, non-tender; bowel sounds normal; no masses,  no organomegaly Extremities: extremities normal, atraumatic, no cyanosis or edema and right groin has some mild tenderness  but no ecchymosis or bruising.  No hematoma. Pulses: 2+ and symmetric Neurologic: Grossly normal   Labs & Radiologic Studies    CBC Recent Labs    06/14/22 0219  WBC 15.0*  HGB 13.0  HCT 38.2  MCV 89.9  PLT 675   Basic Metabolic Panel Recent Labs    06/14/22 0219  NA 136  K 3.7  CL 107  CO2 23  GLUCOSE 103*  BUN 8  CREATININE 0.73  CALCIUM 8.9   Liver Function Tests No results for input(s): "AST", "ALT", "ALKPHOS", "BILITOT", "PROT", "ALBUMIN" in the last 72 hours. No results for input(s): "LIPASE", "AMYLASE" in the last 72 hours. High Sensitivity Troponin:   No results for input(s): "TROPONINIHS" in the last 720 hours.  BNP Invalid input(s): "POCBNP" D-Dimer No results for input(s): "DDIMER" in the last 72 hours. Hemoglobin A1C No results for input(s): "HGBA1C" in the last 72 hours. Fasting Lipid Panel Recent Labs    06/14/22 0219  CHOL 161  HDL 42  LDLCALC 107*  TRIG 62  CHOLHDL 3.8   Thyroid Function Tests No results for input(s): "TSH", "T4TOTAL", "T3FREE", "THYROIDAB" in the last 72 hours.  Invalid input(s): "FREET3" _____________  PERIPHERAL VASCULAR CATHETERIZATION  Result Date: 06/13/2022 Images from the original result were not included.  [id number] LOCATION:  FACILITY: [facility] PHYSICIAN: [name], M.D. Nov 12, 1980 DATE OF PROCEDURE:  06/13/2022 DATE OF DISCHARGE: PV Angiogram/Intervention History obtained from chart review.Amanda Strong is a 41 y.o. moderately overweight single African-American female mother of 2 children (1 son and 1 daughter) who works as an Corporate treasurer at [employer].  She was referred by Dr. Jenna Luo for evaluation of renal vascular hypertension.  Hypertension is her only risk factor.  There is no family history for heart disease.  [name] never had heart attack or stroke.  [name] fairly active and is asymptomatic.  She had hypertension since the birth of her last child 77 years ago and it has been resistant on 2  medications recently.  Dr. Dennard Schaumann was going to add a third medication but decided to get a renal Doppler study 05/12/2022 revealing a right renal aortic ratio 3.9 and left of 2.64.  Her right pole-to-pole dimension was 9.68 cm and left was 12 cm.  The highest velocity appeared to be in the mid right renal artery suggesting the possibility of fibromuscular dysplasia.  She had an abdominal CTA which confirmed right renal artery FMD just prior to the distal bifurcation.  Because of this, she presents now for outpatient renal angiography potential intervention for "renal vessel hypertension". Pre Procedure Diagnosis: Renal vascular hypertension Post Procedure Diagnosis: Renal vessel hypertension Operators: Dr. [name] Procedures Performed:  1.  Right common femoral access  2.  Abdominal aortogram  3.  Selective right renal angiogram  4.  PTA right renal artery PROCEDURE DESCRIPTION: The patient was brought to  the second floor [facility] cath lab in the the postabsorptive state. She was premedicated with IV Versed and fentanyl. Her right groin was prepped and shaved in usual sterile fashion. Xylocaine 1% was used for local anesthesia. A 6 French sheath was inserted into the right common femoral artery using standard Seldinger technique.  A 5 French pigtail catheter was placed in the mid abdominal aorta.  Abdominal aortography was performed using 20 cc of Omnipaque dye 20 cc/s.  Following this selective right renal artery angiography was performed with a short 6 [place] JR4 guide catheter.  On the pigtail was used for the entirety of the case (130 cc total contrast the patient).  Retrograde ordered pressures monitored during the case.  Angiographic Data: 1: Abdominal aorta-widely patent without atherosclerotic changes.  There did appear to be changes of FMD in the distal main right renal artery 2: Right renal artery-FMD distal common right renal artery just before trifurcation   Ms. Brinton has FMD in her  distal main renal artery.  [name] on 3 antihypertensive medications and remains hypertensive.  Renal Doppler studies as well as abdominal CTA confirmed this.  Her right renal dimension is significantly smaller than her left.  We will proceed with renal artery PTA for presumed renal vascular hypertension. Procedure Description: The patient received a total of 8000 units of heparin with an initial ACT of 287 falling to 263 at the end of the case.  Using a 0.14 stabilizer wire I crossed the area of FMD into a inferior branch and then dilated the area of the FMD with a 4 mm x 2 cm balloon which appeared somewhat small.  I upsized to a 5 mm x 2 cm balloon which was right sized at 3 atm for 30 seconds resulting in improvement angiographically without evidence of dissection. Final Impression: Successful right renal artery FMD PTA for presumed renal vessel hypertension.  The patient received 300 mg of p.o. clopidogrel.  The sheath will be removed once ACT falls below 170 pressure held.  She will be hydrated overnight and discharged home in the morning.  We will get renal Dopplers on her in our [place] office next week and I will see her back the week after follow-up.  She left the lab in stable condition. [name]. MD, [facility] 06/13/2022 1:27 PM    CT ANGIO ABDOMEN PELVIS  W &/OR WO CONTRAST  Result Date: 05/27/2022 CLINICAL DATA:  41 year old female with a history of a hypertension and renal artery stenosis EXAM: CTA ABDOMEN AND PELVIS WITHOUT AND WITH CONTRAST TECHNIQUE: Multidetector CT imaging of the abdomen and pelvis was performed using the standard protocol during bolus administration of intravenous contrast. Multiplanar reconstructed images and MIPs were obtained and reviewed to evaluate the vascular anatomy. RADIATION DOSE REDUCTION: This exam was performed according to the departmental dose-optimization program which includes automated exposure control, adjustment of the mA and/or kV according to patient  size and/or use of iterative reconstruction technique. CONTRAST:  144mL OMNIPAQUE IOHEXOL 350 MG/ML SOLN COMPARISON:  Directed duplex of the renal arteries 05/12/2022 FINDINGS: VASCULAR Aorta: Unremarkable course, caliber, contour of the abdominal aorta. No dissection, aneurysm, or periaortic fluid. Celiac: Patent, with no significant atherosclerotic changes. SMA: Patent, with no significant atherosclerotic changes. Renals: - Right: On coronal images, there is questionable beading of the mid to distal main renal artery on the right, just proximal to the division into the rami (image 85, 86 of series 9). No accessory renal arteries identified. No significant  atherosclerotic changes. - Left: No atherosclerotic changes of the left renal artery. Unremarkable course caliber contour of the left renal artery. IMA: Inferior mesenteric artery is patent. Right lower extremity: Unremarkable course, caliber, and contour of the right iliac system. No aneurysm, dissection, or occlusion. Hypogastric artery is patent. Common femoral artery patent, with high bifurcation just beyond the inguinal ligament. Proximal SFA and profunda femoris patent. Left lower extremity: Unremarkable course, caliber, and contour of the left iliac system. No aneurysm, dissection, or occlusion. Hypogastric artery is patent. Common femoral artery patent. Proximal SFA and profunda femoris patent. Veins: Unremarkable appearance of the venous system. Review of the MIP images confirms the above findings. NON-VASCULAR Lower chest: No acute. Hepatobiliary: Unremarkable appearance of the liver. Unremarkable gall bladder. Pancreas: Unremarkable. Spleen: Unremarkable. Adrenals/Urinary Tract: - Right adrenal gland: Unremarkable - Left adrenal gland: Unremarkable. - Right kidney: No hydronephrosis, nephrolithiasis, inflammation, or ureteral dilation. Cranial caudal diameter measures 10.6 cm. No focal lesion. - Left Kidney: No hydronephrosis, nephrolithiasis,  inflammation, or ureteral dilation. Cranial caudal diameter measures 11.8 cm. No focal lesion. - Urinary Bladder: Unremarkable. Stomach/Bowel: - Stomach: Unremarkable. - Small bowel: Unremarkable - Appendix: Normal. - Colon: Unremarkable. Lymphatic: No adenopathy. Mesenteric: No free fluid or air. No mesenteric adenopathy. Reproductive: Intrauterine device in place. Otherwise unremarkable uterus/adnexa. Other: No hernia. Musculoskeletal: No evidence of acute fracture. No bony canal narrowing. No significant degenerative changes of the hips. IMPRESSION: The CT angiogram demonstrates questionable "beading" configuration of the mid-distal right renal artery, just proximal to the division of the rami, as above, suggesting focal FMD. Unremarkable CT angiogram of the left renal artery. Signed, Dulcy Fanny. Nadene Rubins, RPVI [facility] Electronically Signed   By: Corrie Mckusick D.O.   On: 05/27/2022 16:26    Disposition   Pt is being discharged home today in good condition.  Follow-up Plans & Appointments     Follow-up [facility] [place] Follow up on 06/24/2022.   Specialty: Cardiology Why: at 8:30am for your follow up renal dopplers, then follow up with Dr. Gwenlyn Found afterwards Contact information: [place] [place] [id number] [place] [phone]                 Discharge Medications   Allergies as of 06/14/2022       Reactions   Cephalexin    REACTION: intense itching all over   Percocet [oxycodone-acetaminophen] Itching        Medication List     STOP taking these medications    amLODipine 10 MG tablet Commonly known as: NORVASC       TAKE these medications    ALPRAZolam 0.5 MG tablet Commonly known as: XANAX Take 1 tablet (0.5 mg total) by mouth daily as needed for anxiety.   aspirin EC 81 MG  tablet Take 1 tablet (81 mg total) by mouth daily. Swallow whole.   atorvastatin 40 MG tablet Commonly known as: LIPITOR Take 1 tablet (40 mg total) by mouth daily.   clopidogrel 75 MG tablet Commonly known as: PLAVIX Take 1 tablet (75 mg total) by mouth daily with breakfast.   FLUoxetine 10 MG capsule Commonly known as: PROZAC Take one capsule daily for one week and than two capsule daily   lamoTRIgine 25 MG tablet Commonly known as: LaMICtal Taking one tab daily for one week and than two tab daily   lithium 300  MG tablet Take 1 tablet (300 mg total) by mouth daily.   Mirena (52 MG) 20 MCG/DAY Iud Generic drug: levonorgestrel Mirena 20 mcg/24 hours (8 yrs) 52 mg intrauterine device  Take 1 device every day by intrauterine route.   valsartan 320 MG tablet Commonly known as: DIOVAN Take 1 tablet (320 mg total) by mouth daily.           Outstanding Labs/Studies   Follow up renal dopplers  FLP/LFTs in 8 weeks   Duration of Discharge Encounter   Greater than 30 minutes including physician time.  Signed, Reino Bellis, NP 06/14/2022, 9:10 AM   ATTENDING ATTESTATION  I have seen, examined and evaluated the patient this morning along with Reino Bellis, NP-C.  After reviewing all the available data and chart, we discussed the patients laboratory, study & physical findings as well as symptoms in detail.  I agree with HER findings, examination as well as impression recommendations as per our discussion.    Attending adjustments noted in italics.   Doing well following renal artery PTA.  Blood pressures look well controlled.  Exam benign. Okay for discharge.   Leonie Man, MD, MS Glenetta Hew, M.D., M.S. Interventional Cardiologist  [place]  Pager # [phone] Phone # [phone] [address] [place], [place] [address]

## 2022-06-14 NOTE — Progress Notes (Signed)
Mobility Specialist - Progress Note   06/14/22 0910  Mobility  Activity Ambulated with assistance in hallway  Level of Assistance Standby assist, set-up cues, supervision of patient - no hands on  Assistive Device None  Distance Ambulated (ft) 330 ft  Activity Response Tolerated well  $Mobility charge 1 Mobility    Post-mobility:107 HR  Pt was received in bed and agreeable to mobility. No complaints throughout ambulation. Pt returned to bed with all needs met.  Larey Seat

## 2022-06-18 ENCOUNTER — Other Ambulatory Visit (HOSPITAL_COMMUNITY): Payer: Self-pay

## 2022-06-21 ENCOUNTER — Encounter (HOSPITAL_COMMUNITY): Payer: 59

## 2022-06-21 ENCOUNTER — Other Ambulatory Visit (HOSPITAL_COMMUNITY): Payer: Self-pay

## 2022-06-21 ENCOUNTER — Other Ambulatory Visit (HOSPITAL_COMMUNITY): Payer: Self-pay | Admitting: Psychiatry

## 2022-06-21 ENCOUNTER — Other Ambulatory Visit (HOSPITAL_COMMUNITY): Payer: Self-pay | Admitting: *Deleted

## 2022-06-21 DIAGNOSIS — F419 Anxiety disorder, unspecified: Secondary | ICD-10-CM

## 2022-06-21 DIAGNOSIS — F319 Bipolar disorder, unspecified: Secondary | ICD-10-CM

## 2022-06-21 MED ORDER — FLUOXETINE HCL 10 MG PO CAPS
20.0000 mg | ORAL_CAPSULE | Freq: Every day | ORAL | 0 refills | Status: DC
  Filled 2022-06-21: qty 30, 15d supply, fill #0

## 2022-06-24 ENCOUNTER — Other Ambulatory Visit: Payer: Self-pay | Admitting: Cardiovascular Disease

## 2022-06-24 ENCOUNTER — Ambulatory Visit (HOSPITAL_COMMUNITY)
Admission: RE | Admit: 2022-06-24 | Discharge: 2022-06-24 | Disposition: A | Discharge status: Home / Self Care | Payer: 59 | Source: Ambulatory Visit | Attending: Cardiovascular Disease | Admitting: Cardiovascular Disease

## 2022-06-24 DIAGNOSIS — I10 Essential (primary) hypertension: Secondary | ICD-10-CM | POA: Diagnosis not present

## 2022-06-28 ENCOUNTER — Ambulatory Visit: Payer: 59 | Attending: Cardiovascular Disease | Admitting: Cardiovascular Disease

## 2022-06-28 ENCOUNTER — Other Ambulatory Visit: Payer: Self-pay | Admitting: Cardiovascular Disease

## 2022-06-28 ENCOUNTER — Encounter (HOSPITAL_COMMUNITY): Payer: Self-pay | Admitting: Psychiatry

## 2022-06-28 ENCOUNTER — Other Ambulatory Visit (HOSPITAL_COMMUNITY): Payer: Self-pay

## 2022-06-28 ENCOUNTER — Encounter: Payer: Self-pay | Admitting: Cardiovascular Disease

## 2022-06-28 ENCOUNTER — Telehealth (HOSPITAL_BASED_OUTPATIENT_CLINIC_OR_DEPARTMENT_OTHER): Payer: 59 | Admitting: Psychiatry

## 2022-06-28 VITALS — BP 126/66 | HR 70 | Ht 67.0 in | Wt 176.2 lb

## 2022-06-28 DIAGNOSIS — I10 Essential (primary) hypertension: Secondary | ICD-10-CM | POA: Diagnosis not present

## 2022-06-28 DIAGNOSIS — F319 Bipolar disorder, unspecified: Secondary | ICD-10-CM

## 2022-06-28 DIAGNOSIS — I773 Arterial fibromuscular dysplasia: Secondary | ICD-10-CM

## 2022-06-28 DIAGNOSIS — F419 Anxiety disorder, unspecified: Secondary | ICD-10-CM

## 2022-06-28 DIAGNOSIS — I150 Renovascular hypertension: Secondary | ICD-10-CM | POA: Diagnosis not present

## 2022-06-28 MED ORDER — FLUOXETINE HCL 20 MG PO CAPS
20.0000 mg | ORAL_CAPSULE | Freq: Every day | ORAL | 1 refills | Status: DC
  Filled 2022-06-28: qty 30, 30d supply, fill #0
  Filled 2022-08-04: qty 30, 30d supply, fill #1

## 2022-06-28 MED ORDER — LITHIUM CARBONATE 300 MG PO TABS
300.0000 mg | ORAL_TABLET | Freq: Every day | ORAL | 1 refills | Status: DC
  Filled 2022-06-28 – 2022-07-06 (×2): qty 30, 30d supply, fill #0

## 2022-06-28 NOTE — Assessment & Plan Note (Signed)
Ms. Orona returns today after her recent renal intervention which I performed 06/13/2022 for right renal artery FMD and presumed renal vessel hypertension.  She was on amlodipine and high-dose valsartan.  She initially had renal Dopplers and CTA that suggested right renal artery FMD.  This was confirmed by renal angiography.  I performed PTA with a 5 mm balloon successfully.  She was discharged home the following day.  Her groin is healed.  Renal Dopplers have improved and actually her renal dimension is grown from 9.7 cm up to 10.5 cm.  Her right renal velocity elevation has normalized.  Her blood pressure is improved as well.  She no longer takes her amlodipine and she checks her blood pressures at home.  We will continue to follow her renal Doppler studies in 6 months.  Must go to get carotid Doppler studies to rule out carotid FMD.
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
## 2022-06-28 NOTE — Patient Instructions (Signed)
Medication Instructions:  Your physician recommends that you continue on your current medications as directed. Please refer to the Current Medication list given to you today.  *If you need a refill on your cardiac medications before your next appointment, please call your pharmacy*   Testing/Procedures: Your physician has requested that you have a carotid duplex. This test is an ultrasound of the carotid arteries in your neck. It looks at blood flow through these arteries that supply the brain with blood. Allow one hour for this exam. There are no restrictions or special instructions. This procedure will be done at [place]  Your physician has requested that you have a renal artery duplex. During this test, an ultrasound is used to evaluate blood flow to the kidneys. Allow one hour for this exam. Do not eat after midnight the day before and avoid carbonated beverages. Take your medications as you usually do. To be done in March 2024. This procedure will be done at [place]. [address]   Follow-Up: At [facility], you and your health needs are our priority.  As part of our continuing mission to provide you with exceptional heart care, we have created designated Provider Care Teams.  These Care Teams include your primary Cardiologist (physician) and Advanced Practice Providers (APPs -  Physician Assistants and Nurse Practitioners) who all work together to provide you with the care you need, when you need it.  We recommend signing up for the patient portal called "MyChart".  Sign up information is provided on this After Visit Summary.  MyChart is used to connect with patients for Virtual Visits (Telemedicine).  Patients are able to view lab/test results, encounter notes, upcoming appointments, etc.  Non-urgent messages can be sent to your provider as well.   To learn more about what you can do with MyChart, go to [email or web address].    Your next appointment:    6 month(s)  The format for your next appointment:   In Person  Provider:   [name], MD

## 2022-06-28 NOTE — Progress Notes (Signed)
06/28/2022 Amanda Strong   1981-05-28  [id number]  Primary Physician Amanda Schaumann [name], MD Primary Cardiologist: Amanda Harp MD Amanda Strong, [place]  HPI:  Amanda Strong is a 41 y.o.  moderately overweight single African-American female mother of 2 children (1 son and 1 daughter) who works as an Corporate treasurer at [employer].  She was referred by Dr. Jenna Strong for evaluation of renal vascular hypertension.  I last saw her in the office 05/18/2022.  Hypertension is her only risk factor.  There is no family history for heart disease.  [name] never had heart attack or stroke.  [name] fairly active and is asymptomatic.  She had hypertension since the birth of her last child 50 years ago and it has been resistant on 2 medications recently.  Dr. Dennard Schaumann was going to add a third medication but decided to get a renal Doppler study 05/12/2022 revealing a right renal aortic ratio 3.9 and left of 2.64.  Her right pole-to-pole dimension was 9.68 cm and left was 12 cm.  The highest velocity appeared to be in the mid right renal artery suggesting the possibility of fibromuscular dysplasia.  I performed renal angiography on her 06/13/2022 revealing FMD in the mid to distal right renal artery just before bifurcation.  I performed PTA with a 5 mm balloon.  She was discharged home the following day.  She has since discontinued amlodipine and has excellent blood pressure control already on valsartan.  Her follow-up renal Doppler study performed 06/24/2022 revealed normalization of her elevated renal artery velocities on the right.  Her right kidney dimensions grew from 9.7 cm on 05/08/2022 up to 10.5 cm.   Current Meds  Medication Sig   ALPRAZolam (XANAX) 0.5 MG tablet Take 1 tablet (0.5 mg total) by mouth daily as needed for anxiety.   aspirin EC 81 MG tablet Take 1 tablet (81 mg total) by mouth daily. Swallow whole.   atorvastatin (LIPITOR) 40 MG tablet Take 1 tablet (40 mg total) by mouth  daily.   clopidogrel (PLAVIX) 75 MG tablet Take 1 tablet (75 mg total) by mouth daily with breakfast.   FLUoxetine (PROZAC) 10 MG capsule Take 2 capsules (20 mg total) by mouth daily.   levonorgestrel (MIRENA, 52 MG,) 20 MCG/DAY IUD 1 each by Intrauterine route once.   lithium 300 MG tablet Take 1 tablet (300 mg total) by mouth daily.   valsartan (DIOVAN) 320 MG tablet Take 1 tablet (320 mg total) by mouth daily.     Allergies  Allergen Reactions   Cephalexin     REACTION: intense itching all over   Lamictal [Lamotrigine] Hives and Itching   Oxycodone Itching    Social History   Socioeconomic History   Marital status: Single    Spouse name: Not on file   Number of children: 1   Years of education: 16   Highest education level: Not on file  Occupational History   Occupation: WOUND CARE NURSES    Employer: [employer]  Tobacco Use   Smoking status: Former    Types: Cigarettes    Quit date: 10/22/2008    Years since quitting: 13.6   Smokeless tobacco: Never  Vaping Use   Vaping Use: Never used  Substance and Sexual Activity   Alcohol use: No    Alcohol/week: 0.0 standard drinks of alcohol    Comment: occasionally   Drug use: No   Sexual activity: Yes  Partners: Male    Birth control/protection: Condom, I.U.D.  Other Topics Concern   Not on file  Social History Narrative   Caffeine Use-yes   Regular exercise-no      Entered 03/2014:   Single Mom of 2 children   Children are:      Girl--56 months old      Boy--41 years old   She works at [employer].    Currently works with wound care   Social Determinants of Health   Financial Resource Strain: Not on file  Food Insecurity: Not on file  Transportation Needs: Not on file  Physical Activity: Not on file  Stress: Not on file  Social Connections: Not on file  Intimate Partner Violence: Not on file     Review of Systems: General: negative for chills, fever, night sweats or weight  changes.  Cardiovascular: negative for chest pain, dyspnea on exertion, edema, orthopnea, palpitations, paroxysmal nocturnal dyspnea or shortness of breath Dermatological: negative for rash Respiratory: negative for cough or wheezing Urologic: negative for hematuria Abdominal: negative for nausea, vomiting, diarrhea, bright red blood per rectum, melena, or hematemesis Neurologic: negative for visual changes, syncope, or dizziness All other systems reviewed and are otherwise negative except as noted above.    Blood pressure 126/66, pulse 70, height 5\' 7"  (1.702 m), weight 176 lb 3.2 oz (79.9 kg), SpO2 98 %.  General appearance: alert and no distress Neck: no adenopathy, no carotid bruit, no JVD, supple, symmetrical, trachea midline, and thyroid not enlarged, symmetric, no tenderness/mass/nodules Lungs: clear to auscultation bilaterally Heart: regular rate and rhythm, S1, S2 normal, no murmur, click, rub or gallop Extremities: extremities normal, atraumatic, no cyanosis or edema Pulses: 2+ and symmetric Skin: Skin color, texture, turgor normal. No rashes or lesions Neurologic: Grossly normal  EKG sinus rhythm at 70 without ST or T wave changes.  Personally reviewed this EKG.  ASSESSMENT AND PLAN:   Renovascular hypertension Amanda Strong returns today after her recent renal intervention which I performed 06/13/2022 for right renal artery FMD and presumed renal vessel hypertension.  She was on amlodipine and high-dose valsartan.  She initially had renal Dopplers and CTA that suggested right renal artery FMD.  This was confirmed by renal angiography.  I performed PTA with a 5 mm balloon successfully.  She was discharged home the following day.  Her groin is healed.  Renal Dopplers have improved and actually her renal dimension is grown from 9.7 cm up to 10.5 cm.  Her right renal velocity elevation has normalized.  Her blood pressure is improved as well.  She no longer takes her amlodipine and she  checks her blood pressures at home.  We will continue to follow her renal Doppler studies in 6 months.  Must go to get carotid Doppler studies to rule out carotid FMD.     06/15/2022 MD [facility], [facility] 06/28/2022 9:53 AM

## 2022-07-06 ENCOUNTER — Ambulatory Visit (HOSPITAL_COMMUNITY)
Admission: RE | Admit: 2022-07-06 | Discharge: 2022-07-06 | Disposition: A | Discharge status: Home / Self Care | Payer: 59 | Source: Ambulatory Visit | Attending: Cardiovascular Disease | Admitting: Cardiovascular Disease

## 2022-07-06 ENCOUNTER — Other Ambulatory Visit (HOSPITAL_COMMUNITY): Payer: Self-pay

## 2022-07-06 DIAGNOSIS — I10 Essential (primary) hypertension: Secondary | ICD-10-CM | POA: Diagnosis not present

## 2022-07-06 DIAGNOSIS — I773 Arterial fibromuscular dysplasia: Secondary | ICD-10-CM | POA: Diagnosis not present

## 2022-07-07 ENCOUNTER — Other Ambulatory Visit (HOSPITAL_COMMUNITY): Payer: Self-pay

## 2022-07-08 ENCOUNTER — Other Ambulatory Visit (HOSPITAL_COMMUNITY): Payer: Self-pay

## 2022-08-04 ENCOUNTER — Other Ambulatory Visit (HOSPITAL_COMMUNITY): Payer: Self-pay

## 2022-08-04 ENCOUNTER — Telehealth (HOSPITAL_COMMUNITY): Payer: Self-pay | Admitting: *Deleted

## 2022-08-04 DIAGNOSIS — F419 Anxiety disorder, unspecified: Secondary | ICD-10-CM

## 2022-08-04 MED ORDER — ALPRAZOLAM 0.5 MG PO TABS: ORAL_TABLET | ORAL | 0 refills | Status: DC

## 2022-08-04 NOTE — Telephone Encounter (Signed)
Done

## 2022-08-04 NOTE — Telephone Encounter (Signed)
Pt called requesting a refill of Xanax 0.5 mg. Last sent to [facility] on [address] on 06/29/22 for #20. Pt las tvisit 06/28/22 and she has a f/u scheduled for 08/29/22. Please review and advise.

## 2022-08-12 ENCOUNTER — Other Ambulatory Visit (HOSPITAL_COMMUNITY): Payer: Self-pay

## 2022-08-29 ENCOUNTER — Telehealth (HOSPITAL_BASED_OUTPATIENT_CLINIC_OR_DEPARTMENT_OTHER): Payer: 59 | Admitting: Psychiatry

## 2022-08-29 ENCOUNTER — Encounter (HOSPITAL_COMMUNITY): Payer: Self-pay | Admitting: Psychiatry

## 2022-08-29 DIAGNOSIS — F319 Bipolar disorder, unspecified: Secondary | ICD-10-CM

## 2022-08-29 DIAGNOSIS — F419 Anxiety disorder, unspecified: Secondary | ICD-10-CM | POA: Diagnosis not present

## 2022-08-29 MED ORDER — FLUOXETINE HCL 20 MG PO CAPS: 20.0000 mg | ORAL_CAPSULE | Freq: Every day | ORAL | 2 refills | Status: DC

## 2022-08-29 MED ORDER — LITHIUM CARBONATE 300 MG PO TABS: 300.0000 mg | ORAL_TABLET | Freq: Every day | ORAL | 2 refills | Status: DC

## 2022-08-29 NOTE — Progress Notes (Signed)
Virtual Visit via Telephone Note  I connected with Amanda Strong on 08/29/22 at  3:20 PM EST by telephone and verified that I am speaking with the correct person using two identifiers.  Location: Patient: Work Provider: Biomedical scientist   I discussed the limitations, risks, security and privacy concerns of performing an evaluation and management service by telephone and the availability of in person appointments. I also discussed with the patient that there may be a patient responsible charge related to this service. The patient expressed understanding and agreed to proceed.   History of Present Illness: Patient is evaluated by phone session.  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  [name] going to apply places to get a better job and money.  Overall she feels things are going well.  [name] only taking lithium 1 pill because she tried to end that cause headaches.  She recently had a vacation to [place] after a long time and she really enjoyed time with the kids.  Patient has 2 sons.  One is Paramedic and other is in fourth grade.  Her job stressful but going well.  She denies any mania, psychosis, crying spells or any feeling of hopelessness or worthlessness.  She has no tremors or shakes.  She admitted not seeing the therapist because it was too much and her job routine did not allow to have time for therapy.  Her appetite is okay.  She takes Xanax only as needed which helps.  She does not need a new prescription.  Past Psychiatric History:  H/O mood swing, anger, depression, impulsive behavior, throwing things, damaging and punching walls, speeding tickets and THC use. H/O passive suicidal thoughts, emotional and verbal abuse. No h/o mania, psychosis, inpatient or suicidal attempt. Saw Gala Murdoch and tried Klonopin, Cymbalta, Zoloft, Lexapro, Prestiq, hydroxyzine, prozac and Seroquel (Too sleepy). Lamictal caused rash, paxila nd abilify did not work after some  time.     Psychiatric Specialty Exam: Physical Exam  Review of Systems  Weight 180 lb (81.6 kg).There is no height or weight on file to calculate BMI.  General Appearance: NA  Eye Contact:  NA  Speech:  Clear and Coherent  Volume:  Normal  Mood:  Euthymic  Affect:  NA  Thought Process:  Goal Directed  Orientation:  Full (Time, Place, and Person)  Thought Content:  WDL  Suicidal Thoughts:  No  Homicidal Thoughts:  No  Memory:  Immediate;   Good Recent;   Good Remote;   Good  Judgement:  Fair  Insight:  Shallow  Psychomotor Activity:  NA  Concentration:  Concentration: Good and Attention Span: Good  Recall:  Good  Fund of Knowledge:  Good  Language:  Good  Akathisia:  No  Handed:  Right  AIMS (if indicated):     Assets:  Communication Skills Desire for Improvement Housing Resilience Social Support Talents/Skills Transportation  ADL's:  Intact  Cognition:  WNL  Sleep:   ok      Assessment and Plan: Bipolar disorder type I.  Anxiety.  Patient doing better on her current medications.  Patient tried lithium 2 a day but could not handle the headaches and she is taking only 300 mg a day.  Prozac 20 mg daily.  She does not need a new prescription of Xanax which she takes only as needed.  Congratulations given on passing her boards and now she is Therapist, sports and looking for a better job.  Discussed medication side effects and benefits.  Recommend to call us back if she has any question or any concern.  Follow-up in 3 months.  Follow Up Instructions:    I discussed the assessment and treatment plan with the patient. The patient was provided an opportunity to ask questions and all were answered. The patient agreed with the plan and demonstrated an understanding of the instructions.   The patient was advised to call back or seek an in-person evaluation if the symptoms worsen or if the condition fails to improve as anticipated.  Collaboration of Care: Other provider involved in  patient's care AEB notes are available in epic to review.  Patient/Guardian was advised Release of Information must be obtained prior to any record release in order to collaborate their care with an outside provider. Patient/Guardian was advised if they have not already done so to contact the registration department to sign all necessary forms in order for [place] to release information regarding their care.   Consent: Patient/Guardian gives verbal consent for treatment and assignment of benefits for services provided during this visit. Patient/Guardian expressed understanding and agreed to proceed.    I provided 11 minutes of non-face-to-face time during this encounter.   Cleotis Nipper, MD

## 2022-09-06 DIAGNOSIS — Z8632 Personal history of gestational diabetes: Secondary | ICD-10-CM | POA: Diagnosis not present

## 2022-09-06 DIAGNOSIS — Z113 Encounter for screening for infections with a predominantly sexual mode of transmission: Secondary | ICD-10-CM | POA: Diagnosis not present

## 2022-09-06 DIAGNOSIS — Z30431 Encounter for routine checking of intrauterine contraceptive device: Secondary | ICD-10-CM | POA: Diagnosis not present

## 2022-09-06 DIAGNOSIS — N915 Oligomenorrhea, unspecified: Secondary | ICD-10-CM | POA: Diagnosis not present

## 2022-09-06 DIAGNOSIS — Z1231 Encounter for screening mammogram for malignant neoplasm of breast: Secondary | ICD-10-CM | POA: Diagnosis not present

## 2022-09-06 DIAGNOSIS — Z1389 Encounter for screening for other disorder: Secondary | ICD-10-CM | POA: Diagnosis not present

## 2022-09-06 DIAGNOSIS — Z202 Contact with and (suspected) exposure to infections with a predominantly sexual mode of transmission: Secondary | ICD-10-CM | POA: Diagnosis not present

## 2022-09-06 DIAGNOSIS — Z01419 Encounter for gynecological examination (general) (routine) without abnormal findings: Secondary | ICD-10-CM | POA: Diagnosis not present

## 2022-09-09 ENCOUNTER — Telehealth (HOSPITAL_COMMUNITY): Payer: Self-pay | Admitting: *Deleted

## 2022-09-09 NOTE — Telephone Encounter (Signed)
Pt called requesting a refill of Xanax 0.5 mg. Last script called in on 08/04/22 for #20. Pt next appointment scheduled for 11/28/22. Please review and advise.

## 2022-09-13 ENCOUNTER — Other Ambulatory Visit (HOSPITAL_COMMUNITY): Payer: Self-pay | Admitting: Psychiatry

## 2022-09-13 DIAGNOSIS — F319 Bipolar disorder, unspecified: Secondary | ICD-10-CM

## 2022-09-13 DIAGNOSIS — F419 Anxiety disorder, unspecified: Secondary | ICD-10-CM

## 2022-09-16 ENCOUNTER — Ambulatory Visit
Admission: RE | Admit: 2022-09-16 | Discharge: 2022-09-16 | Disposition: A | Discharge status: Home / Self Care | Payer: 59 | Source: Ambulatory Visit | Attending: Family Medicine | Admitting: Family Medicine

## 2022-09-16 ENCOUNTER — Ambulatory Visit: Payer: 59 | Admitting: Family Medicine

## 2022-09-16 ENCOUNTER — Ambulatory Visit
Admission: RE | Admit: 2022-09-16 | Discharge: 2022-09-16 | Disposition: A | Discharge status: Home / Self Care | Payer: 59 | Attending: Family Medicine | Admitting: Family Medicine

## 2022-09-16 VITALS — BP 126/78 | HR 91 | Ht 67.0 in | Wt 180.0 lb

## 2022-09-16 DIAGNOSIS — M25562 Pain in left knee: Secondary | ICD-10-CM | POA: Insufficient documentation

## 2022-09-16 NOTE — Progress Notes (Signed)
Subjective:    Patient ID: Amanda Strong, female    DOB: 1981/01/15, 41 y.o.   MRN: [id number]  Patient is a very sweet 41 year old female who presents today with left lateral knee pain.  She denies any specific injury.  She does work as a Engineer, civil (consulting) so she is on her feet for more than 10 hours a day.  She reports a gradual onset of an aching pain over the lateral aspect posterior aspect of her knee.  It aches and throbs.  At times she has an effusion.  Sometimes she feels like her leg is going to give out on her.  She denies any locking or catching in the knee.  There is no laxity to varus or valgus stress.  Unfortunately she is on Plavix due to renal artery stenosis so we are unable to use NSAIDs.  She has not been taking Tylenol. Past Medical History:  Diagnosis Date   Abnormal Pap smear    Acne    Allergy    Anxiety    Depression    Group B streptococcal infection in pregnancy 10/28/2012   History of chicken pox    Hypertension    Hyperthyroidism    LGSIL (low grade squamous intraepithelial dysplasia)    Dr. Senaida Ores   Normal pregnancy, repeat 10/28/2012   Past Surgical History:  Procedure Laterality Date   BUNIONECTOMY     FOOT SURGERY     PERIPHERAL VASCULAR BALLOON ANGIOPLASTY  06/13/2022   Procedure: PERIPHERAL VASCULAR BALLOON ANGIOPLASTY;  Surgeon: Runell Gess, MD;  Location: [facility] INVASIVE [facility];  Service: Cardiovascular;;   RENAL ANGIOGRAPHY N/A 06/13/2022   Procedure: RENAL ANGIOGRAPHY;  Surgeon: Runell Gess, MD;  Location: [facility] INVASIVE CV LAB;  Service: Cardiovascular;  Laterality: N/A;   WISDOM TOOTH EXTRACTION     Current Outpatient Medications on File Prior to Visit  Medication Sig Dispense Refill   ALPRAZolam (XANAX) 0.5 MG tablet TAKE 1 TABLET BY MOUTH EVERY DAY AS NEEDED FOR ANXIETY 20 tablet 0   aspirin EC 81 MG tablet Take 1 tablet (81 mg total) by mouth daily. Swallow whole. 90 tablet 1   atorvastatin (LIPITOR) 40 MG tablet Take 1 tablet (40 mg total)  by mouth daily. 90 tablet 0   clopidogrel (PLAVIX) 75 MG tablet Take 1 tablet (75 mg total) by mouth daily with breakfast. 90 tablet 1   FLUoxetine (PROZAC) 20 MG capsule Take 1 capsule (20 mg total) by mouth daily. 30 capsule 2   levonorgestrel (MIRENA, 52 MG,) 20 MCG/DAY IUD 1 each by Intrauterine route once.     valsartan (DIOVAN) 320 MG tablet Take 1 tablet (320 mg total) by mouth daily. 90 tablet 3   [DISCONTINUED] desvenlafaxine (PRISTIQ) 50 MG 24 hr tablet Take 1 tablet (50 mg total) by mouth daily. 21 tablet 0   No current facility-administered medications on file prior to visit.   Allergies  Allergen Reactions   Cephalexin     REACTION: intense itching all over   Lamictal [Lamotrigine] Hives and Itching   Oxycodone Itching   Social History   Socioeconomic History   Marital status: Single    Spouse name: Not on file   Number of children: 1   Years of education: 16   Highest education level: Not on file  Occupational History   Occupation: WOUND CARE NURSES    Employer: [employer]  Tobacco Use   Smoking status: Former    Types: Cigarettes  Quit date: 10/22/2008    Years since quitting: 13.9   Smokeless tobacco: Never  Vaping Use   Vaping Use: Never used  Substance and Sexual Activity   Alcohol use: No    Alcohol/week: 0.0 standard drinks of alcohol    Comment: occasionally   Drug use: No   Sexual activity: Yes    Partners: Male    Birth control/protection: Condom, I.U.D.  Other Topics Concern   Not on file  Social History Narrative   Caffeine Use-yes   Regular exercise-no      Entered 03/2014:   Single Mom of 2 children   Children are:      Girl--37 months old      Boy--41 years old   She works at [employer].    Currently works with wound care   Social Determinants of Health   Financial Resource Strain: Not on file  Food Insecurity: Not on file  Transportation Needs: Not on file  Physical Activity: Not on file  Stress: Not  on file  Social Connections: Not on file  Intimate Partner Violence: Not on file      Review of Systems  All other systems reviewed and are negative.      Objective:   Physical Exam Vitals reviewed.  Constitutional:      General: [name] not in acute distress.    Appearance: Normal appearance. [name] not ill-appearing or toxic-appearing.  Cardiovascular:     Rate and Rhythm: Normal rate and regular rhythm.     Pulses: Normal pulses.     Heart sounds: Normal heart sounds. No murmur heard.    No friction rub. No gallop.  Pulmonary:     Effort: Pulmonary effort is normal. No respiratory distress.     Breath sounds: No stridor. No wheezing, rhonchi or rales.  Musculoskeletal:     Left knee: Bony tenderness present. No deformity, effusion or erythema. Tenderness present over the lateral joint line. Abnormal meniscus.     Right lower leg: No edema.     Left lower leg: No edema.  Neurological:     Mental Status: [name] alert.          Assessment & Plan:   Acute pain of left knee - Plan: DG Knee Complete 4 Views Left  I suspect osteoarthritis versus a meniscal tear.  Obtain an x-ray of the left knee.  Avoid NSAIDs due to the fact she is on Plavix and has renal artery stenosis.  Recommended a knee brace and Tylenol for symptomatic relief.  If x-ray is unremarkable, consider an MRI to evaluate for meniscal tear.  Patient declines cortisone injection as the pain is not severe

## 2022-09-21 ENCOUNTER — Other Ambulatory Visit: Payer: Self-pay | Admitting: Cardiology

## 2022-09-22 ENCOUNTER — Telehealth (HOSPITAL_COMMUNITY): Payer: Self-pay

## 2022-09-22 ENCOUNTER — Other Ambulatory Visit (HOSPITAL_COMMUNITY): Payer: Self-pay

## 2022-09-22 DIAGNOSIS — F419 Anxiety disorder, unspecified: Secondary | ICD-10-CM

## 2022-09-22 MED ORDER — ALPRAZOLAM 0.5 MG PO TABS: ORAL_TABLET | ORAL | 0 refills | Status: DC

## 2022-09-22 MED ORDER — ATORVASTATIN CALCIUM 40 MG PO TABS
40.0000 mg | ORAL_TABLET | Freq: Every day | ORAL | 0 refills | Status: DC
  Filled 2022-09-22 (×2): qty 90, 90d supply, fill #0

## 2022-09-22 NOTE — Telephone Encounter (Signed)
Prescription sent to the pharmacy at [address].  Please inform the patient

## 2022-09-22 NOTE — Telephone Encounter (Signed)
Patient called to request a refill for the following medication  Last visit 08/29/22 Next visit 11/28/22    Disp Refills Start End   ALPRAZolam (XANAX) 0.5 MG tablet 20 tablet 0 08/04/2022    Sig: TAKE 1 TABLET BY MOUTH EVERY DAY AS NEEDED FOR ANXIETY   Sent to pharmacy as: ALPRAZolam Duanne Moron) 0.5 MG tablet   E-Prescribing Status: Receipt confirmed by pharmacy (08/04/2022  3:53 PM EST)     Preferred pharmacy [id number] [place], [place] - [address] [place] Phone: [phone]  Fax: [phone]

## 2022-09-23 ENCOUNTER — Other Ambulatory Visit (HOSPITAL_COMMUNITY): Payer: Self-pay

## 2022-11-22 ENCOUNTER — Telehealth (HOSPITAL_COMMUNITY): Payer: Self-pay

## 2022-11-22 DIAGNOSIS — F419 Anxiety disorder, unspecified: Secondary | ICD-10-CM

## 2022-11-22 MED ORDER — ALPRAZOLAM 0.5 MG PO TABS: ORAL_TABLET | ORAL | 0 refills | Status: DC

## 2022-11-22 NOTE — Telephone Encounter (Signed)
[facility]

## 2022-11-22 NOTE — Telephone Encounter (Signed)
Medication refill request - Call message from patient requesting a new Alprazolam order, last provided 09/22/22 with no refills. Patient stated she has new insurance and is able to fill again and requests this be [facility] to [facility] [address].  Patient returns for her next appointment on 11/28/22.

## 2022-11-23 NOTE — Telephone Encounter (Signed)
Medication management - Attempted to contact patient back to inform Dr. Adele Schilder has sent in her new Alprazolam order, as requested to [facility] on [address], however pt did not answer and could not leave a voice mail due to box being full.

## 2022-11-28 ENCOUNTER — Encounter (HOSPITAL_COMMUNITY): Payer: Self-pay | Admitting: Psychiatry

## 2022-11-28 ENCOUNTER — Ambulatory Visit (HOSPITAL_COMMUNITY): Payer: Self-pay

## 2022-11-28 ENCOUNTER — Telehealth (HOSPITAL_BASED_OUTPATIENT_CLINIC_OR_DEPARTMENT_OTHER): Payer: Self-pay | Admitting: Psychiatry

## 2022-11-28 DIAGNOSIS — F319 Bipolar disorder, unspecified: Secondary | ICD-10-CM

## 2022-11-28 DIAGNOSIS — F419 Anxiety disorder, unspecified: Secondary | ICD-10-CM

## 2022-11-28 MED ORDER — FLUOXETINE HCL 20 MG PO CAPS: 20.0000 mg | ORAL_CAPSULE | Freq: Every day | ORAL | 0 refills | Status: DC

## 2022-11-28 MED ORDER — ALPRAZOLAM 0.5 MG PO TABS: ORAL_TABLET | ORAL | 0 refills | Status: DC

## 2022-11-28 NOTE — Progress Notes (Signed)
Amanda Strong   Patient Location: Work Provider Location: Home Office  I connect with patient by video and verified that I am speaking with correct person by using two identifiers. I discussed the limitations of evaluation and management by telemedicine and the availability of in person appointments. I also discussed with the patient that there may be a patient responsible charge related to this service. The patient expressed understanding and agreed to proceed.  Amanda Strong TA:[id number] 42 y.o.  11/28/2022 2:23 PM  History of Present Illness:  Patient is evaluated by video session.  [name] doing fine on her medication.  She stopped the lithium after she could not tolerate very well.  [name] taking Prozac 20 mg every day and occasionally Xanax when she feels very nervous or anxious.  Her job is going okay.  Sometimes it is stressful but manageable.  [name] working at [employer].  She reported her family life is okay.  Her oldest is 42 year old and she is hoping she go to college after finished high school.  Her other son is in fourth grade and some time his behavior is challenging but manageable.  She denies any crying spells, feeling of hopelessness, mania, psychosis or any hallucination.  She has not seen therapist because she is too busy at work.  Recently she had x-rays done for the knee joint because of the pain and she is using the braces.  Patient denies drinking or using any illegal substances.  Her appetite is okay.  Her sleep is good.  Past Psychiatric History: H/O mood swing, anger, depression, impulsive behavior, throwing things, damaging and punching walls, speeding tickets and THC use. H/O passive suicidal thoughts, emotional and verbal abuse. No h/o mania, psychosis, inpatient or suicidal attempt. Saw Gala Murdoch and tried Klonopin, Cymbalta, Zoloft, Lexapro, Prestiq, hydroxyzine, prozac and Seroquel (Too sleepy). Lamictal caused rash,  paxila and abilify did not work after some time.   Lithium could not tolerate.   Outpatient Encounter Medications as of 11/28/2022  Medication Sig   ALPRAZolam (XANAX) 0.5 MG tablet TAKE 1 TABLET BY MOUTH EVERY DAY AS NEEDED FOR ANXIETY   aspirin EC 81 MG tablet Take 1 tablet (81 mg total) by mouth daily. Swallow whole.   atorvastatin (LIPITOR) 40 MG tablet Take 1 tablet (40 mg total) by mouth daily.   clopidogrel (PLAVIX) 75 MG tablet Take 1 tablet (75 mg total) by mouth daily with breakfast.   FLUoxetine (PROZAC) 20 MG capsule Take 1 capsule (20 mg total) by mouth daily.   levonorgestrel (MIRENA, 52 MG,) 20 MCG/DAY IUD 1 each by Intrauterine route once.   valsartan (DIOVAN) 320 MG tablet Take 1 tablet (320 mg total) by mouth daily.   [DISCONTINUED] desvenlafaxine (PRISTIQ) 50 MG 24 hr tablet Take 1 tablet (50 mg total) by mouth daily.   No facility-administered encounter medications on file as of 11/28/2022.    No results found for this or any previous visit (from the past 2160 hour(s)).   Psychiatric Specialty Exam: Physical Exam  Review of Systems  Weight 180 lb (81.6 kg).There is no height or weight on file to calculate BMI.  General Appearance: Casual  Eye Contact:  Good  Speech:  Clear and Coherent  Volume:  Normal  Mood:  Euthymic  Affect:  Appropriate  Thought Process:  Goal Directed  Orientation:  Full (Time, Place, and Person)  Thought Content:  WDL  Suicidal Thoughts:  No  Homicidal Thoughts:  No  Memory:  Immediate;   Good Recent;   Good Remote;   Good  Judgement:  Good  Insight:  Good  Psychomotor Activity:  Normal  Concentration:  Concentration: Good and Attention Span: Good  Recall:  Good  Fund of Knowledge:  Good  Language:  Good  Akathisia:  No  Handed:  Right  AIMS (if indicated):     Assets:  Communication Skills Desire for Improvement Housing Resilience Social Support Talents/Skills Transportation  ADL's:  Intact  Cognition:  WNL  Sleep:  ok      Assessment/Plan: Anxiety - Plan: FLUoxetine (PROZAC) 20 MG capsule, ALPRAZolam (XANAX) 0.5 MG tablet  Bipolar I disorder (HCC) - Plan: FLUoxetine (PROZAC) 20 MG capsule  Patient is stable on her current medication.  [name] not taking lithium after she could not tolerate.  She has the diagnosis of renal artery stenosis and she is seeing doctors and had upcoming appointment for blood work.  She reported her blood pressure is manageable after she was given the diagnosis and managed.  Continue Prozac 20 mg daily and Xanax 0.5 mg to take as needed for severe anxiety or panic attack.  Recommend to call us back if she has any question or any concern.  Follow-up in 3 months.   Follow Up Instructions:     I discussed the assessment and treatment plan with the patient. The patient was provided an opportunity to ask questions and all were answered. The patient agreed with the plan and demonstrated an understanding of the instructions.   The patient was advised to call back or seek an in-person evaluation if the symptoms worsen or if the condition fails to improve as anticipated.    Collaboration of Care: Other provider involved in patient's care AEB notes her billable in epic to review.  Patient/Guardian was advised Release of Information must be obtained prior to any record release in order to collaborate their care with an outside provider. Patient/Guardian was advised if they have not already done so to contact the registration department to sign all necessary forms in order for [place] to release information regarding their care.   Consent: Patient/Guardian gives verbal consent for treatment and assignment of benefits for services provided during this visit. Patient/Guardian expressed understanding and agreed to proceed.     I provided 20 minutes of non face to face time during this encounter.  Kathlee Nations, MD 11/28/2022

## 2022-12-02 ENCOUNTER — Encounter: Payer: Self-pay | Admitting: Family Medicine

## 2022-12-02 ENCOUNTER — Ambulatory Visit: Payer: Self-pay | Admitting: Cardiovascular Disease

## 2022-12-03 ENCOUNTER — Emergency Department (HOSPITAL_BASED_OUTPATIENT_CLINIC_OR_DEPARTMENT_OTHER)
Admission: EM | Admit: 2022-12-03 | Discharge: 2022-12-03 | Disposition: A | Discharge status: Home / Self Care | Payer: BC Managed Care – PPO | Attending: Emergency Medicine | Admitting: Emergency Medicine

## 2022-12-03 ENCOUNTER — Other Ambulatory Visit: Payer: Self-pay

## 2022-12-03 ENCOUNTER — Emergency Department (HOSPITAL_BASED_OUTPATIENT_CLINIC_OR_DEPARTMENT_OTHER): Payer: BC Managed Care – PPO

## 2022-12-03 DIAGNOSIS — Z7982 Long term (current) use of aspirin: Secondary | ICD-10-CM | POA: Diagnosis not present

## 2022-12-03 DIAGNOSIS — D72829 Elevated white blood cell count, unspecified: Secondary | ICD-10-CM | POA: Diagnosis not present

## 2022-12-03 DIAGNOSIS — N3000 Acute cystitis without hematuria: Secondary | ICD-10-CM | POA: Diagnosis not present

## 2022-12-03 DIAGNOSIS — Z7902 Long term (current) use of antithrombotics/antiplatelets: Secondary | ICD-10-CM | POA: Insufficient documentation

## 2022-12-03 DIAGNOSIS — R1031 Right lower quadrant pain: Secondary | ICD-10-CM | POA: Diagnosis present

## 2022-12-03 LAB — COMPREHENSIVE METABOLIC PANEL
ALT: 16 U/L (ref 0–44)
AST: 12 U/L — ABNORMAL LOW (ref 15–41)
Albumin: 4.7 g/dL (ref 3.5–5.0)
Alkaline Phosphatase: 68 U/L (ref 38–126)
Anion gap: 10 (ref 5–15)
BUN: 14 mg/dL (ref 6–20)
CO2: 23 mmol/L (ref 22–32)
Calcium: 9.8 mg/dL (ref 8.9–10.3)
Chloride: 102 mmol/L (ref 98–111)
Creatinine, Ser: 0.81 mg/dL (ref 0.44–1.00)
GFR, Estimated: >60 mL/min (ref >60)
Glucose, Bld: 111 mg/dL — ABNORMAL HIGH (ref 70–99)
Potassium: 3.7 mmol/L (ref 3.5–5.1)
Sodium: 135 mmol/L (ref 135–145)
Total Bilirubin: 0.9 mg/dL (ref 0.3–1.2)
Total Protein: 7.8 g/dL (ref 6.5–8.1)

## 2022-12-03 LAB — CBC WITH DIFFERENTIAL/PLATELET
Abs Immature Granulocytes: 0 10*3/uL (ref 0.00–0.07)
Basophils Absolute: 0 10*3/uL (ref 0.0–0.1)
Basophils Relative: 0 %
Eosinophils Absolute: 0 10*3/uL (ref 0.0–0.5)
Eosinophils Relative: 0 %
HCT: 38.2 % (ref 36.0–46.0)
Hemoglobin: 13.3 g/dL (ref 12.0–15.0)
Lymphocytes Relative: 10 %
Lymphs Abs: 2.2 10*3/uL (ref 0.7–4.0)
MCH: 31.1 pg (ref 26.0–34.0)
MCHC: 34.8 g/dL (ref 30.0–36.0)
MCV: 89.3 fL (ref 80.0–100.0)
Monocytes Absolute: 1.1 10*3/uL — ABNORMAL HIGH (ref 0.1–1.0)
Monocytes Relative: 5 %
Neutro Abs: 18.6 10*3/uL — ABNORMAL HIGH (ref 1.7–7.7)
Neutrophils Relative %: 85 %
Platelets: 207 10*3/uL (ref 150–400)
RBC: 4.28 MIL/uL (ref 3.87–5.11)
RDW: 12.8 % (ref 11.5–15.5)
WBC Morphology: ABNORMAL
WBC: 21.9 10*3/uL — ABNORMAL HIGH (ref 4.0–10.5)
nRBC: 0 % (ref 0.0–0.2)

## 2022-12-03 LAB — URINALYSIS, ROUTINE W REFLEX MICROSCOPIC
Bacteria, UA: NONE SEEN
Bilirubin Urine: NEGATIVE
Glucose, UA: NEGATIVE mg/dL
Ketones, ur: NEGATIVE mg/dL
Nitrite: POSITIVE — AB
Protein, ur: 30 mg/dL — AB
Specific Gravity, Urine: 1.018 (ref 1.005–1.030)
WBC, UA: >50 WBC/hpf (ref 0–5)
pH: 5.5 (ref 5.0–8.0)

## 2022-12-03 LAB — PREGNANCY, URINE: Preg Test, Ur: NEGATIVE

## 2022-12-03 LAB — LIPASE, BLOOD: Lipase: 16 U/L (ref 11–51)

## 2022-12-03 MED ORDER — CIPROFLOXACIN HCL 500 MG PO TABS: 500.0000 mg | ORAL_TABLET | Freq: Two times a day (BID) | ORAL | 0 refills | Status: DC

## 2022-12-03 MED ORDER — CIPROFLOXACIN HCL 500 MG PO TABS
500.0000 mg | ORAL_TABLET | Freq: Once | ORAL | Status: AC
  Administered 2022-12-03: 500 mg via ORAL
  Filled 2022-12-03: qty 1

## 2022-12-03 MED ORDER — IOHEXOL 350 MG/ML SOLN
100.0000 mL | Freq: Once | INTRAVENOUS | Status: AC | PRN
  Administered 2022-12-03: 100 mL via INTRAVENOUS

## 2022-12-03 NOTE — ED Triage Notes (Signed)
Pt arrived caox4, ambulatory, NAD. Pt c/o R flank/RLQ abd pain that has been ongoing since Wednesday. Pt also c/o increase urinary frequency and states she had an episode of incontinence which is abnormal for her. Hx renal angiography.

## 2022-12-03 NOTE — ED Provider Notes (Signed)
[facility] Provider Note   CSN: [id number] Arrival date & time: 12/03/22  2042     History  Chief Complaint  Patient presents with   Flank Pain    Amanda Strong is a 42 y.o. female.  Is here with right lower abdominal pain/flank pain.  Some urinary frequency.  She states she has a history of renal artery stenosis and had angioplasty 6 months ago.  Has not followed up with her cardiologist/Dr. Alvester Chou since.  Blood pressure has been up and down.  She denies any fevers or chills.  No history of abdominal surgeries otherwise.  The history is provided by the patient.       Home Medications Prior to Admission medications   Medication Sig Start Date End Date Taking? Authorizing Provider  ciprofloxacin (CIPRO) 500 MG tablet Take 1 tablet (500 mg total) by mouth every 12 (twelve) hours. 12/03/22  Yes Tiara Bartoli, DO  ALPRAZolam (XANAX) 0.5 MG tablet TAKE 1 TABLET BY MOUTH EVERY DAY AS NEEDED FOR ANXIETY 11/28/22   Arfeen, Arlyce Harman, MD  aspirin EC 81 MG tablet Take 1 tablet (81 mg total) by mouth daily. Swallow whole. 06/14/22   Cheryln Manly, NP  atorvastatin (LIPITOR) 40 MG tablet Take 1 tablet (40 mg total) by mouth daily. 09/22/22   Lorretta Harp, MD  clopidogrel (PLAVIX) 75 MG tablet Take 1 tablet (75 mg total) by mouth daily with breakfast. 06/14/22   Reino Bellis B, NP  FLUoxetine (PROZAC) 20 MG capsule Take 1 capsule (20 mg total) by mouth daily. 11/28/22   Arfeen, Arlyce Harman, MD  levonorgestrel (MIRENA, 52 MG,) 20 MCG/DAY IUD 1 each by Intrauterine route once.    [provider]  valsartan (DIOVAN) 320 MG tablet Take 1 tablet (320 mg total) by mouth daily. 03/04/22   Susy Frizzle, MD  desvenlafaxine (PRISTIQ) 50 MG 24 hr tablet Take 1 tablet (50 mg total) by mouth daily. 01/02/12 04/18/12  Kennyth Arnold, FNP      Allergies    Cephalexin, Lamictal [lamotrigine], and Oxycodone    Review of Systems   Review of  Systems  Physical Exam Updated Vital Signs BP (!) 165/116   Pulse 100   Temp 99.1 F (37.3 C) (Oral)   Resp 13   Ht 5\' 7"  (1.702 m)   Wt 80.7 kg   SpO2 98%   BMI 27.88 kg/m  Physical Exam Vitals and nursing note reviewed.  Constitutional:      General: [name] not in acute distress.    Appearance: [name] well-developed. [name] not ill-appearing.  HENT:     Head: Normocephalic and atraumatic.  Eyes:     Conjunctiva/sclera: Conjunctivae normal.     Pupils: Pupils are equal, round, and reactive to light.  Cardiovascular:     Rate and Rhythm: Normal rate and regular rhythm.     Pulses: Normal pulses.     Heart sounds: Normal heart sounds. No murmur heard. Pulmonary:     Effort: Pulmonary effort is normal. No respiratory distress.     Breath sounds: Normal breath sounds.  Abdominal:     Palpations: Abdomen is soft.     Tenderness: There is abdominal tenderness.  Musculoskeletal:        General: No swelling.     Cervical back: Normal range of motion and neck supple.  Skin:    General: Skin is warm and dry.     Capillary Refill: Capillary  refill takes less than 2 seconds.  Neurological:     General: No focal deficit present.     Mental Status: [name] alert.  Psychiatric:        Mood and Affect: Mood normal.     ED Results / Procedures / Treatments   Labs (all labs ordered are listed, but only abnormal results are displayed) Labs Reviewed  CBC WITH DIFFERENTIAL/PLATELET - Abnormal; Notable for the following components:      Result Value   WBC 21.9 (*)    Neutro Abs 18.6 (*)    Monocytes Absolute 1.1 (*)    All other components within normal limits  COMPREHENSIVE METABOLIC PANEL - Abnormal; Notable for the following components:   Glucose, Bld 111 (*)    AST 12 (*)    All other components within normal limits  URINALYSIS, ROUTINE W REFLEX MICROSCOPIC - Abnormal; Notable for the following components:   APPearance HAZY (*)    Hgb urine dipstick MODERATE (*)     Protein, ur 30 (*)    Nitrite POSITIVE (*)    Leukocytes,Ua LARGE (*)    All other components within normal limits  URINE CULTURE  LIPASE, BLOOD  PREGNANCY, URINE    EKG None  Radiology CT Angio Abd/Pel w/ and/or w/o  Result Date: 12/03/2022 CLINICAL DATA:  Right flank and right lower quadrant pain. EXAM: CTA ABDOMEN AND PELVIS WITHOUT AND WITH CONTRAST TECHNIQUE: Multidetector CT imaging of the abdomen and pelvis was performed using the standard protocol during bolus administration of intravenous contrast. Multiplanar reconstructed images and MIPs were obtained and reviewed to evaluate the vascular anatomy. RADIATION DOSE REDUCTION: This exam was performed according to the departmental dose-optimization program which includes automated exposure control, adjustment of the mA and/or kV according to patient size and/or use of iterative reconstruction technique. CONTRAST:  115mL OMNIPAQUE IOHEXOL 350 MG/ML SOLN COMPARISON:  05/27/2022 FINDINGS: VASCULAR Aorta: Normal caliber aorta without aneurysm, dissection, vasculitis or significant stenosis. Celiac: Patent without evidence of aneurysm, dissection, vasculitis or significant stenosis. SMA: Patent without evidence of aneurysm, dissection, vasculitis or significant stenosis. Renals: Both renal arteries are patent without evidence of aneurysm, dissection, vasculitis, fibromuscular dysplasia or significant stenosis. IMA: Patent without evidence of aneurysm, dissection, vasculitis or significant stenosis. Inflow: Patent without evidence of aneurysm, dissection, vasculitis or significant stenosis. Proximal Outflow: Bilateral common femoral and visualized portions of the superficial and profunda femoral arteries are patent without evidence of aneurysm, dissection, vasculitis or significant stenosis. Veins: No obvious venous abnormality within the limitations of this arterial phase study. Review of the MIP images confirms the above findings. NON-VASCULAR Lower  chest: No acute findings Hepatobiliary: No focal hepatic abnormality. Gallbladder unremarkable. Pancreas: No focal abnormality or ductal dilatation. Spleen: No focal abnormality.  Normal size. Adrenals/Urinary Tract: No adrenal abnormality. No focal renal abnormality. No stones or hydronephrosis. Urinary bladder is unremarkable. Stomach/Bowel: Stomach, large and small bowel grossly unremarkable. Lymphatic: No adenopathy Reproductive: Uterus and adnexa unremarkable.  No mass. Other: No free fluid or free air. Musculoskeletal: No acute bony abnormality. IMPRESSION: VASCULAR No aortic abnormality or evidence of branch vessel abnormality. No evidence of renal artery stenosis. NON-VASCULAR No acute findings in the abdomen or pelvis. Electronically Signed   By: Rolm Baptise M.D.   On: 12/03/2022 22:19    Procedures Procedures    Medications Ordered in ED Medications  ciprofloxacin (CIPRO) tablet 500 mg (has no administration in time range)  iohexol (OMNIPAQUE) 350 MG/ML injection 100 mL (100 mLs Intravenous Contrast Given  12/03/22 2152)    ED Course/ Medical Decision Making/ A&P                             Medical Decision Making Amount and/or Complexity of Data Reviewed Labs: ordered. Radiology: ordered.  Risk Prescription drug management.   Agness B Rugar is here with right-sided abdominal pain.  Unremarkable vitals.  No fever.  History of renal artery stenosis status postangioplasty.  History of high blood pressure.  Patient is on Lipitor and Plavix.  Differential diagnosis is kidney stone versus appendicitis versus UTI versus may be vascular process.  Will get CBC, CMP, lipase, urinalysis, CT scan abdomen pelvis.  Lab work unremarkable.  Mild leukocytosis of 20.  Urinalysis consistent with infection.  No kidney injury.  CT scan per radiology report with no acute findings.  No evidence of renal artery stenosis.  No kidney stone.  Will treat for urinary tract infection.  Will have her  follow-up with primary care doctor.  Discharged in good condition.  This chart was dictated using voice recognition software.  Despite best efforts to proofread,  errors can occur which can change the documentation meaning.         Final Clinical Impression(s) / ED Diagnoses Final diagnoses:  Acute cystitis without hematuria    Rx / DC Orders ED Discharge Orders          Ordered    ciprofloxacin (CIPRO) 500 MG tablet  Every 12 hours        12/03/22 2232              Lennice Sites, DO 12/03/22 2234

## 2022-12-05 ENCOUNTER — Telehealth: Payer: Self-pay

## 2022-12-05 NOTE — Transitions of Care (Post Inpatient/ED Visit) (Signed)
   12/05/2022  Name: Amanda Strong MRN: [id number] DOB: 05/02/1981  Today's TOC FU Call Status: Today's TOC FU Call Status:: Successful TOC FU Call Competed TOC FU Call Complete Date: 12/05/22  Transition Care Management Follow-up Telephone Call Date of Discharge: 12/03/22 Discharge Facility: [facility] Type of Discharge: Emergency Department Reason for ED Visit: Other: (Acute cystitis without hematuria) How have you been since you were released from the hospital?: Better Any questions or concerns?: No  Items Reviewed: Did you receive and understand the discharge instructions provided?: Yes Medications obtained and verified?: Yes (Medications Reviewed) Any new allergies since your discharge?: No Dietary orders reviewed?: Yes Do you have support at home?: Yes  Home Care and Equipment/Supplies: [place] Ordered?: No Any new equipment or medical supplies ordered?: No  Functional Questionnaire: Do you need assistance with bathing/showering or dressing?: No Do you need assistance with meal preparation?: No Do you need assistance with eating?: No Do you have difficulty maintaining continence: No Do you need assistance with getting out of bed/getting out of a chair/moving?: No Do you have difficulty managing or taking your medications?: No  Follow up appointments reviewed: PCP Follow-up appointment confirmed?: Yes Date of PCP follow-up appointment?: 12/15/22 Follow-up Provider: [facility] Follow-up appointment confirmed?: NA Do you need transportation to your follow-up appointment?: No Do you understand care options if your condition(s) worsen?: Yes-patient verbalized understanding    Fidelity LPN [place] Direct Dial [phone]

## 2022-12-06 ENCOUNTER — Other Ambulatory Visit: Payer: Self-pay | Admitting: Family Medicine

## 2022-12-06 LAB — URINE CULTURE: Culture: >=100000 — AB

## 2022-12-06 MED ORDER — NIFEDIPINE ER OSMOTIC RELEASE 60 MG PO TB24: 60.0000 mg | ORAL_TABLET | Freq: Every day | ORAL | 5 refills | Status: DC

## 2022-12-07 ENCOUNTER — Telehealth (HOSPITAL_BASED_OUTPATIENT_CLINIC_OR_DEPARTMENT_OTHER): Payer: Self-pay | Admitting: *Deleted

## 2022-12-07 NOTE — Telephone Encounter (Signed)
Post ED Visit - Positive Culture Follow-up  Culture report reviewed by antimicrobial stewardship pharmacist: [place] Team []  Elenor Quinones, Pharm.D. []  Heide Guile, Pharm.D., BCPS AQ-ID []  Parks Neptune, Pharm.D., BCPS []  Alycia Rossetti, Pharm.D., BCPS []  [place], Pharm.D., BCPS, AAHIVP []  Legrand Como, Pharm.D., BCPS, AAHIVP []  Salome Arnt, PharmD, BCPS []  Johnnette Gourd, PharmD, BCPS []  Hughes Better, PharmD, BCPS []  Leeroy Cha, PharmD []  Laqueta Linden, PharmD, BCPS []  Albertina Parr, PharmD  [place] Team []  Leodis Sias, PharmD []  Lindell Spar, PharmD []  Royetta Asal, PharmD []  Graylin Shiver, Rph []  Rema Fendt) Glennon Mac, PharmD []  Arlyn Dunning, PharmD []  Netta Cedars, PharmD []  Dia Sitter, PharmD []  Leone Haven, PharmD []  Gretta Arab, PharmD []  Theodis Shove, PharmD []  Peggyann Juba, PharmD []  Reuel Boom, PharmD   Positive urine culture Treated with Ciprofloxacin HCL, organism sensitive to the same and no further patient follow-up is required at this time.  Harlon Flor [facility] 12/07/2022, 8:31 AM

## 2022-12-08 ENCOUNTER — Other Ambulatory Visit: Payer: Self-pay | Admitting: Family Medicine

## 2022-12-08 MED ORDER — FLUCONAZOLE 150 MG PO TABS: 150.0000 mg | ORAL_TABLET | Freq: Once | ORAL | 0 refills | Status: AC

## 2022-12-09 ENCOUNTER — Ambulatory Visit (HOSPITAL_COMMUNITY)
Admission: RE | Admit: 2022-12-09 | Discharge: 2022-12-09 | Disposition: A | Discharge status: Home / Self Care | Payer: BC Managed Care – PPO | Source: Ambulatory Visit | Attending: Cardiovascular Disease | Admitting: Cardiovascular Disease

## 2022-12-09 DIAGNOSIS — I10 Essential (primary) hypertension: Secondary | ICD-10-CM | POA: Diagnosis not present

## 2022-12-09 DIAGNOSIS — I150 Renovascular hypertension: Secondary | ICD-10-CM | POA: Insufficient documentation

## 2022-12-15 ENCOUNTER — Ambulatory Visit (INDEPENDENT_AMBULATORY_CARE_PROVIDER_SITE_OTHER): Payer: BC Managed Care – PPO | Admitting: Family Medicine

## 2022-12-15 VITALS — BP 132/78 | HR 87 | Temp 98.5°F | Ht 67.0 in | Wt 180.4 lb

## 2022-12-15 DIAGNOSIS — N3001 Acute cystitis with hematuria: Secondary | ICD-10-CM | POA: Diagnosis not present

## 2022-12-15 DIAGNOSIS — R109 Unspecified abdominal pain: Secondary | ICD-10-CM | POA: Diagnosis not present

## 2022-12-15 LAB — URINALYSIS, ROUTINE W REFLEX MICROSCOPIC
Bacteria, UA: NONE SEEN /HPF
Bilirubin Urine: NEGATIVE
Glucose, UA: NEGATIVE
Hyaline Cast: NONE SEEN /LPF
Ketones, ur: NEGATIVE
Leukocytes,Ua: NEGATIVE
Nitrite: NEGATIVE
Protein, ur: NEGATIVE
Specific Gravity, Urine: 1.025 (ref 1.001–1.035)
WBC, UA: NONE SEEN /HPF (ref 0–5)
pH: 5.5 (ref 5.0–8.0)

## 2022-12-15 LAB — MICROSCOPIC MESSAGE

## 2022-12-15 NOTE — Progress Notes (Signed)
.  tran

## 2022-12-15 NOTE — Progress Notes (Signed)
Subjective:    Patient ID: Amanda Strong, female    DOB: 06-20-1981, 42 y.o.   MRN: [id number]  Patient is a very pleasant 42 year old African-American female who presents today for follow-up from recent ER visit.  Patient had sudden onset of abdominal pain, flank pain, fever, and weakness.  She went to the emergency room.  She was found to have profound leukocytosis with a white count greater than 21.  Urinalysis showed significant blood and leukocyte esterase.  Microscopy showed pyuria but no bacteria.  Urine culture however did grow E. coli.  She was given antibiotics.  She states that after finishing the antibiotics she feels much better.  Repeat urinalysis today shows +2 blood but no nitrates and no leukocyte esterase.  She denies any abdominal pain.  She states that she is feeling back to her normal self.   We recently changed her blood pressure medicine.  The patient is considering starting to have a family.  Therefore we wanted to discontinue the ARB because of potential birth defects.  I switched her to nifedipine 60 mg a day.  Her blood pressure today is fine however she states that has been getting higher at work.  [name] only been on the medication for 1 week. Past Medical History:  Diagnosis Date   Abnormal Pap smear    Acne    Allergy    Anxiety    Depression    Group B streptococcal infection in pregnancy 10/28/2012   History of chicken pox    Hypertension    Hyperthyroidism    LGSIL (low grade squamous intraepithelial dysplasia)    Dr. Marvel Plan   Normal pregnancy, repeat 10/28/2012   Past Surgical History:  Procedure Laterality Date   BUNIONECTOMY     FOOT SURGERY     PERIPHERAL VASCULAR BALLOON ANGIOPLASTY  06/13/2022   Procedure: PERIPHERAL VASCULAR BALLOON ANGIOPLASTY;  Surgeon: Lorretta Harp, MD;  Location: [facility];  Service: Cardiovascular;;   RENAL ANGIOGRAPHY N/A 06/13/2022   Procedure: RENAL ANGIOGRAPHY;  Surgeon: Lorretta Harp, MD;  Location:  [facility];  Service: Cardiovascular;  Laterality: N/A;   WISDOM TOOTH EXTRACTION     Current Outpatient Medications on File Prior to Visit  Medication Sig Dispense Refill   ALPRAZolam (XANAX) 0.5 MG tablet TAKE 1 TABLET BY MOUTH EVERY DAY AS NEEDED FOR ANXIETY 20 tablet 0   aspirin EC 81 MG tablet Take 1 tablet (81 mg total) by mouth daily. Swallow whole. 90 tablet 1   atorvastatin (LIPITOR) 40 MG tablet Take 1 tablet (40 mg total) by mouth daily. 90 tablet 0   ciprofloxacin (CIPRO) 500 MG tablet Take 1 tablet (500 mg total) by mouth every 12 (twelve) hours. 10 tablet 0   clopidogrel (PLAVIX) 75 MG tablet Take 1 tablet (75 mg total) by mouth daily with breakfast. 90 tablet 1   FLUoxetine (PROZAC) 20 MG capsule Take 1 capsule (20 mg total) by mouth daily. 90 capsule 0   levonorgestrel (MIRENA, 52 MG,) 20 MCG/DAY IUD 1 each by Intrauterine route once.     NIFEdipine (PROCARDIA XL/NIFEDICAL XL) 60 MG 24 hr tablet Take 1 tablet (60 mg total) by mouth daily. Discontinue valsartan 30 tablet 5   [DISCONTINUED] desvenlafaxine (PRISTIQ) 50 MG 24 hr tablet Take 1 tablet (50 mg total) by mouth daily. 21 tablet 0   No current facility-administered medications on file prior to visit.   Allergies  Allergen Reactions   Cephalexin  REACTION: intense itching all over   Lamictal [Lamotrigine] Hives and Itching   Oxycodone Itching   Social History   Socioeconomic History   Marital status: Single    Spouse name: Not on file   Number of children: 1   Years of education: 16   Highest education level: Not on file  Occupational History   Occupation: [employer]    Employer: [employer]  Tobacco Use   Smoking status: Former    Types: Cigarettes    Quit date: 10/22/2008    Years since quitting: 14.1   Smokeless tobacco: Never  Vaping Use   Vaping Use: Never used  Substance and Sexual Activity   Alcohol use: No    Alcohol/week: 0.0 standard drinks of alcohol     Comment: occasionally   Drug use: No   Sexual activity: Yes    Partners: Male    Birth control/protection: Condom, I.U.D.  Other Topics Concern   Not on file  Social History Narrative   Caffeine Use-yes   Regular exercise-no      Entered 03/2014:   Single Mom of 2 children   Children are:      Girl--13 months old      Boy--42 years old   She works at [employer].    Currently works with wound care   Social Determinants of Health   Financial Resource Strain: Not on file  Food Insecurity: Not on file  Transportation Needs: Not on file  Physical Activity: Not on file  Stress: Not on file  Social Connections: Not on file  Intimate Partner Violence: Not on file      Review of Systems  All other systems reviewed and are negative.      Objective:   Physical Exam Vitals reviewed.  Constitutional:      General: [name] not in acute distress.    Appearance: Normal appearance. [name] not ill-appearing or toxic-appearing.  Cardiovascular:     Rate and Rhythm: Normal rate and regular rhythm.     Pulses: Normal pulses.     Heart sounds: Normal heart sounds. No murmur heard.    No friction rub. No gallop.  Pulmonary:     Effort: Pulmonary effort is normal. No respiratory distress.     Breath sounds: No stridor. No wheezing, rhonchi or rales.  Musculoskeletal:     Right lower leg: No edema.     Left lower leg: No edema.  Neurological:     Mental Status: [name] alert.          Assessment & Plan:  Acute cystitis with hematuria - Plan: Urinalysis, Routine w reflex microscopic  Flank pain - Plan: Urinalysis, Routine w reflex microscopic, CBC with Differential/Platelet, BASIC METABOLIC PANEL WITH GFR, Urine Culture Send urine culture however clinically appears to be resolved.  Patient already had a CT scan of the abdomen and pelvis that showed no renal lesions.  If hematuria persist we may consider a cystoscopy but patient is low risk for any bladder cancer.  Repeat  urinalysis in the future.  Repeat CBC to monitor leukocytosis to ensure resolution.  Patient will call me in 1 week with her blood pressures.  If persistently elevated, may consider adding labetalol

## 2022-12-16 LAB — CBC WITH DIFFERENTIAL/PLATELET
Absolute Monocytes: 814 cells/uL (ref 200–950)
Basophils Absolute: 35 cells/uL (ref 0–200)
Basophils Relative: 0.3 %
Eosinophils Absolute: 130 cells/uL (ref 15–500)
Eosinophils Relative: 1.1 %
HCT: 39.1 % (ref 35.0–45.0)
Hemoglobin: 13.1 g/dL (ref 11.7–15.5)
Lymphs Abs: 3044 cells/uL (ref 850–3900)
MCH: 30.1 pg (ref 27.0–33.0)
MCHC: 33.5 g/dL (ref 32.0–36.0)
MCV: 89.9 fL (ref 80.0–100.0)
MPV: 11.5 fL (ref 7.5–12.5)
Monocytes Relative: 6.9 %
Neutro Abs: 7776 cells/uL (ref 1500–7800)
Neutrophils Relative %: 65.9 %
Platelets: 338 10*3/uL (ref 140–400)
RBC: 4.35 10*6/uL (ref 3.80–5.10)
RDW: 12.2 % (ref 11.0–15.0)
Total Lymphocyte: 25.8 %
WBC: 11.8 10*3/uL — ABNORMAL HIGH (ref 3.8–10.8)

## 2022-12-16 LAB — BASIC METABOLIC PANEL WITH GFR
BUN: 16 mg/dL (ref 7–25)
CO2: 27 mmol/L (ref 20–32)
Calcium: 9.6 mg/dL (ref 8.6–10.2)
Chloride: 106 mmol/L (ref 98–110)
Creat: 0.79 mg/dL (ref 0.50–0.99)
Glucose, Bld: 113 mg/dL — ABNORMAL HIGH (ref 65–99)
Potassium: 4.5 mmol/L (ref 3.5–5.3)
Sodium: 141 mmol/L (ref 135–146)
eGFR: 96 mL/min/{1.73_m2} (ref >=60)

## 2022-12-16 LAB — URINE CULTURE
MICRO NUMBER:: 14754802
Result:: NO GROWTH
SPECIMEN QUALITY:: ADEQUATE

## 2022-12-22 NOTE — Progress Notes (Signed)
Cardiology Clinic Note   Date: 12/23/2022 ID: DALEIGH DIKEMAN, DOB 01-07-1981, MRN [id number]  Primary Cardiologist:  Nanetta Batty, MD  Patient Profile    Amanda Strong is a 42 y.o. female who presents to the clinic today for follow-up.  Past medical history significant for: Renovascular hypertension. Renal angiography 06/13/2022: Successful right renal artery FMD PTA. Renal ultrasound 12/09/2022: Right: Normal-sized kidney, normal cortical thickness, no evidence of right renal artery stenosis.  Left: Normal-sized kidney, normal cortical thickness, no evidence of left renal artery stenosis. Hyperlipidemia. Lipid panel August 2023: LDL 83, HDL 51, TG 79, total 151.   History of Present Illness    Amanda Strong was first evaluated by Dr. Allyson Sabal on 05/18/2022 for renovascular hypertension at the request of Dr. Tanya Nones.  Patient with history of hypertension since birth of her last child 14 years prior.  She had been resistant on 2 medications so PCP performed renal ultrasound which was suggestive of possible fibromuscular dysplasia on the right.  CTA abdomen pelvis was suggestive of focal FMD of the right renal artery.  Patient underwent PTA of right renal artery FMD.  Patient was last seen in the office by Dr. Allyson Sabal on 06/28/2022.  She was doing well at that time with improved blood pressures at home.  Carotid ultrasound was performed to rule out carotid FMD, which was normal.  Patient was last seen in the ER on 12/03/2022 with complaints of flank pain.  She underwent workup and was treated for UTI.  Today, patient is doing well. She was recently changed to nifedipine by Dr. Tanya Nones secondary to patient considering getting pregnant. Patient denies shortness of breath or dyspnea on exertion. No chest pain, pressure, or tightness. Denies lower extremity edema, orthopnea, or PND. No palpitations. BP at home typically 130-140/70-80.  She reports occasional headaches that do not always respond  to Tylenol.  She has not checked her blood pressure when having a headache to see if it correlates with elevated BP. She would like to discuss coming off aspirin, Plavix, and Lipitor. As she understood it, she was only supposed to be on these medications for six months after her kidney procedure. 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 She stays active learning how to ride horses.     ROS: All other systems reviewed and are otherwise negative except as noted in History of Present Illness.  Studies Reviewed    ECG is not ordered today.       Physical Exam    VS:  BP 136/72   Pulse 70   Ht 5\' 7"  (1.702 m)   Wt 175 lb 6.4 oz (79.6 kg)   SpO2 99%   BMI 27.47 kg/m  , BMI Body mass index is 27.47 kg/m.  GEN: Well nourished, well developed, in no acute distress. Neck: No JVD or carotid bruits. Cardiac:  RRR. No murmurs. No rubs or gallops.   Respiratory:  Respirations regular and unlabored. Clear to auscultation without rales, wheezing or rhonchi. GI: Soft, nontender, nondistended. Extremities: Radials/DP/PT 2+ and equal bilaterally. No clubbing or cyanosis. No edema.  Skin: Warm and dry, no rash. Neuro: Strength intact.  Assessment & Plan   Renovascular hypertension.  S/p PTA of right renal artery FMD.  Renal ultrasound March 2024 was normal.  BP today 136/72. Home BP typically 130-140/70-80. Patient denies vision changes or dizziness. She was recently changed from valsartan to nifedipine by Dr. Tanya Nones and is working closely with him for  titration. [name] instructed to continue monitoring BP and check it if she has a headache. She would like to stop Plavix, aspirin, and Lipitor. As she understood it she was only meant to be on these medicines for six months after her kidney intervention. [name] hoping to get pregnant. I will reach out to Dr. Allyson Sabal for his guidance. For now continue all medications.  Hyperlipidemia. LDL August 2023 83. Will get repeat lipid panel today.    Disposition: Lipid panel today. Monitor BP with readings once a day and when you have a headache. Continue to work closely with Dr. Tanya Nones for medication titration. Repeat renal US in March 2025 and return in 1 year.         Signed, Etta Grandchild. Amanda Rahl, DNP, NP-C

## 2022-12-23 ENCOUNTER — Ambulatory Visit: Payer: BC Managed Care – PPO | Attending: Cardiovascular Disease | Admitting: Student

## 2022-12-23 ENCOUNTER — Encounter: Payer: Self-pay | Admitting: Student

## 2022-12-23 VITALS — BP 136/72 | HR 70 | Ht 67.0 in | Wt 175.4 lb

## 2022-12-23 DIAGNOSIS — I150 Renovascular hypertension: Secondary | ICD-10-CM

## 2022-12-23 DIAGNOSIS — E785 Hyperlipidemia, unspecified: Secondary | ICD-10-CM | POA: Diagnosis not present

## 2022-12-23 NOTE — Patient Instructions (Addendum)
Medication Instructions:  Your physician recommends that you continue on your current medications as directed. Please refer to the Current Medication list given to you today.  *If you need a refill on your cardiac medications before your next appointment, please call your pharmacy*   Lab Work: Your physician recommends that you have the following lab drawn today: Lipid panel  If you have labs (blood work) drawn today and your tests are completely normal, you will receive your results only by: MyChart Message (if you have MyChart) OR A paper copy in the mail If you have any lab test that is abnormal or we need to change your treatment, we will call you to review the results.   Testing/Procedures: Your physician has requested that you have a renal artery duplex In March of 2025. During this test, an ultrasound is used to evaluate blood flow to the kidneys. Allow one hour for this exam. Do not eat after midnight the day before and avoid carbonated beverages. Take your medications as you usually do.    Follow-Up: At [facility], you and your health needs are our priority.  As part of our continuing mission to provide you with exceptional heart care, we have created designated Provider Care Teams.  These Care Teams include your primary Cardiologist (physician) and Advanced Practice Providers (APPs -  Physician Assistants and Nurse Practitioners) who all work together to provide you with the care you need, when you need it.  We recommend signing up for the patient portal called "MyChart".  Sign up information is provided on this After Visit Summary.  MyChart is used to connect with patients for Virtual Visits (Telemedicine).  Patients are able to view lab/test results, encounter notes, upcoming appointments, etc.  Non-urgent messages can be sent to your provider as well.   To learn more about what you can do with MyChart, go to [email or web address].    Your next appointment:   1  year(s)  Provider:   Nanetta Batty, MD

## 2022-12-24 LAB — LIPID PANEL
Chol/HDL Ratio: 3.2 ratio (ref 0.0–4.4)
Cholesterol, Total: 159 mg/dL (ref 100–199)
HDL: 50 mg/dL (ref >39)
LDL Chol Calc (NIH): 96 mg/dL (ref 0–99)
Triglycerides: 65 mg/dL (ref 0–149)
VLDL Cholesterol Cal: 13 mg/dL (ref 5–40)

## 2023-02-27 ENCOUNTER — Encounter (HOSPITAL_COMMUNITY): Payer: Self-pay | Admitting: Psychiatry

## 2023-02-27 ENCOUNTER — Other Ambulatory Visit (HOSPITAL_COMMUNITY): Payer: Self-pay | Admitting: Psychiatry

## 2023-02-27 ENCOUNTER — Telehealth (HOSPITAL_BASED_OUTPATIENT_CLINIC_OR_DEPARTMENT_OTHER): Payer: BC Managed Care – PPO | Admitting: Psychiatry

## 2023-02-27 VITALS — Wt 180.0 lb

## 2023-02-27 DIAGNOSIS — F319 Bipolar disorder, unspecified: Secondary | ICD-10-CM

## 2023-02-27 DIAGNOSIS — F419 Anxiety disorder, unspecified: Secondary | ICD-10-CM

## 2023-02-27 MED ORDER — FLUOXETINE HCL 20 MG PO CAPS: 20.0000 mg | ORAL_CAPSULE | Freq: Every day | ORAL | 0 refills | Status: DC

## 2023-02-27 MED ORDER — ALPRAZOLAM 0.5 MG PO TABS: ORAL_TABLET | ORAL | 0 refills | Status: DC
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
## 2023-04-18 ENCOUNTER — Telehealth (HOSPITAL_COMMUNITY): Payer: Self-pay | Admitting: *Deleted

## 2023-04-18 DIAGNOSIS — F419 Anxiety disorder, unspecified: Secondary | ICD-10-CM

## 2023-04-18 MED ORDER — ALPRAZOLAM 0.5 MG PO TABS: ORAL_TABLET | ORAL | 0 refills | Status: DC

## 2023-04-18 NOTE — Telephone Encounter (Signed)
Pt called requesting a refill of the Xanax 0.5 mg tabs. Last script and last visit on 03/09/23. Prescription was for #20. Pt has an upcoming appointment on 05/29/23. Please review.

## 2023-04-18 NOTE — Telephone Encounter (Signed)
Done

## 2023-04-30 ENCOUNTER — Emergency Department (HOSPITAL_BASED_OUTPATIENT_CLINIC_OR_DEPARTMENT_OTHER)
Admission: EM | Admit: 2023-04-30 | Discharge: 2023-04-30 | Disposition: A | Discharge status: Home / Self Care | Payer: BC Managed Care – PPO | Attending: Emergency Medicine | Admitting: Emergency Medicine

## 2023-04-30 ENCOUNTER — Encounter (HOSPITAL_BASED_OUTPATIENT_CLINIC_OR_DEPARTMENT_OTHER): Payer: Self-pay | Admitting: Emergency Medicine

## 2023-04-30 DIAGNOSIS — L03116 Cellulitis of left lower limb: Secondary | ICD-10-CM

## 2023-04-30 DIAGNOSIS — Z7982 Long term (current) use of aspirin: Secondary | ICD-10-CM | POA: Diagnosis not present

## 2023-04-30 MED ORDER — DOXYCYCLINE HYCLATE 100 MG PO CAPS: 100.0000 mg | ORAL_CAPSULE | Freq: Two times a day (BID) | ORAL | 0 refills | Status: DC

## 2023-04-30 NOTE — Discharge Instructions (Signed)
Please read and follow all provided instructions.  Your diagnoses today include:  1. Cellulitis of left lower extremity    Tests performed today include: Vital signs. See below for your results today.   Medications prescribed:  Doxycycline - antibiotic  You have been prescribed an antibiotic medicine: take the entire course of medicine even if you are feeling better. Stopping early can cause the antibiotic not to work.  Take any prescribed medications only as directed.   Home care instructions:  Follow any educational materials contained in this packet. Keep affected area above the level of your heart when possible. Wash area gently twice a day with warm soapy water. Do not apply alcohol or hydrogen peroxide. Cover the area if it draining or weeping.   Follow-up instructions: Return to the Emergency Department in 48 hours for a recheck if your symptoms are not significantly improved.   Return instructions:  Return to the Emergency Department if you have: Fever Worsening symptoms Worsening pain Worsening swelling Redness of the skin that moves away from the affected area, especially if it streaks away from the affected area  Any other emergent concerns  Your vital signs today were: BP (!) 163/105 (BP Location: Right Arm)   Pulse 89   Temp 98 F (36.7 C) (Oral)   Resp 16   LMP 04/14/2023   SpO2 100%  If your blood pressure (BP) was elevated above 135/85 this visit, please have this repeated by your doctor within one month. --------------

## 2023-04-30 NOTE — ED Triage Notes (Signed)
Cellulitis/ redness on left thigh. Tender to touch Notice yesterday. Temp 99.2 yesterday. Unsure if she got bite,

## 2023-04-30 NOTE — ED Notes (Signed)
Patient verbalizes understanding of discharge instructions. Opportunity for questioning and answers were provided. Patient discharged from ED.  °

## 2023-04-30 NOTE — ED Provider Notes (Signed)
[place] EMERGENCY DEPARTMENT AT [facility] Provider Note   CSN: [id number] Arrival date & time: 04/30/23  1621     History  No chief complaint on file.   Amanda Strong is a 42 y.o. female.  Patient presents the emergency department for evaluation of possible area of cellulitis on the left anterior thigh.  Patient first noted a small red area that was sore like a bruise yesterday.  She denies injury.  She denies any insect bites or trauma to the area.  Today the area was slightly larger.  No streaking.  She has not had any fevers, nausea or vomiting.  She was concerned about cellulitis, as she has a wound care nurse.  There is not particularly itchy, just achy.       Home Medications Prior to Admission medications   Medication Sig Start Date End Date Taking? Authorizing Provider  doxycycline (VIBRAMYCIN) 100 MG capsule Take 1 capsule (100 mg total) by mouth 2 (two) times daily. 04/30/23  Yes Renne Crigler, PA-C  ALPRAZolam Prudy Feeler) 0.5 MG tablet TAKE 1 TABLET BY MOUTH EVERY DAY AS NEEDED FOR ANXIETY 04/18/23   Arfeen, Phillips Grout, MD  aspirin EC 81 MG tablet Take 1 tablet (81 mg total) by mouth daily. Swallow whole. 06/14/22   Arty Baumgartner, NP  FLUoxetine (PROZAC) 20 MG capsule Take 1 capsule (20 mg total) by mouth daily. 02/27/23   Arfeen, Phillips Grout, MD  NIFEdipine (PROCARDIA XL/NIFEDICAL XL) 60 MG 24 hr tablet Take 1 tablet (60 mg total) by mouth daily. Discontinue valsartan 12/06/22   Donita Brooks, MD  desvenlafaxine (PRISTIQ) 50 MG 24 hr tablet Take 1 tablet (50 mg total) by mouth daily. 01/02/12 04/18/12  Worthy Rancher B, FNP      Allergies    Cephalexin, Lamictal [lamotrigine], and Oxycodone    Review of Systems   Review of Systems  Physical Exam Updated Vital Signs BP (!) 163/105 (BP Location: Right Arm)   Pulse 89   Temp 98 F (36.7 C) (Oral)   Resp 16   LMP 04/14/2023   SpO2 100%  Physical Exam Vitals and nursing note reviewed.  Constitutional:       Appearance: [name] well-developed.  HENT:     Head: Normocephalic and atraumatic.  Eyes:     Conjunctiva/sclera: Conjunctivae normal.  Pulmonary:     Effort: No respiratory distress.  Musculoskeletal:     Cervical back: Normal range of motion and neck supple.  Skin:    General: Skin is warm and dry.     Comments: Patient with approximately 6 to 8 cm area diameter of mild induration and erythema, poorly demarcated to the left anterior thigh, concerning for cellulitis.  No fluctuance or abscess noted.  No lymphangitis.  Area is minimally tender.  No crepitus.  Neurological:     Mental Status: [name] alert.     ED Results / Procedures / Treatments   Labs (all labs ordered are listed, but only abnormal results are displayed) Labs Reviewed - No data to display  EKG None  Radiology No results found.  Procedures Procedures    Medications Ordered in ED Medications - No data to display  ED Course/ Medical Decision Making/ A&P    Patient seen and examined. History obtained directly from patient.   Labs/EKG: None ordered  Imaging: None ordered  Medications/Fluids: None ordered   Most recent vital signs reviewed and are as follows: BP (!) 163/105 (BP Location: Right Arm)  Pulse 89   Temp 98 F (36.7 C) (Oral)   Resp 16   LMP 04/14/2023   SpO2 100%   Initial impression: Localized cellulitis without complicating features, no abscess  Home treatment plan: Cool compress, OTC meds for discomfort, elevation when able.  Will give prescription for doxycycline, patient has a Keflex allergy.  Return instructions discussed with patient: Pt urged to return with worsening pain, worsening swelling, expanding area of redness or streaking up extremity, fever, or any other concerns.  Urged to take complete course of antibiotics as prescribed. Pt verbalizes understanding and agrees with plan.  Follow-up instructions discussed with patient: See doctor return to the emergency  department in 48 to 72 hours for recheck if not improving.  We discussed that if she develops any areas of fluctuance or abscess, she may need this drained in the future.                                Medical Decision Making Risk Prescription drug management.   Patient with area that appears to be mild cellulitis without complicating features.  Patient is immunocompetent, no fevers or abnormal vital signs.  She appears well, nontoxic.  Treatment plan as above.        Final Clinical Impression(s) / ED Diagnoses Final diagnoses:  Cellulitis of left lower extremity    Rx / DC Orders ED Discharge Orders          Ordered    doxycycline (VIBRAMYCIN) 100 MG capsule  2 times daily        04/30/23 1720              Renne Crigler, PA-C 04/30/23 1724    Horton, Clabe Seal, DO 04/30/23 1902

## 2023-05-01 ENCOUNTER — Telehealth: Payer: Self-pay

## 2023-05-01 NOTE — Transitions of Care (Post Inpatient/ED Visit) (Signed)
   05/01/2023  Name: Amanda Strong MRN: [id number] DOB: Feb 02, 1981  Today's TOC FU Call Status: Today's TOC FU Call Status:: Successful TOC FU Call Completed TOC FU Call Complete Date: 05/01/23  Transition Care Management Follow-up Telephone Call Date of Discharge: 04/30/23 Discharge Facility: [facility] Type of Discharge: Emergency Department Reason for ED Visit: Other: (Cellulitis of left lower extremity) How have you been since you were released from the hospital?: Better Any questions or concerns?: No  Items Reviewed: Did you receive and understand the discharge instructions provided?: Yes Medications obtained,verified, and reconciled?: Yes (Medications Reviewed) Any new allergies since your discharge?: No Dietary orders reviewed?: Yes Do you have support at home?: Yes  Medications Reviewed Today: Medications Reviewed Today     Reviewed by Merleen Nicely, LPN (Licensed Practical Nurse) on 05/01/23 at 1341  Med List Status: <None>   Medication Order Taking? Sig Documenting Provider Last Dose Status Informant  ALPRAZolam (XANAX) 0.5 MG tablet [id number] Yes TAKE 1 TABLET BY MOUTH EVERY DAY AS NEEDED FOR ANXIETY Arfeen, Phillips Grout, MD Taking Active   aspirin EC 81 MG tablet [id number] Yes Take 1 tablet (81 mg total) by mouth daily. Swallow whole. Arty Baumgartner, NP Taking Active     Discontinued 04/18/12 1755   doxycycline (VIBRAMYCIN) 100 MG capsule [id number] Yes Take 1 capsule (100 mg total) by mouth 2 (two) times daily. Renne Crigler, PA-C Taking Active   FLUoxetine (PROZAC) 20 MG capsule [id number] Yes Take 1 capsule (20 mg total) by mouth daily. Cleotis Nipper, MD Taking Active   NIFEdipine (PROCARDIA XL/NIFEDICAL XL) 60 MG 24 hr tablet [id number] Yes Take 1 tablet (60 mg total) by mouth daily. Discontinue valsartan Donita Brooks, MD Taking Active             Home Care and Equipment/Supplies: Were Home Health Services Ordered?: NA Any new  equipment or medical supplies ordered?: NA  Functional Questionnaire: Do you need assistance with bathing/showering or dressing?: No Do you need assistance with meal preparation?: No Do you need assistance with eating?: No Do you have difficulty maintaining continence: No Do you need assistance with getting out of bed/getting out of a chair/moving?: No Do you have difficulty managing or taking your medications?: No  Follow up appointments reviewed: PCP Follow-up appointment confirmed?: Yes Date of PCP follow-up appointment?: 05/08/23 Follow-up Provider: [facility] Follow-up appointment confirmed?: No Do you need transportation to your follow-up appointment?: No Do you understand care options if your condition(s) worsen?: Yes-patient verbalized understanding    SIGNATURE  Woodfin Ganja LPN [facility] Nurse Health Advisor Direct Dial [phone]

## 2023-05-08 ENCOUNTER — Inpatient Hospital Stay: Payer: BC Managed Care – PPO | Admitting: Family Medicine

## 2023-05-29 ENCOUNTER — Encounter (HOSPITAL_COMMUNITY): Payer: Self-pay | Admitting: Psychiatry

## 2023-05-29 ENCOUNTER — Telehealth (HOSPITAL_BASED_OUTPATIENT_CLINIC_OR_DEPARTMENT_OTHER): Payer: BC Managed Care – PPO | Admitting: Psychiatry

## 2023-05-29 VITALS — Wt 180.0 lb

## 2023-05-29 DIAGNOSIS — F319 Bipolar disorder, unspecified: Secondary | ICD-10-CM

## 2023-05-29 DIAGNOSIS — F419 Anxiety disorder, unspecified: Secondary | ICD-10-CM

## 2023-05-29 MED ORDER — ALPRAZOLAM 0.5 MG PO TABS: ORAL_TABLET | ORAL | 0 refills | Status: DC

## 2023-05-29 MED ORDER — FLUOXETINE HCL 20 MG PO CAPS: 20.0000 mg | ORAL_CAPSULE | Freq: Every day | ORAL | 0 refills | Status: DC

## 2023-05-29 NOTE — Progress Notes (Signed)
[facility] MD Virtual Progress Note   Patient Location: Work Provider Location: Home Office  I connect with patient by video and verified that I am speaking with correct person by using two identifiers. I discussed the limitations of evaluation and management by telemedicine and the availability of in person appointments. I also discussed with the patient that there may be a patient responsible charge related to this service. The patient expressed understanding and agreed to proceed.  Amanda Strong [id number] 42 y.o.  05/29/2023 2:24 PM  History of Present Illness:  Patient is evaluated by video session.  [name] at work.  She had a very good time with a family member for reunion at [place].  She reported school started and kids are back to school.  Her oldest is senior and younger is in fifth grade.  She reported to still have movements of emotions, irritability but denies any mania psychosis or any hallucination.  She admitted sometime unnecessary get overwhelmed when she thinks too much about the future.  [name] working at [employer] and gets tired..  She has no tremors, rash or any itching.  She started holistic therapy and that has been going well.  [name] seeing the therapist every 2 months.  Most of the nights she sleeps good.  One of her son takes medicine for behavioral issues.  She thinks about him a lot.  She reported her appetite is okay and weight is stable.  She has taken few times Xanax for anxiety and that had helped her a lot.  She denies drinking or using any illegal substances.  Past Psychiatric History: H/O mood swing, anger, depression, impulsive behavior, throwing things, damaging and punching walls, speeding tickets and THC use. H/O passive suicidal thoughts, emotional and verbal abuse. No h/o mania, psychosis, inpatient or suicidal attempt. Saw Valinda Hoar and tried Klonopin, Cymbalta, Zoloft, Lexapro, Prestiq, hydroxyzine, prozac and Seroquel  (Too sleepy). Lamictal caused rash, paxila and abilify did not work after some time.   Lithium could not tolerate.  Tr[employer] work but could not afford.     Outpatient Encounter Medications as of 05/29/2023  Medication Sig   ALPRAZolam (XANAX) 0.5 MG tablet TAKE 1 TABLET BY MOUTH EVERY DAY AS NEEDED FOR ANXIETY   aspirin EC 81 MG tablet Take 1 tablet (81 mg total) by mouth daily. Swallow whole.   doxycycline (VIBRAMYCIN) 100 MG capsule Take 1 capsule (100 mg total) by mouth 2 (two) times daily.   FLUoxetine (PROZAC) 20 MG capsule Take 1 capsule (20 mg total) by mouth daily.   NIFEdipine (PROCARDIA XL/NIFEDICAL XL) 60 MG 24 hr tablet Take 1 tablet (60 mg total) by mouth daily. Discontinue valsartan   [DISCONTINUED] desvenlafaxine (PRISTIQ) 50 MG 24 hr tablet Take 1 tablet (50 mg total) by mouth daily.   No facility-administered encounter medications on file as of 05/29/2023.    No results found for this or any previous visit (from the past 2160 hour(s)).   Psychiatric Specialty Exam: Physical Exam  Review of Systems  Weight 180 lb (81.6 kg), last menstrual period 04/14/2023.There is no height or weight on file to calculate BMI.  General Appearance: Casual  Eye Contact:  Good  Speech:  Clear and Coherent  Volume:  Normal  Mood:  Euthymic  Affect:  Appropriate  Thought Process:  Goal Directed  Orientation:  Full (Time, Place, and Person)  Thought Content:  Logical  Suicidal Thoughts:  No  Homicidal Thoughts:  No  Memory:  Immediate;   Good Recent;   Good Remote;   Good  Judgement:  Good  Insight:  Good  Psychomotor Activity:  Normal  Concentration:  Concentration: Good and Attention Span: Good  Recall:  Good  Fund of Knowledge:  Good  Language:  Good  Akathisia:  No  Handed:  Right  AIMS (if indicated):     Assets:  Communication Skills Desire for Improvement Housing Resilience Social Support Transportation  ADL's:  Intact  Cognition:  WNL  Sleep: Most of the night  good sleep     Assessment/Plan: Bipolar I disorder (HCC) - Plan: FLUoxetine (PROZAC) 20 MG capsule  Anxiety - Plan: FLUoxetine (PROZAC) 20 MG capsule, ALPRAZolam (XANAX) 0.5 MG tablet  Discussed current medication.  She reported most of the symptoms are manageable and stable however occasionally she has irritability and get emotional.  I encourage to consider seeing a holistic therapist more frequently and take deep breathing, listening music, walking when she have these moments of anxiety.  If symptoms do not get better that she can take the Xanax.  I explained benzodiazepine dependence, tolerance and withdrawal.  She did like Trintellix but could not afford as insurance does not cover.  [name] okay with current dose of Prozac 20 mg and she has no rash, tremors.  Recommend to call us back if any question or any concern.  Follow-up in 3 months   Follow Up Instructions:     I discussed the assessment and treatment plan with the patient. The patient was provided an opportunity to ask questions and all were answered. The patient agreed with the plan and demonstrated an understanding of the instructions.   The patient was advised to call back or seek an in-person evaluation if the symptoms worsen or if the condition fails to improve as anticipated.    Collaboration of Care: Other provider involved in patient's care AEB notes are available in epic to review.  Patient/Guardian was advised Release of Information must be obtained prior to any record release in order to collaborate their care with an outside provider. Patient/Guardian was advised if they have not already done so to contact the registration department to sign all necessary forms in order for [place] to release information regarding their care.   Consent: Patient/Guardian gives verbal consent for treatment and assignment of benefits for services provided during this visit. Patient/Guardian expressed understanding and agreed to proceed.      I provided 19 minutes of non face to face time during this encounter.  Note: This document was prepared by [employer] voice dictation technology and any errors that results from this process are unintentional.    Cleotis Nipper, MD 05/29/2023

## 2023-06-02 ENCOUNTER — Encounter: Payer: Self-pay | Admitting: Family Medicine

## 2023-07-04 ENCOUNTER — Telehealth (HOSPITAL_COMMUNITY): Payer: Self-pay | Admitting: *Deleted

## 2023-07-04 DIAGNOSIS — F419 Anxiety disorder, unspecified: Secondary | ICD-10-CM

## 2023-07-04 MED ORDER — ALPRAZOLAM 0.5 MG PO TABS: ORAL_TABLET | ORAL | 0 refills | Status: DC

## 2023-07-04 NOTE — Telephone Encounter (Signed)
Pt called requesting a refill of the Xanax 0.5 mg tabs. Last e-scribed on 05/29/23 for #20. Pt last visit  was on 05/29/23 and she has f/u scheduled for 08/28/23. Please review and advise.

## 2023-07-04 NOTE — Telephone Encounter (Signed)
Done

## 2023-08-28 ENCOUNTER — Encounter (HOSPITAL_COMMUNITY): Payer: Self-pay | Admitting: Psychiatry

## 2023-08-28 ENCOUNTER — Telehealth (HOSPITAL_BASED_OUTPATIENT_CLINIC_OR_DEPARTMENT_OTHER): Payer: BC Managed Care – PPO | Admitting: Psychiatry

## 2023-08-28 VITALS — Wt 171.0 lb

## 2023-08-28 DIAGNOSIS — F319 Bipolar disorder, unspecified: Secondary | ICD-10-CM | POA: Diagnosis not present

## 2023-08-28 DIAGNOSIS — F410 Panic disorder [episodic paroxysmal anxiety] without agoraphobia: Secondary | ICD-10-CM

## 2023-08-28 MED ORDER — FLUOXETINE HCL 40 MG PO CAPS: 40.0000 mg | ORAL_CAPSULE | Freq: Every day | ORAL | 1 refills | Status: DC

## 2023-08-28 MED ORDER — CLONAZEPAM 0.5 MG PO TABS: 0.5000 mg | ORAL_TABLET | Freq: Every day | ORAL | 1 refills | Status: DC

## 2023-08-28 NOTE — Progress Notes (Signed)
[facility] MD Virtual Progress Note   Patient Location: Home Provider Location: Home Office  I connect with patient by video and verified that I am speaking with correct person by using two identifiers. I discussed the limitations of evaluation and management by telemedicine and the availability of in person appointments. I also discussed with the patient that there may be a patient responsible charge related to this service. The patient expressed understanding and agreed to proceed.  Amanda Strong [id number] 42 y.o.  08/28/2023 2:20 PM  History of Present Illness:  Patient is evaluated by video session.  She reported a lot of stress and anxiety and started noticed irritability, frustration, having more frequent panic attacks.  [name] taking Xanax more frequently.  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  Patient told when she applied she was hoping to get surgical floor but no position was available and she accepted working in emergency room.  She did not like because shift is 3 PM to 3 AM.  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  Patient noticed increased emotional, tearful, crying and having ups and downs.  She admitted also not compliant with the therapy which was helping but now considering to go back.  [name] taking Prozac 20 mg and Xanax as needed but realize she is taking more frequently which she does not want.  She had good but they as she spent time with her family and friends.  She had a very good support from her oldest son who is a [employer] representative.  She admitted some anxiety because not sure where he will go in the future.  She has another son who is in fifth grade.  She denies any hallucination, paranoia or any suicidal thoughts.  She denies any aggression or violence.  She denies drinking or using any illegal substances.  Past Psychiatric History: H/O mood swing, anger,  depression, impulsive behavior, throwing things, damaging and punching walls, speeding tickets and THC use. H/O passive suicidal thoughts, emotional and verbal abuse. No h/o mania, psychosis, inpatient or suicidal attempt. Saw Valinda Hoar and tried Klonopin, Cymbalta, Zoloft, Lexapro, Prestiq, hydroxyzine, prozac and Seroquel (Too sleepy). Lamictal caused rash, paxila and abilify did not work after some time.   Lithium could not tolerate.  Tr[employer]ell[employer] work but could not afford.    Outpatient Encounter Medications as of 08/28/2023  Medication Sig   ALPRAZolam (XANAX) 0.5 MG tablet TAKE 1 TABLET BY MOUTH EVERY DAY AS NEEDED FOR ANXIETY   FLUoxetine (PROZAC) 20 MG capsule Take 1 capsule (20 mg total) by mouth daily.   NIFEdipine (PROCARDIA XL/NIFEDICAL XL) 60 MG 24 hr tablet Take 1 tablet (60 mg total) by mouth daily. Discontinue valsartan   [DISCONTINUED] desvenlafaxine (PRISTIQ) 50 MG 24 hr tablet Take 1 tablet (50 mg total) by mouth daily.   No facility-administered encounter medications on file as of 08/28/2023.    No results found for this or any previous visit (from the past 2160 hour(s)).   Psychiatric Specialty Exam: Physical Exam  Review of Systems  Psychiatric/Behavioral:  Positive for sleep disturbance. The patient is nervous/anxious.     Weight 171 lb (77.6 kg).There is no height or weight on file to calculate BMI.  General Appearance: Casual  Eye Contact:  Good, tearful  Speech:  Clear and Coherent and Normal Rate  Volume:  Normal  Mood:  Anxious and  Dysphoric, emotional  Affect:  Congruent  Thought Process:  Goal Directed  Orientation:  Full (Time, Place, and Person)  Thought Content:  Rumination  Suicidal Thoughts:  No  Homicidal Thoughts:  No  Memory:  Immediate;   Good Recent;   Good Remote;   Good  Judgement:  Intact  Insight:  Present  Psychomotor Activity:  Normal  Concentration:  Concentration: Good and Attention Span: Good  Recall:  Good  Fund of Knowledge:   Good  Language:  Good  Akathisia:  No  Handed:  Right  AIMS (if indicated):     Assets:  Communication Skills Desire for Improvement Housing Talents/Skills Transportation  ADL's:  Intact  Cognition:  WNL  Sleep:  fair     Assessment/Plan: Panic attacks - Plan: clonazePAM (KLONOPIN) 0.5 MG tablet  Bipolar I disorder (HCC) - Plan: FLUoxetine (PROZAC) 40 MG capsule, clonazePAM (KLONOPIN) 0.5 MG tablet  Discussed psychosocial stressors.  Patient not sure with her current job.  [name] on 18 month program in the emergency room at [facility] care but in the future would like to work on the surgical floor.  I talk about current medication.  [name] taking  Xanax more frequently.  In the past she had a good response with Lamictal but when she restarted it causes itching and rash.  Seroquel helped but it makes her very sleepy and groggy.  Recommend to increase Prozac 40 mg to help mood lability and try Klonopin.  She admitted in the past has not given serious compliance with the Klonopin but now she is open to reconsider.  I explained the difference between Xanax or Klonopin as longer half-life and also to prevent the panic attacks.  If higher dose of Prozac did not help we will consider adding a low dose REXULTI.  In the past Trintellix had worked very well for her but insurance does not cover.  I also encouraged to consider going back to therapy.  She did very well with the holistic therapist but due to her busy schedule has not been consistent.  Discussed medication side effects and benefits.  Recommend to call us back if she has any question.  Discussed safety concerns and any time having active suicidal thoughts or homicidal thought then she need to call 911 or go to local emergency room.  Follow-up in 6 weeks.   Follow Up Instructions:     I discussed the assessment and treatment plan with the patient. The patient was provided an opportunity to ask questions and all were answered. The  patient agreed with the plan and demonstrated an understanding of the instructions.   The patient was advised to call back or seek an in-person evaluation if the symptoms worsen or if the condition fails to improve as anticipated.    Collaboration of Care: Other provider involved in patient's care AEB notes are available in epic to review  Patient/Guardian was advised Release of Information must be obtained prior to any record release in order to collaborate their care with an outside provider. Patient/Guardian was advised if they have not already done so to contact the registration department to sign all necessary forms in order for [place] to release information regarding their care.   Consent: Patient/Guardian gives verbal consent for treatment and assignment of benefits for services provided during this visit. Patient/Guardian expressed understanding and agreed to proceed.     I provided 27 minutes of non face to face time during this encounter.  Note: This document  was prepared by [employer] and any errors that results from this process are unintentional.    Cleotis Nipper, MD 08/28/2023

## 2023-09-04 ENCOUNTER — Telehealth: Payer: BC Managed Care – PPO

## 2023-09-04 DIAGNOSIS — B3731 Acute candidiasis of vulva and vagina: Secondary | ICD-10-CM | POA: Diagnosis not present

## 2023-09-05 MED ORDER — FLUCONAZOLE 150 MG PO TABS: 150.0000 mg | ORAL_TABLET | Freq: Every day | ORAL | 0 refills | Status: DC

## 2023-09-05 NOTE — Progress Notes (Signed)

## 2023-09-05 NOTE — Progress Notes (Signed)
I have spent 5 minutes in review of e-visit questionnaire, review and updating patient chart, medical decision making and response to patient.   Mia Milan Cody Jacklynn Dehaas, PA-C    

## 2023-09-11 ENCOUNTER — Telehealth: Payer: Self-pay | Admitting: Family Medicine

## 2023-09-11 DIAGNOSIS — N760 Acute vaginitis: Secondary | ICD-10-CM

## 2023-09-11 NOTE — Progress Notes (Signed)
Because you were just treated for yeast infection last week, I feel your condition warrants further evaluation and I recommend that you be seen in a face to face visit.   NOTE: There will be NO CHARGE for this eVisit   If you are having a true medical emergency please call 911.

## 2023-09-16 ENCOUNTER — Telehealth: Payer: Managed Care, Other (non HMO) | Admitting: Family

## 2023-09-16 DIAGNOSIS — B3731 Acute candidiasis of vulva and vagina: Secondary | ICD-10-CM

## 2023-09-17 MED ORDER — FLUCONAZOLE 150 MG PO TABS: 150.0000 mg | ORAL_TABLET | ORAL | 0 refills | Status: DC | PRN

## 2023-09-17 NOTE — Progress Notes (Signed)

## 2023-09-21 ENCOUNTER — Ambulatory Visit: Payer: Self-pay | Admitting: Family Medicine

## 2023-09-21 ENCOUNTER — Encounter: Payer: Self-pay | Admitting: Family Medicine

## 2023-09-21 ENCOUNTER — Other Ambulatory Visit: Payer: Self-pay | Admitting: Family Medicine

## 2023-09-21 VITALS — BP 152/98 | HR 74 | Temp 98.2°F | Ht 67.0 in | Wt 171.1 lb

## 2023-09-21 DIAGNOSIS — I10 Essential (primary) hypertension: Secondary | ICD-10-CM

## 2023-09-21 MED ORDER — LOSARTAN POTASSIUM 50 MG PO TABS: 50.0000 mg | ORAL_TABLET | Freq: Every day | ORAL | 3 refills | Status: DC

## 2023-09-21 NOTE — Progress Notes (Signed)
 Subjective:    Patient ID: Amanda Strong, female    DOB: 17-Oct-1980, 43 y.o.   MRN: [id number]  Hypertension   Patient is currently taking nifedipine  60 mg a day.  Blood pressure is elevated.  [name] consistently seeing systolic blood pressures in the 150 range at home.  [name] dealing with a lot of stress and anxiety.  She has an appointment tomorrow to discuss this with her psychiatrist.  [name] also dealing with severe acid reflux.  She has a history of renal artery stenosis secondary to fibromuscular dysplasia.  Cardiology treated this with angioplasty.  She has an appointment to see them in March to repeat an ultrasound of the renal artery.  [name] concerned why her blood pressure is high again.  I am concerned that stress could be adding to her blood pressure. Past Medical History:  Diagnosis Date   Abnormal Pap smear    Acne    Allergy    Anxiety    Depression    Group B streptococcal infection in pregnancy 10/28/2012   History of chicken pox    Hypertension    Hyperthyroidism    LGSIL (low grade squamous intraepithelial dysplasia)    Dr. Estelle   Normal pregnancy, repeat 10/28/2012   Past Surgical History:  Procedure Laterality Date   BUNIONECTOMY     FOOT SURGERY     PERIPHERAL VASCULAR BALLOON ANGIOPLASTY  06/13/2022   Procedure: PERIPHERAL VASCULAR BALLOON ANGIOPLASTY;  Surgeon: Court Dorn PARAS, MD;  Location: [facility] INVASIVE CV LAB;  Service: Cardiovascular;;   RENAL ANGIOGRAPHY N/A 06/13/2022   Procedure: RENAL ANGIOGRAPHY;  Surgeon: Court Dorn PARAS, MD;  Location: [facility] INVASIVE CV LAB;  Service: Cardiovascular;  Laterality: N/A;   WISDOM TOOTH EXTRACTION     Current Outpatient Medications on File Prior to Visit  Medication Sig Dispense Refill   clonazePAM  (KLONOPIN ) 0.5 MG tablet Take 1 tablet (0.5 mg total) by mouth at bedtime. 30 tablet 1   FLUoxetine  (PROZAC ) 40 MG capsule Take 1 capsule (40 mg total) by mouth daily. 30 capsule 1   NIFEdipine  (PROCARDIA   XL/NIFEDICAL XL) 60 MG 24 hr tablet Take 1 tablet (60 mg total) by mouth daily. Discontinue valsartan  30 tablet 5   ALPRAZolam  (XANAX ) 0.5 MG tablet TAKE 1 TABLET BY MOUTH EVERY DAY AS NEEDED FOR ANXIETY (Patient not taking: Reported on 09/21/2023) 20 tablet 0   fluconazole  (DIFLUCAN ) 150 MG tablet Take 1 tablet (150 mg total) by mouth every three (3) days as needed. (Patient not taking: Reported on 09/21/2023) 2 tablet 0   [DISCONTINUED] desvenlafaxine  (PRISTIQ ) 50 MG 24 hr tablet Take 1 tablet (50 mg total) by mouth daily. 21 tablet 0   No current facility-administered medications on file prior to visit.   Allergies  Allergen Reactions   Cephalexin     REACTION: intense itching all over   Lamictal  [Lamotrigine ] Hives and Itching   Oxycodone  Itching   Social History   Socioeconomic History   Marital status: Single    Spouse name: Not on file   Number of children: 1   Years of education: 16   Highest education level: Not on file  Occupational History   Occupation: WOUND CARE NURSES    Employer: [employer]  Tobacco Use   Smoking status: Former    Current packs/day: 0.00    Types: Cigarettes    Quit date: 10/22/2008    Years since quitting: 14.9   Smokeless tobacco: Never  Vaping  Use   Vaping status: Never Used  Substance and Sexual Activity   Alcohol use: No    Alcohol/week: 0.0 standard drinks of alcohol    Comment: occasionally   Drug use: No   Sexual activity: Yes    Partners: Male    Birth control/protection: Condom, I.U.D.  Other Topics Concern   Not on file  Social History Narrative   Caffeine Use-yes   Regular exercise-no      Entered 03/2014:   Single Mom of 2 children   Children are:      Girl--41 months old      Boy--43 years old   She works at [employer].    Currently works with wound care   Social Drivers of Health   Financial Resource Strain: Not on file  Food Insecurity: Not on file  Transportation Needs: Not on file   Physical Activity: Not on file  Stress: Not on file  Social Connections: Not on file  Intimate Partner Violence: Not on file      Review of Systems  All other systems reviewed and are negative.      Objective:   Physical Exam Vitals reviewed.  Constitutional:      General: [name] not in acute distress.    Appearance: Normal appearance. [name] not ill-appearing or toxic-appearing.  Cardiovascular:     Rate and Rhythm: Normal rate and regular rhythm.     Pulses: Normal pulses.     Heart sounds: Normal heart sounds. No murmur heard.    No friction rub. No gallop.  Pulmonary:     Effort: Pulmonary effort is normal. No respiratory distress.     Breath sounds: No stridor. No wheezing, rhonchi or rales.  Musculoskeletal:     Right lower leg: No edema.     Left lower leg: No edema.  Neurological:     Mental Status: [name] alert.          Assessment & Plan:   Benign essential HTN - Plan: CBC with Differential/Platelet, COMPLETE METABOLIC PANEL WITH GFR Add losartan  50 mg daily to nifedipine .  Recheck blood pressure in 3 weeks.  Pain CMP and CBC today.  Start prilosec 20 mg daily for 2 weeks acid for reflux.  Follow-up with psychiatry tomorrow.  At first, the patient seemed to have pressured speech with tangential thoughts.  She seemed extremely anxious and agitated when she arrived to clinic.  However the longer she sat to talked with me the, the calmer she became.  She was running late to the appointment.  Initially I mentioned possibly to starting a mood stabilizer.  However after talking with the patient, I feel that this is not necessary.  I will defer to her psychiatrist's expert opinion

## 2023-09-22 LAB — CBC WITH DIFFERENTIAL/PLATELET
Absolute Lymphocytes: 2951 {cells}/uL (ref 850–3900)
Absolute Monocytes: 1012 {cells}/uL — ABNORMAL HIGH (ref 200–950)
Basophils Absolute: 36 {cells}/uL (ref 0–200)
Basophils Relative: 0.3 %
Eosinophils Absolute: 95 {cells}/uL (ref 15–500)
Eosinophils Relative: 0.8 %
HCT: 43.1 % (ref 38.5–45.0)
Hemoglobin: 14.5 g/dL (ref 13.2–15.5)
MCH: 30.9 pg (ref 27.0–33.0)
MCHC: 33.6 g/dL (ref 32.0–36.0)
MCV: 91.7 fL (ref 80.0–100.0)
MPV: 12.6 fL — ABNORMAL HIGH (ref 7.5–12.5)
Monocytes Relative: 8.5 %
Neutro Abs: 7806 {cells}/uL — ABNORMAL HIGH (ref 1500–7800)
Neutrophils Relative %: 65.6 %
Platelets: 247 10*3/uL (ref 140–400)
RBC: 4.7 10*6/uL (ref 4.20–5.10)
RDW: 12 % (ref 11.0–15.0)
Total Lymphocyte: 24.8 %
WBC: 11.9 10*3/uL — ABNORMAL HIGH (ref 3.8–10.8)

## 2023-09-22 LAB — COMPLETE METABOLIC PANEL WITH GFR
AG Ratio: 1.7 (calc) (ref 1.0–2.5)
ALT: 12 U/L
AST: 12 U/L (ref 10–40)
Albumin: 4.5 g/dL (ref 3.6–5.1)
Alkaline phosphatase (APISO): 67 U/L
BUN: 12 mg/dL (ref 7–25)
CO2: 23 mmol/L (ref 20–32)
Calcium: 9.3 mg/dL
Chloride: 105 mmol/L (ref 98–110)
Creat: 0.84 mg/dL
Globulin: 2.7 g/dL (ref 1.9–3.7)
Glucose, Bld: 103 mg/dL — ABNORMAL HIGH (ref 65–99)
Potassium: 3.8 mmol/L (ref 3.5–5.3)
Sodium: 137 mmol/L (ref 135–146)
Total Bilirubin: 0.6 mg/dL (ref 0.2–1.2)
Total Protein: 7.2 g/dL (ref 6.1–8.1)

## 2023-09-22 LAB — SPECIMEN COMPROMISED

## 2023-09-22 LAB — HOUSE ACCOUNT TRACKING

## 2023-09-22 LAB — DUPLICATE REPORT

## 2023-09-28 ENCOUNTER — Other Ambulatory Visit (HOSPITAL_COMMUNITY): Payer: Self-pay | Admitting: Psychiatry

## 2023-09-28 DIAGNOSIS — F319 Bipolar disorder, unspecified: Secondary | ICD-10-CM

## 2023-10-02 NOTE — Telephone Encounter (Signed)
 Copied from CRM [id number]. Topic: Clinical - Medical Advice >> Oct 02, 2023  1:06 PM Delon T wrote: Reason for CRM: Left leg, pulled something and having more pain. Dr. Duanne told her she can start physical therapy.  Wants to know if she needs to come in or just schedule for physical therapy.  Please call patient [phone]

## 2023-10-23 ENCOUNTER — Encounter: Payer: Self-pay | Admitting: Family Medicine

## 2023-10-25 ENCOUNTER — Encounter: Payer: Self-pay | Admitting: Family Medicine

## 2023-10-25 ENCOUNTER — Ambulatory Visit: Payer: Managed Care, Other (non HMO) | Admitting: Family Medicine

## 2023-10-25 VITALS — BP 128/86 | HR 81 | Temp 98.5°F | Ht 67.0 in | Wt 177.2 lb

## 2023-10-25 DIAGNOSIS — N898 Other specified noninflammatory disorders of vagina: Secondary | ICD-10-CM

## 2023-10-25 DIAGNOSIS — L209 Atopic dermatitis, unspecified: Secondary | ICD-10-CM

## 2023-10-25 DIAGNOSIS — N760 Acute vaginitis: Secondary | ICD-10-CM | POA: Diagnosis not present

## 2023-10-25 DIAGNOSIS — B9689 Other specified bacterial agents as the cause of diseases classified elsewhere: Secondary | ICD-10-CM | POA: Diagnosis not present

## 2023-10-25 LAB — WET PREP FOR TRICH, YEAST, CLUE

## 2023-10-25 MED ORDER — METRONIDAZOLE 0.75 % VA GEL: 1.0000 | Freq: Every day | VAGINAL | 1 refills | Status: AC

## 2023-10-25 NOTE — Progress Notes (Signed)
 Patient Office Visit  Assessment & Plan:  Bacterial vaginosis -     WET PREP FOR TRICH, YEAST, CLUE -     metroNIDAZOLE ; Place 1 Applicatorful vaginally at bedtime for 5 days.  Dispense: 70 g; Refill: 1  Atopic dermatitis, unspecified type  Vaginal itching -     WET PREP FOR TRICH, YEAST, CLUE   Wet prep showed clue cells.  No yeast seen.  Patient made aware this.  MetroGel  nightly for 5 days 1 refill.  If no improvement she is to notify us . Return if symptoms worsen or fail to improve.   Subjective:    Patient ID: Amanda Strong, female    DOB: 18-May-1981  Age: 43 y.o. MRN: [id number]  Chief Complaint  Patient presents with   Vaginal Itching    On flagyl  til Thursday. Pt has taken several rounds of diflucan  and flagyl .     HPI Vaginal Yeast/Vaginal irritation-patient has seen her OB/GYN Dr. Martyn sometime end of December.  Patient was diagnosed with yeast infection and got Diflucan .  Patient also was given a prescription for Flagyl  and is about to finish it this Thursday but not doing any better.  Patient does not have concerns for STIs and did have a an extensive panel which was negative.  Patient also got a prescription for steroid cream triamcinolone for the vaginal irritation but does not think it helped.,  Patient also got a prescription for boric Acid Suppositories told to stop it.. Pt had more in depth Nu-Swab yield positive yeast and nothing else.  (last day of Flagy will be this Thursday).  Patient continues with vaginal irritation and brownish discharge and is most very frustrated on what to do next.  Patient is in a new relationship but has been checked for STIs and all were negative.  Patient does have a new Mirena IUD and is wondering if that is contributing to current symptoms.  No urinary symptoms no gross hematuria no back discomfort.  No fever or chills.  Atopic dermatitis/rash - lesions over legs, first appeared over the right thumb/right hand since past weekend.  They are scaly patches that are itchy.  Does have the triamcinolone cream at home.  Patient wonders if it is in the allergic reaction to Flagyl .  The 10-year ASCVD risk score (Arnett DK, et al., 2019) is: 1.8%  Past Medical History:  Diagnosis Date   Abnormal Pap smear    Acne    Allergy    Anxiety    Depression    Group B streptococcal infection in pregnancy 10/28/2012   History of chicken pox    Hypertension    Hyperthyroidism    LGSIL (low grade squamous intraepithelial dysplasia)    Dr. Estelle   Normal pregnancy, repeat 10/28/2012   Past Surgical History:  Procedure Laterality Date   BUNIONECTOMY     FOOT SURGERY     PERIPHERAL VASCULAR BALLOON ANGIOPLASTY  06/13/2022   Procedure: PERIPHERAL VASCULAR BALLOON ANGIOPLASTY;  Surgeon: Court Dorn PARAS, MD;  Location: [facility] INVASIVE CV [facility];  Service: Cardiovascular;;   RENAL ANGIOGRAPHY N/A 06/13/2022   Procedure: RENAL ANGIOGRAPHY;  Surgeon: Court Dorn PARAS, MD;  Location: [facility] INVASIVE CV LAB;  Service: Cardiovascular;  Laterality: N/A;   WISDOM TOOTH EXTRACTION     Social History   Tobacco Use   Smoking status: Former    Current packs/day: 0.00    Types: Cigarettes    Quit date: 10/22/2008    Years since quitting: 15.0  Smokeless tobacco: Never  Vaping Use   Vaping status: Never Used  Substance Use Topics   Alcohol use: No    Alcohol/week: 0.0 standard drinks of alcohol    Comment: occasionally   Drug use: No   Family History  Problem Relation Age of Onset   Hypertension Mother    Cancer Mother        breast   Hypertension Father    Arthritis Father    Hypertension Maternal Grandmother    Arthritis Maternal Grandmother    Hyperlipidemia Maternal Grandmother    Stroke Maternal Grandfather    Alcohol abuse Maternal Grandfather    Arthritis Maternal Grandfather    Hypertension Paternal Grandmother    Heart disease Paternal Grandmother    Hypertension Paternal Grandfather    Hypertension Maternal Aunt     Hypertension Maternal Uncle    Hypertension Paternal Aunt    Hypertension Paternal Uncle    Allergies  Allergen Reactions   Cephalexin     REACTION: intense itching all over   Lamictal  [Lamotrigine ] Hives and Itching   Oxycodone  Itching    ROS    Objective:    BP 128/86   Pulse 81   Temp 98.5 F (36.9 C)   Ht 5' 7 (1.702 m)   Wt 177 lb 4 oz (80.4 kg)   LMP 10/23/2023 (Exact Date)   SpO2 99%   BMI 27.76 kg/m  BP Readings from Last 3 Encounters:  10/25/23 128/86  09/21/23 (!) 152/98  04/30/23 (!) 163/105   Wt Readings from Last 3 Encounters:  10/25/23 177 lb 4 oz (80.4 kg)  09/21/23 171 lb 2 oz (77.6 kg)  12/23/22 175 lb 6.4 oz (79.6 kg)    Physical Exam Vitals and nursing note reviewed.  Constitutional:      Appearance: Normal appearance.  HENT:     Head: Normocephalic.  Eyes:     Pupils: Pupils are equal, round, and reactive to light.  Cardiovascular:     Rate and Rhythm: Normal rate and regular rhythm.  Abdominal:     General: Bowel sounds are normal.     Tenderness: There is no abdominal tenderness. There is no guarding or rebound.  Genitourinary:    General: Normal vulva.     Vagina: No erythema, tenderness, bleeding or lesions.     Cervix: Discharge present. No friability or erythema.     Comments: Patient has IUD string in place.  Patient has a brownish discharge.  No cottage cheeselike discharge noted.  Wet prep obtained. Skin:    Findings: Rash present.     Comments: Patient has scattered ovalish scaly 1 cm lesions right hand and  lower legs.  Neurological:     General: No focal deficit present.     Mental Status: [name] alert and oriented to person, place, and time.  Psychiatric:        Mood and Affect: Mood normal.        Behavior: Behavior normal.      Results for orders placed or performed in visit on 10/25/23  WET PREP FOR TRICH, YEAST, CLUE   Specimen: Genital  Result Value Ref Range   Source: GENITAL    RESULT

## 2023-10-31 ENCOUNTER — Other Ambulatory Visit: Payer: Self-pay | Admitting: Family Medicine

## 2023-11-22 ENCOUNTER — Other Ambulatory Visit (HOSPITAL_COMMUNITY): Payer: Self-pay | Admitting: Psychiatry

## 2023-11-22 ENCOUNTER — Encounter (HOSPITAL_COMMUNITY): Payer: Self-pay

## 2023-11-22 ENCOUNTER — Ambulatory Visit (HOSPITAL_COMMUNITY)
Admission: RE | Admit: 2023-11-22 | Discharge: 2023-11-22 | Disposition: A | Discharge status: Home / Self Care | Payer: Managed Care, Other (non HMO) | Source: Ambulatory Visit | Attending: Cardiology | Admitting: Cardiology

## 2023-11-22 DIAGNOSIS — F410 Panic disorder [episodic paroxysmal anxiety] without agoraphobia: Secondary | ICD-10-CM

## 2023-11-22 DIAGNOSIS — I150 Renovascular hypertension: Secondary | ICD-10-CM

## 2023-11-22 DIAGNOSIS — F319 Bipolar disorder, unspecified: Secondary | ICD-10-CM

## 2023-11-23 ENCOUNTER — Telehealth: Payer: Self-pay | Admitting: Cardiovascular Disease

## 2023-11-23 ENCOUNTER — Other Ambulatory Visit (HOSPITAL_COMMUNITY): Payer: Self-pay

## 2023-11-23 DIAGNOSIS — F319 Bipolar disorder, unspecified: Secondary | ICD-10-CM

## 2023-11-23 DIAGNOSIS — F410 Panic disorder [episodic paroxysmal anxiety] without agoraphobia: Secondary | ICD-10-CM

## 2023-11-23 MED ORDER — CLONAZEPAM 0.5 MG PO TABS: 0.5000 mg | ORAL_TABLET | Freq: Every day | ORAL | 0 refills | Status: DC

## 2023-11-23 MED ORDER — FLUOXETINE HCL 40 MG PO CAPS: 40.0000 mg | ORAL_CAPSULE | Freq: Every day | ORAL | 0 refills | Status: DC

## 2023-11-23 NOTE — Telephone Encounter (Signed)
 Pt requesting a call for renal US results. She works 3rd shift so she said if she doesn't answer to leave her a detailed message on voicemail

## 2023-11-24 NOTE — Telephone Encounter (Signed)
 Carlos Levering, NP 11/24/2023  9:46 AM EST     Please let patient know renal US showed some progression of stenosis in the renal arteries. How has her BP been running? It does not look like she is scheduled for a follow up. May need to get her in with Dr. Allyson Sabal or APP.   Thank you!   DW   Patient identification verified by 2 forms. Marilynn Rail, RN    Called and spoke to patient  Patient states:   -BP continues to be elevated   -Blood pressure range: 130-140/80-90  -does not keep a BP log   -continues to have occasional head  -after procedure plavix and lipitor was d/c  -Takes Losartan 50mg  and Nifedipine  Patient denies:   -Chest pain   -SOB  Patient scheduled for next available OV 3/17 at 2:15pm  Patient has no questions or concerns at this time

## 2023-11-24 NOTE — Telephone Encounter (Signed)
 Patient is following up due to not hearing back from anyone yet.

## 2023-11-28 ENCOUNTER — Encounter: Payer: Self-pay | Admitting: Cardiovascular Disease

## 2023-12-01 ENCOUNTER — Encounter: Payer: Self-pay | Admitting: Family Medicine

## 2023-12-01 ENCOUNTER — Telehealth (HOSPITAL_BASED_OUTPATIENT_CLINIC_OR_DEPARTMENT_OTHER): Payer: Self-pay | Admitting: Psychiatry

## 2023-12-01 ENCOUNTER — Encounter (HOSPITAL_COMMUNITY): Payer: Self-pay | Admitting: Psychiatry

## 2023-12-01 VITALS — Wt 177.0 lb

## 2023-12-01 DIAGNOSIS — F410 Panic disorder [episodic paroxysmal anxiety] without agoraphobia: Secondary | ICD-10-CM | POA: Diagnosis not present

## 2023-12-01 DIAGNOSIS — F319 Bipolar disorder, unspecified: Secondary | ICD-10-CM | POA: Diagnosis not present

## 2023-12-01 MED ORDER — FLUOXETINE HCL 40 MG PO CAPS: 40.0000 mg | ORAL_CAPSULE | Freq: Every day | ORAL | 2 refills | Status: DC

## 2023-12-01 MED ORDER — CLONAZEPAM 0.5 MG PO TABS: 0.5000 mg | ORAL_TABLET | Freq: Every day | ORAL | 2 refills | Status: DC | PRN
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
## 2023-12-04 ENCOUNTER — Encounter: Payer: Self-pay | Admitting: Emergency Medicine

## 2023-12-04 ENCOUNTER — Other Ambulatory Visit: Payer: Self-pay | Admitting: Emergency Medicine

## 2023-12-04 ENCOUNTER — Ambulatory Visit: Attending: Emergency Medicine | Admitting: Emergency Medicine

## 2023-12-04 VITALS — BP 120/84 | HR 84 | Ht 67.0 in | Wt 171.0 lb

## 2023-12-04 DIAGNOSIS — I150 Renovascular hypertension: Secondary | ICD-10-CM | POA: Diagnosis not present

## 2023-12-04 DIAGNOSIS — I773 Arterial fibromuscular dysplasia: Secondary | ICD-10-CM

## 2023-12-04 DIAGNOSIS — E785 Hyperlipidemia, unspecified: Secondary | ICD-10-CM | POA: Diagnosis not present

## 2023-12-04 MED ORDER — ASPIRIN 81 MG PO CAPS: 81.0000 mg | ORAL_CAPSULE | Freq: Every day | ORAL | 1 refills | Status: DC

## 2023-12-04 MED ORDER — LOSARTAN POTASSIUM 100 MG PO TABS: 100.0000 mg | ORAL_TABLET | Freq: Every day | ORAL | 1 refills | Status: DC

## 2023-12-04 MED ORDER — ATORVASTATIN CALCIUM 40 MG PO TABS: 40.0000 mg | ORAL_TABLET | Freq: Every day | ORAL | 1 refills | Status: DC

## 2023-12-04 NOTE — Patient Instructions (Addendum)
 Medication Instructions:  START ATORVASTATIN 40 MG DAILY.  START ASPIRIN 81 MG DAILY. INCREASE LOSARTAN TO 100 MG DAILY.   Lab Work: LIPID PANEL AND CMET   Testing/Procedures: NONE   Follow-Up: At [facility], you and your health needs are our priority.  As part of our continuing mission to provide you with exceptional heart care, we have created designated Provider Care Teams.  These Care Teams include your primary Cardiologist (physician) and Advanced Practice Providers (APPs -  Physician Assistants and Nurse Practitioners) who all work together to provide you with the care you need, when you need it.   Your next appointment:   KEEP UPCOMING APPOINTMENT  Provider:   Nanetta Batty, MD   Other Instructions:

## 2023-12-04 NOTE — Progress Notes (Addendum)
 Cardiology Office Note:    Date:  12/04/2023  ID:  Amanda Strong, DOB Sep 05, 1981, MRN [id number] PCP: Donita Brooks, MD  [facility] Providers Cardiologist:  Nanetta Batty, MD       Patient Profile:      Chief Complaint: 1 year follow-up for renovascular HTN   History of Present Illness:  Amanda Strong is a 43 y.o. female with visit-pertinent history of renovascular hypertension, hyperlipidemia  She was first evaluated by Dr. Allyson Sabal on 05/18/2022 for renovascular hypertension.  She has had hypertension since birth of her last child 14 years prior.  She been resistant on 2 medications so PCP performed renal ultrasound which was suggestive of possible fibromuscular dysplasia on the right.  CTA abdomen pelvis was suggestive of focal FMD of the right renal artery.  Patient underwent PTA with 5mm balloon of right renal artery FMD on 05/2022.  Carotid ultrasound was performed to rule out carotid FMD, which was normal.  She was last seen in office on 12/23/2022.  She was doing well at the time and BP at home typically 130s to 140s.  She was changed from valsartan to nifedipine by Dr. Tanya Nones due to possibility of getting pregnant.  Her Plavix, aspirin, Lipitor were discontinued on 12/2022 as patient would like to move forward with family-planning.  Most recent renal artery ultrasound was completed on 11/22/2023 showing 1-59% stenosis of bilateral renal arteries with beadlike appearance with elevated systolic and diastolic velocities suggestive of fibromuscular dysplasia in the renal arteries.   Discussed the use of AI scribe software for clinical note transcription with the patient, who gave verbal consent to proceed.  The patient comes into clinic today by herself and presents with concerns about her high blood pressure and recent renal ultrasound results. She reports inconsistent but generally high blood pressure readings, with values averaging 140s to 160s systolic. The patient also  experiences frequent headaches, which have been increasing in frequency and are not relieved by Tylenol or Motrin. She also reports bouts of frequent urination, with some days where she feels the need to urinate every hour, regardless of fluid intake.  In addition to her blood pressure concerns, the patient also has questions about her fibromuscular dysplasia. [name] particularly interested in understanding more about the condition and its implications for her health.     Review of systems:  Please see the history of present illness. All other systems are reviewed and otherwise negative.     Home Medications:    Current Meds  Medication Sig   Aspirin 81 MG CAPS Take 81 mg by mouth daily.   atorvastatin (LIPITOR) 40 MG tablet Take 1 tablet (40 mg total) by mouth daily.   clonazePAM (KLONOPIN) 0.5 MG tablet Take 1 tablet (0.5 mg total) by mouth daily as needed for anxiety.   fluconazole (DIFLUCAN) 150 MG tablet Take 1 tablet (150 mg total) by mouth every three (3) days as needed.   FLUoxetine (PROZAC) 40 MG capsule Take 1 capsule (40 mg total) by mouth daily.   losartan (COZAAR) 100 MG tablet Take 1 tablet (100 mg total) by mouth daily.   metroNIDAZOLE (FLAGYL) 500 MG tablet Take 500 mg by mouth 2 (two) times daily.   NIFEdipine (PROCARDIA XL/NIFEDICAL XL) 60 MG 24 hr tablet TAKE 1 TABLET (60 MG TOTAL) BY MOUTH DAILY. DISCONTINUE VALSARTAN   [DISCONTINUED] losartan (COZAAR) 50 MG tablet Take 1 tablet (50 mg total) by mouth daily.   Studies Reviewed:   EKG Interpretation  Date/Time:  Monday December 04 2023 14:38:35 EDT Ventricular Rate:  84 PR Interval:  118 QRS Duration:  78 QT Interval:  390 QTC Calculation: 460 R Axis:   41  Text Interpretation: Normal sinus rhythm Normal ECG No previous ECGs available Confirmed by Rise Paganini [phone]) on 12/04/2023 4:39:46 PM    VAS US renal artery bilateral 11/22/2023 Right: Normal size right kidney. Normal right Resistive Index.         Normal  cortical thickness of right kidney. 1-59% stenosis of         the right renal artery. RRV flow present. Bead-like         appearance in the with elevated systolic and diastolic         velocities is suggestive of fibromuscular dysplasia in the         renal artery.  Left:  Normal size of left kidney. Normal left Resistive Index.         Normal cortical thickness of the left kidney. 1-59% stenosis         of the left renal artery. LRV flow present. Bead-like         appearance with elevated systolic and diastolic velocities is         suggestive of fibromuscular dysplasia in the renal artery.  Risk Assessment/Calculations:             Physical Exam:   VS:  BP 120/84 (BP Location: Left Arm, Patient Position: Sitting, Cuff Size: Normal)   Pulse 84   Ht 5\' 7"  (1.702 m)   Wt 171 lb (77.6 kg)   BMI 26.78 kg/m    Wt Readings from Last 3 Encounters:  12/04/23 171 lb (77.6 kg)  10/25/23 177 lb 4 oz (80.4 kg)  09/21/23 171 lb 2 oz (77.6 kg)    GEN: Well nourished, well developed in no acute distress NECK: No JVD; No carotid bruits CARDIAC: RRR, no murmurs, rubs, gallops RESPIRATORY:  Clear to auscultation without rales, wheezing or rhonchi  ABDOMEN: Soft, non-tender, non-distended EXTREMITIES:  No edema; No acute deformity     Assessment and Plan:  Renovascular hypertension / Fibromuscular dysplasia S/p PTA with 5 mm balloon for right renal artery FMD and presumed renal vessel hypertension on 05/2022 Renal artery [place] on 11/2022 showed no evidence of RAS Most recent renal artery [place] on 11/2023 showed bilateral 1-59% stenosis of the renal arteries suggestive of fibromuscular dysplasia in renal arteries -Blood pressure average at home 140s-160s with associated headache -Will increase losartan to 100 mg daily and continue Nifedipine 60 mg daily  -Start aspirin EC 81 mg daily -I doubt she will need repeat PTA at this time, however with BP becoming more uncontrolled, I will have her see Dr. Allyson Sabal  who performed her prior procedure  -CMET today  Hyperlipidemia LDL 96 on 12/2022 and above her goal of less than 70 -Start Atorvastatin 40 mg daily -Repeat lipid panel today           Dispo:  Return in about 1 month (around 01/04/2024).  Signed, Denyce Robert, NP

## 2023-12-05 ENCOUNTER — Telehealth: Payer: Self-pay | Admitting: *Deleted

## 2023-12-05 ENCOUNTER — Encounter: Payer: Self-pay | Admitting: Family Medicine

## 2023-12-05 ENCOUNTER — Encounter: Payer: Self-pay | Admitting: Cardiovascular Disease

## 2023-12-05 LAB — COMPREHENSIVE METABOLIC PANEL
ALT: 17 IU/L (ref 0–32)
AST: 15 IU/L (ref 0–40)
Albumin: 4.8 g/dL (ref 3.9–4.9)
Alkaline Phosphatase: 90 IU/L (ref 44–121)
BUN/Creatinine Ratio: 14 (ref 9–23)
BUN: 12 mg/dL (ref 6–24)
Bilirubin Total: 0.4 mg/dL (ref 0.0–1.2)
CO2: 19 mmol/L — ABNORMAL LOW (ref 20–29)
Calcium: 9.4 mg/dL (ref 8.7–10.2)
Chloride: 101 mmol/L (ref 96–106)
Creatinine, Ser: 0.83 mg/dL (ref 0.57–1.00)
Globulin, Total: 2.5 g/dL (ref 1.5–4.5)
Glucose: 93 mg/dL (ref 70–99)
Potassium: 4.5 mmol/L (ref 3.5–5.2)
Sodium: 136 mmol/L (ref 134–144)
Total Protein: 7.3 g/dL (ref 6.0–8.5)
eGFR: 90 mL/min/{1.73_m2} (ref >59)

## 2023-12-05 LAB — LIPID PANEL
Chol/HDL Ratio: 3.2 ratio (ref 0.0–4.4)
Cholesterol, Total: 156 mg/dL (ref 100–199)
HDL: 49 mg/dL (ref >39)
LDL Chol Calc (NIH): 82 mg/dL (ref 0–99)
Triglycerides: 141 mg/dL (ref 0–149)
VLDL Cholesterol Cal: 25 mg/dL (ref 5–40)

## 2023-12-05 NOTE — Telephone Encounter (Signed)
 Patient called me back and I let her know that per Dr. Hazle Coca nurse, Carma Lair, her appointment had to be rescheduled to 1 month. She was very upset when I told her that and said that she felt like the providers were playing with her life and that she was tired of having headaches. She also felt like 1 month would be too long of a wait for what she has been having going on with her. I offered to let her speak to Dr. Allyson Sabal or Rise Paganini so that they could better explain why Dr. Allyson Sabal wanted to see her in 1 month verses next week, but she did not want to talk to them. I ended up rescheduling her for January 10, 2024 at 9:15 AM. I expressed to her that I was sorry that her appointment had to be changed and she told me it was ok and that it was not my fault and she understood that I was just doing what the nurse Carma Lair and Dr. Allyson Sabal asked for me to do.

## 2023-12-12 ENCOUNTER — Ambulatory Visit: Payer: Self-pay | Admitting: Cardiovascular Disease

## 2024-01-10 ENCOUNTER — Ambulatory Visit: Attending: Cardiovascular Disease | Admitting: Cardiovascular Disease

## 2024-01-10 ENCOUNTER — Encounter: Payer: Self-pay | Admitting: Cardiovascular Disease

## 2024-01-10 VITALS — BP 148/98 | HR 74 | Ht 67.0 in | Wt 179.4 lb

## 2024-01-10 DIAGNOSIS — I10 Essential (primary) hypertension: Secondary | ICD-10-CM | POA: Diagnosis not present

## 2024-01-10 DIAGNOSIS — E782 Mixed hyperlipidemia: Secondary | ICD-10-CM | POA: Diagnosis not present

## 2024-01-10 DIAGNOSIS — I150 Renovascular hypertension: Secondary | ICD-10-CM

## 2024-01-10 DIAGNOSIS — I701 Atherosclerosis of renal artery: Secondary | ICD-10-CM

## 2024-01-10 MED ORDER — VALSARTAN-HYDROCHLOROTHIAZIDE 320-12.5 MG PO TABS: 1.0000 | ORAL_TABLET | Freq: Every day | ORAL | 3 refills | Status: DC

## 2024-01-10 NOTE — Assessment & Plan Note (Signed)
 History of hyperlipidemia on statin therapy with her most recent lipid profile performed 12/04/2023 revealing total cholesterol 156, LDL 82 and HDL 49.

## 2024-01-10 NOTE — Progress Notes (Signed)
 01/10/2024 Amanda Strong   22-Oct-1980  [id number]  Primary Physician Cheril Cork Cisco Crest, MD Primary Cardiologist: Avanell Leigh MD Bennye Bravo, [place]  HPI:  Amanda Strong is a 43 y.o.   moderately overweight single African-American female mother of 2 children (1 son and 1 daughter) who worked as an Public house manager at [employer].  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  She was referred by Dr. Eliane Grooms for evaluation of renal vascular hypertension.  I last saw her in the office 05/18/2022.  Hypertension is her only risk factor.  There is no family history for heart disease.  [name] never had heart attack or stroke.  [name] fairly active and is asymptomatic.  She had hypertension since the birth of her last child 14 years ago and it has been resistant on 2 medications recently.  Dr. Cheril Cork was going to add a third medication but decided to get a renal Doppler study 05/12/2022 revealing a right renal aortic ratio 3.9 and left of 2.64.  Her right pole-to-pole dimension was 9.68 cm and left was 12 cm.  The highest velocity appeared to be in the mid right renal artery suggesting the possibility of fibromuscular dysplasia.   I performed renal angiography on her 06/13/2022 revealing FMD in the mid to distal right renal artery just before bifurcation.  I performed PTA with a 5 mm balloon.  She was discharged home the following day.  She has since discontinued amlodipine  and has excellent blood pressure control already on valsartan .  Her follow-up renal Doppler study performed 06/24/2022 revealed normalization of her elevated renal artery velocities on the right.  Her right kidney dimensions grew from 9.7 cm on 05/08/2022 up to 10.5 cm.  Since I saw her back a year and a half ago she has transition from valsartan  to losartan .  Apparently she was considering getting pregnant again but has since abandon that idea.  Her blood  pressures were not responsive to hydralazine  or amlodipine  in the past.  [name] currently not taking her Procardia .  She says her blood pressure is moderately elevated at home.  Her most recent renal Dopplers performed 11/22/2023 do show recurrence of her right renal artery FMD and some distal left renal artery FMD as well although her renal dimensions have remained stable.  I am going to put her back on her loop valsartan  and hydrochlorothiazide .  Hopefully we can avoid doing another intervention.  Otherwise, she is active and denies chest pain or shortness of breath.   Current Meds  Medication Sig   aspirin  EC (ASPIRIN  LOW DOSE) 81 MG tablet Take 1 tablet (81 mg total) by mouth daily.   atorvastatin  (LIPITOR) 40 MG tablet Take 1 tablet (40 mg total) by mouth daily.   clonazePAM  (KLONOPIN ) 0.5 MG tablet Take 1 tablet (0.5 mg total) by mouth daily as needed for anxiety.   FLUoxetine  (PROZAC ) 40 MG capsule Take 1 capsule (40 mg total) by mouth daily.   [DISCONTINUED] losartan  (COZAAR ) 100 MG tablet Take 1 tablet (100 mg total) by mouth daily.     Allergies  Allergen Reactions   Cephalexin     REACTION: intense itching all over   Lamictal  [Lamotrigine ] Hives and Itching   Oxycodone  Itching    Social History   Socioeconomic History   Marital status: Single    Spouse name: Not on file   Number of  children: 1   Years of education: 16   Highest education level: Not on file  Occupational History   Occupation: WOUND CARE NURSES    Employer: [employer]  Tobacco Use   Smoking status: Former    Current packs/day: 0.00    Types: Cigarettes    Quit date: 10/22/2008    Years since quitting: 15.2   Smokeless tobacco: Never  Vaping Use   Vaping status: Never Used  Substance and Sexual Activity   Alcohol use: No    Alcohol/week: 0.0 standard drinks of alcohol    Comment: occasionally   Drug use: No   Sexual activity: Yes    Partners: Male    Birth control/protection:  Condom, I.U.D.  Other Topics Concern   Not on file  Social History Narrative   Caffeine Use-yes   Regular exercise-no      Entered 03/2014:   Single Mom of 2 children   Children are:      Girl--85 months old      Boy--43 years old   She works at [employer].    Currently works with wound care   Social Drivers of Health   Financial Resource Strain: Not on file  Food Insecurity: Not on file  Transportation Needs: Not on file  Physical Activity: Not on file  Stress: Not on file  Social Connections: Not on file  Intimate Partner Violence: Not on file     Review of Systems: General: negative for chills, fever, night sweats or weight changes.  Cardiovascular: negative for chest pain, dyspnea on exertion, edema, orthopnea, palpitations, paroxysmal nocturnal dyspnea or shortness of breath Dermatological: negative for rash Respiratory: negative for cough or wheezing Urologic: negative for hematuria Abdominal: negative for nausea, vomiting, diarrhea, bright red blood per rectum, melena, or hematemesis Neurologic: negative for visual changes, syncope, or dizziness All other systems reviewed and are otherwise negative except as noted above.    Blood pressure (!) 148/98, pulse 74, height 5\' 7"  (1.702 m), weight 179 lb 6.4 oz (81.4 kg), SpO2 98%.  General appearance: alert and no distress Neck: no adenopathy, no carotid bruit, no JVD, supple, symmetrical, trachea midline, and thyroid  not enlarged, symmetric, no tenderness/mass/nodules Lungs: clear to auscultation bilaterally Heart: regular rate and rhythm, S1, S2 normal, no murmur, click, rub or gallop Extremities: extremities normal, atraumatic, no cyanosis or edema Pulses: 2+ and symmetric Skin: Skin color, texture, turgor normal. No rashes or lesions Neurologic: Grossly normal  EKG not performed today      ASSESSMENT AND PLAN:   Essential hypertension History of hypertension thought to be renal vascular.  She has had  hypertension she was since she was 43 years old on multiple antihypertensive medications.  Apparently she was not responsive to amlodipine  or hydralazine .  [name] currently not taking her Procardia .  She does have FMD of at least the right renal artery and I performed PTA on her 06/13/2022 with a 5 mm balloon.  This was in the distal main right renal artery just before the bifurcation.  I did not see left renal artery FMD on angiography.  Over the last 3 to 6 months her blood pressure has been more difficult to control.  She was changed from valsartan  to losartan  however during that time as well.  Her most recent renal Doppler studies performed 11/22/2023 did show recurrence of her right renal artery FMD by velocities and now she has some mild left renal artery FMD.  Her renal dimensions have remained  stable.  I am going to switch her back from losartan  to high dose valsartan  and add hydrochlorothiazide .  Will check a basic metabolic panel in 2 weeks.  Asked her to keep a 30-day blood pressure log and she will see a Pharm.D. after that to review.  Hopefully we can get away with treating her pharmacologically as opposed to having to go back and performed repeat PTA.  Hyperlipidemia History of hyperlipidemia on statin therapy with her most recent lipid profile performed 12/04/2023 revealing total cholesterol 156, LDL 82 and HDL 49.     Avanell Leigh MD FACP,FACC,FAHA, [facility] 01/10/2024 9:48 AM

## 2024-01-10 NOTE — Assessment & Plan Note (Signed)
 History of hypertension thought to be renal vascular.  She has had hypertension she was since she was 44 years old on multiple antihypertensive medications.  Apparently she was not responsive to amlodipine  or hydralazine .  [name] currently not taking her Procardia .  She does have FMD of at least the right renal artery and I performed PTA on her 06/13/2022 with a 5 mm balloon.  This was in the distal main right renal artery just before the bifurcation.  I did not see left renal artery FMD on angiography.  Over the last 3 to 6 months her blood pressure has been more difficult to control.  She was changed from valsartan  to losartan  however during that time as well.  Her most recent renal Doppler studies performed 11/22/2023 did show recurrence of her right renal artery FMD by velocities and now she has some mild left renal artery FMD.  Her renal dimensions have remained stable.  I am going to switch her back from losartan  to high dose valsartan  and add hydrochlorothiazide .  Will check a basic metabolic panel in 2 weeks.  Asked her to keep a 30-day blood pressure log and she will see a Pharm.D. after that to review.  Hopefully we can get away with treating her pharmacologically as opposed to having to go back and performed repeat PTA.

## 2024-01-10 NOTE — Patient Instructions (Signed)
 Medication Instructions:  Your physician has recommended you make the following change in your medication:   -Stop losartan  (cozaar ).  -Start taking valsartan /hydrochlorothiazide  (Diovan -HCT) 320mg /12.5mg  once daily.  *If you need a refill on your cardiac medications before your next appointment, please call your pharmacy*  Lab Work: Your physician recommends that you return for lab work in: 2 weeks for BMET  If you have labs (blood work) drawn today and your tests are completely normal, you will receive your results only by: MyChart Message (if you have MyChart) OR A paper copy in the mail If you have any lab test that is abnormal or we need to change your treatment, we will call you to review the results.  Testing/Procedures: Your physician has requested that you have a renal artery duplex. During this test, an ultrasound is used to evaluate blood flow to the kidneys. Take your medications as you usually do. This will take place at [address] [facility]. 4th Floor  No food after 11PM the night before.  Water is OK. (Don't drink liquids if you have been instructed not to for ANOTHER test). Avoid foods that produce bowel gas, for 24 hours prior to exam (see below). No breakfast, no chewing gum, no smoking or carbonated beverages. Patient may take morning medications with water. Come in for test at least 15 minutes early to register. **To do in March 2026**  Please note: We ask at that you not bring children with you during ultrasound (echo/ vascular) testing. Due to room size and safety concerns, children are not allowed in the ultrasound rooms during exams. Our front office staff cannot provide observation of children in our lobby area while testing is being conducted. An adult accompanying a patient to their appointment will only be allowed in the ultrasound room at the discretion of the ultrasound technician under special circumstances. We apologize for any inconvenience.   Follow-Up: At  [facility], you and your health needs are our priority.  As part of our continuing mission to provide you with exceptional heart care, our providers are all part of one team.  This team includes your primary Cardiologist (physician) and Advanced Practice Providers or APPs (Physician Assistants and Nurse Practitioners) who all work together to provide you with the care you need, when you need it.  Your next appointment:   6 month(s)  Provider:   Callie Goodrich, PA-C, Kathleen Johnson, PA-C, Hao Meng, PA-C, Marlana Silvan, NP, or Katlyn West, NP       Then, Lauro Portal, MD will plan to see you again in 12 month(s).    We recommend signing up for the patient portal called "MyChart".  Sign up information is provided on this After Visit Summary.  MyChart is used to connect with patients for Virtual Visits (Telemedicine).  Patients are able to view lab/test results, encounter notes, upcoming appointments, etc.  Non-urgent messages can be sent to your provider as well.   To learn more about what you can do with MyChart, go to [email or web address].   Other Instructions Dr. Katheryne Pane has requested that you schedule an appointment with one of our clinical pharmacists for a blood pressure check appointment within the next 8 weeks.  If you monitor your blood pressure (BP) at home, please bring your BP cuff and your BP readings with you to this appointment  HOW TO TAKE YOUR BLOOD PRESSURE: Rest 5 minutes before taking your blood pressure. Don't smoke or drink caffeinated beverages for at least 30 minutes before. Take your  blood pressure before (not after) you eat. Sit comfortably with your back supported and both feet on the floor (don't cross your legs). Elevate your arm to heart level on a table or a desk. Use the proper sized cuff. It should fit smoothly and snugly around your bare upper arm. There should be enough room to slip a fingertip under the cuff. The bottom edge of the cuff should  be 1 inch above the crease of the elbow. Ideally, take 3 measurements at one sitting and record the average.       1st Floor: - Lobby - Registration  - Pharmacy  - Lab - Cafe  2nd Floor: - PV Lab - Diagnostic Testing (echo, CT, nuclear med)  3rd Floor: - Vacant  4th Floor: - TCTS (cardiothoracic surgery) - AFib Clinic - Structural Heart Clinic - Vascular Surgery  - Vascular Ultrasound  5th Floor: - HeartCare Cardiology (general and EP) - Clinical Pharmacy for coumadin, hypertension, lipid, weight-loss medications, and med management appointments    Valet parking services will be available as well.

## 2024-02-07 ENCOUNTER — Encounter (HOSPITAL_COMMUNITY): Payer: Self-pay

## 2024-02-09 ENCOUNTER — Other Ambulatory Visit (HOSPITAL_COMMUNITY): Payer: Self-pay

## 2024-02-09 ENCOUNTER — Telehealth (HOSPITAL_COMMUNITY): Payer: Self-pay

## 2024-02-09 MED ORDER — REXULTI 0.5 MG PO TABS: 0.5000 mg | ORAL_TABLET | Freq: Every day | ORAL | 0 refills | Status: DC

## 2024-02-09 NOTE — Telephone Encounter (Signed)
 Patient sent a mychart message - it did not come to me and I am not sure why, I will check with IT. Patients message reads as follows:  I have been struggling with my emotions lately! 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 I'm upset about my daughter's father and being disrespectful. I'm sad I'm confused about my relationship with my ex and a current man that had been my friend for years I would like to try something new I just scared and I feel overstimulated and feel my thoughts are all over the place and wish I could just disappear. I just want peace. My daughter had a rough year this school year still has all over emotions herself. The klonopin  I take at night before bed or after work before bed but when I get anxious I do t have anything to rely on. Tried of feeling overwhelmed and short. Work is good I feel like a bad daughter and feel like there is just not enough time for all things and I'm feeling burnt out. Work is good on the other hand seems only thing right right now help me! Please     Please review and advise, thank you

## 2024-02-09 NOTE — Telephone Encounter (Signed)
 I reviewed her chart.  Patient had not tried Rexulti.  I can try Rexulti 0.5 mg at bedtime and keep all other medication the same.  She has appointment on June 6 and we can evaluate if new medicine working.  However if she need a sooner appointment than please schedule earlier appointment.  If having any suicidal thoughts or homicidal thoughts and mood is very unstable other option is inpatient versus PHP.  If she agree with low-dose Rexulti then please call the pharmacy.

## 2024-02-09 NOTE — Telephone Encounter (Signed)
 I called the patient back and got her voicemail. I advised her that the new medication was going to be sent to the pharmacy and if she is okay with starting it she can pick it up. I left her the number to the office at [facility] where I am today in case she had any questions.

## 2024-02-23 ENCOUNTER — Telehealth (HOSPITAL_COMMUNITY): Payer: Self-pay | Admitting: Psychiatry

## 2024-02-28 ENCOUNTER — Telehealth (HOSPITAL_COMMUNITY): Payer: Self-pay | Admitting: Psychiatry

## 2024-02-29 ENCOUNTER — Ambulatory Visit: Attending: Cardiology | Admitting: Pharmacist

## 2024-02-29 NOTE — Progress Notes (Deleted)
 Patient ID: Amanda Strong                 DOB: 1980/10/18                      MRN: [id number]     HPI: Amanda Strong is a 43 y.o. female referred by Dr. Aaron Aas to HTN clinic. PMH is significant for  Current HTN meds:  Previously tried:  BP goal:   Family History:   Social History:   Diet:   Exercise:   Home BP readings:   Wt Readings from Last 3 Encounters:  01/10/24 179 lb 6.4 oz (81.4 kg)  12/04/23 171 lb (77.6 kg)  12/01/23 177 lb (80.3 kg)   BP Readings from Last 3 Encounters:  01/10/24 (!) 148/98  12/04/23 120/84  10/25/23 128/86   Pulse Readings from Last 3 Encounters:  01/10/24 74  12/04/23 84  10/25/23 81    Renal function: CrCl cannot be calculated ([name] most recent lab result is older than the maximum 21 days allowed.).  Past Medical History:  Diagnosis Date   Abnormal Pap smear    Acne    Allergy    Anxiety    Depression    Group B streptococcal infection in pregnancy 10/28/2012   History of chicken pox    Hypertension    Hyperthyroidism    LGSIL (low grade squamous intraepithelial dysplasia)    Dr. Wayna Hails   Normal pregnancy, repeat 10/28/2012    Current Outpatient Medications on File Prior to Visit  Medication Sig Dispense Refill   albuterol (VENTOLIN HFA) 108 (90 Base) MCG/ACT inhaler Inhale 1 puff into the lungs every 4 (four) hours as needed.     Brexpiprazole  (REXULTI ) 0.5 MG TABS Take 1 tablet (0.5 mg total) by mouth at bedtime. 30 tablet 0   predniSONE (DELTASONE) 20 MG tablet Take 40 mg by mouth daily.     aspirin  EC (ASPIRIN  LOW DOSE) 81 MG tablet Take 1 tablet (81 mg total) by mouth daily. 90 tablet 3   atorvastatin  (LIPITOR) 40 MG tablet Take 1 tablet (40 mg total) by mouth daily. 90 tablet 1   clonazePAM  (KLONOPIN ) 0.5 MG tablet Take 1 tablet (0.5 mg total) by mouth daily as needed for anxiety. 30 tablet 2   FLUoxetine  (PROZAC ) 40 MG capsule Take 1 capsule (40 mg total) by mouth daily. 30 capsule 2    valsartan -hydrochlorothiazide  (DIOVAN -HCT) 320-12.5 MG tablet Take 1 tablet by mouth daily. 90 tablet 3   [DISCONTINUED] desvenlafaxine  (PRISTIQ ) 50 MG 24 hr tablet Take 1 tablet (50 mg total) by mouth daily. 21 tablet 0   No current facility-administered medications on file prior to visit.    Allergies  Allergen Reactions   Cephalexin     REACTION: intense itching all over   Lamictal  [Lamotrigine ] Hives and Itching   Oxycodone  Itching     Assessment/Plan:  1. Hypertension -

## 2024-03-04 ENCOUNTER — Other Ambulatory Visit (HOSPITAL_COMMUNITY): Payer: Self-pay | Admitting: Psychiatry

## 2024-03-04 DIAGNOSIS — F319 Bipolar disorder, unspecified: Secondary | ICD-10-CM

## 2024-03-25 ENCOUNTER — Encounter (HOSPITAL_COMMUNITY): Payer: Self-pay | Admitting: Psychiatry

## 2024-03-25 ENCOUNTER — Telehealth (HOSPITAL_BASED_OUTPATIENT_CLINIC_OR_DEPARTMENT_OTHER): Payer: Self-pay | Admitting: Psychiatry

## 2024-03-25 VITALS — Wt 178.0 lb

## 2024-03-25 DIAGNOSIS — F410 Panic disorder [episodic paroxysmal anxiety] without agoraphobia: Secondary | ICD-10-CM | POA: Diagnosis not present

## 2024-03-25 DIAGNOSIS — F319 Bipolar disorder, unspecified: Secondary | ICD-10-CM

## 2024-03-25 MED ORDER — BREXPIPRAZOLE 1 MG PO TABS: 1.0000 mg | ORAL_TABLET | Freq: Every day | ORAL | 1 refills | Status: DC

## 2024-03-25 MED ORDER — CLONAZEPAM 0.5 MG PO TABS
0.5000 mg | ORAL_TABLET | Freq: Every day | ORAL | 1 refills | Status: DC | PRN
Start: 2024-03-25 — End: 2024-06-13

## 2024-03-25 MED ORDER — FLUOXETINE HCL 40 MG PO CAPS: 40.0000 mg | ORAL_CAPSULE | Freq: Every day | ORAL | 1 refills | Status: DC
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
## 2024-04-17 ENCOUNTER — Encounter (HOSPITAL_COMMUNITY): Payer: Self-pay

## 2024-04-17 NOTE — Telephone Encounter (Signed)
 Rexulti  has lower incidence of weight gain however she can try reducing to half tab (0.5) if that helps.

## 2024-04-18 ENCOUNTER — Encounter: Payer: Self-pay | Admitting: Pharmacist

## 2024-04-18 ENCOUNTER — Ambulatory Visit: Attending: Cardiology | Admitting: Pharmacist

## 2024-04-18 VITALS — BP 168/131 | HR 81

## 2024-04-18 DIAGNOSIS — I150 Renovascular hypertension: Secondary | ICD-10-CM

## 2024-04-18 NOTE — Assessment & Plan Note (Signed)
 Assessment: BP is uncontrolled in office BP 152/120 mmHg heart rate 80 (goal <130/80)- pt is emotionally distressed due to personal matter, home BP stays at goal <130/80  even at PCP visit it was at goal - does not recall exact numbers- will be calling today to report home readings ( forgot log at home)   Tolerates Diovan  HCTZ 320/12.5 mg daily well without side effects  Denies SOB, palpitation, chest pain, headaches,or swelling Will get BMP checked today and optimize therapy further by adding CCB and increasing thiazide diuretic dose to 25 mg daily  Reiterated the importance of regular exercise and low salt diet   Plan:  Get BMP checked today, to grant pt's wish we will make medication changes after reviewing lab result tomorrow  Will add amlodipine  5 mg daily and if BMP remains WNL will up the thiazide dose from 12.5 mg daily to 25 mg daily  Pt to bring home BP monitor for validation at next OV  Patient to keep record of BP readings with heart rate and report to us  at the next visit Patient to see PharmD in 2 weeks for follow up  Follow up lab(s): BMP

## 2024-04-18 NOTE — Progress Notes (Signed)
 Patient ID: Amanda Strong                 DOB: 08/14/1981                      MRN: [id number]      HPI: Amanda Strong is a 43 y.o. female referred by Dr. Court to HTN clinic. PMH is significant for renovascular hypertension, hypothyroidism, GAD, HLD.    Hx of renovascular hypertension. She has had hypertension she was since she was 43 years old on multiple antihypertensive medications. 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 Pt was put on valsartan  but over last 3 years valsartan  was switched to losartan . 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 Dr. Court had switch losartan  to valsartan  with addition of hydrochlorothiazide  on 12/21/2023 BMP was ordered  and patient was referred to hypertension clinic. 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 She reported ongoing sleep difficulties, which she feels may have contributed to an elevated blood pressure reading during today's visit. At home, her blood pressure has remained stable in the 128-130/80 mmHg range since Dr. Court added a diuretic and switched her ARB. She currently walks one mile twice a week and plans to begin attending the gym 3-4 times per week with a friend starting next week. Her diet consists mostly of home-cooked meals, with dining out limited to twice a week. She admits to occasionally enjoying salty snacks. Overall, she remains motivated to maintain healthy lifestyle habits and manage her blood pressure, despite recent emotional and sleep-related challenges.  Current HTN meds: Diovan  HCTZ 320/12.5 mg daily  Previously tried: losartan - insufficient response  BP goal: <130/80   Family History:   Social History:  Alcohol: 2 std drinks once a  week  Smoking: none   Diet: follows low salt diet and only uses salt free seasoning mix to add flavour to home cooked meals. Eats out 2 times per week. Occasional consumption of salty snacks    Exercise: planing to go to gym - 3 t o 4 days per week starting from next week  1 mile walks 2 days a week     Home BP readings: 120-130/80 -recalls from memory forgot to bring log    Wt Readings from Last 3 Encounters:  03/25/24 178 lb (80.7 kg)  01/10/24 179 lb 6.4 oz (81.4 kg)  12/04/23 171 lb (77.6 kg)   BP Readings from Last 3 Encounters:  04/18/24 (!) 168/131  01/10/24 (!) 148/98  12/04/23 120/84   Pulse Readings from Last 3 Encounters:  04/18/24 81  01/10/24 74  12/04/23 84    Renal function: CrCl cannot be calculated ([name] most recent lab result is older than the maximum 21 days allowed.).  Past Medical History:  Diagnosis Date   Abnormal Pap smear    Acne    Allergy    Anxiety    Depression    Group B streptococcal infection in pregnancy 10/28/2012   History of chicken pox    Hypertension    Hyperthyroidism    LGSIL (low grade squamous intraepithelial dysplasia)    Dr. Estelle   Normal pregnancy, repeat 10/28/2012    Current Outpatient  Medications on File Prior to Visit  Medication Sig Dispense Refill   albuterol (VENTOLIN HFA) 108 (90 Base) MCG/ACT inhaler Inhale 1 puff into the lungs every 4 (four) hours as needed.     aspirin  EC (ASPIRIN  LOW DOSE) 81 MG tablet Take 1 tablet (81 mg total) by mouth daily. 90 tablet 3   atorvastatin  (LIPITOR) 40 MG tablet Take 1 tablet (40 mg total) by mouth daily. 90 tablet 1   brexpiprazole  (REXULTI ) 1 MG TABS tablet Take 1 tablet (1 mg total) by mouth daily. 30 tablet 1   clonazePAM  (KLONOPIN ) 0.5 MG tablet Take 1 tablet (0.5 mg total) by mouth daily as needed for anxiety. 30 tablet 1   FLUoxetine  (PROZAC ) 40 MG capsule Take 1 capsule (40 mg total) by mouth daily. 30 capsule 1   predniSONE (DELTASONE) 20 MG tablet Take 40  mg by mouth daily.     valsartan -hydrochlorothiazide  (DIOVAN -HCT) 320-12.5 MG tablet Take 1 tablet by mouth daily. 90 tablet 3   [DISCONTINUED] desvenlafaxine  (PRISTIQ ) 50 MG 24 hr tablet Take 1 tablet (50 mg total) by mouth daily. 21 tablet 0   No current facility-administered medications on file prior to visit.    Allergies  Allergen Reactions   Cephalexin     REACTION: intense itching all over   Lamictal  [Lamotrigine ] Hives and Itching   Oxycodone  Itching    Blood pressure (!) 168/131, pulse 81, SpO2 98%.   Assessment/Plan:  1. Hypertension -  Renovascular hypertension Assessment: BP is uncontrolled in office BP 152/120 mmHg heart rate 80 (goal <130/80)- pt is emotionally distressed due to personal matter, home BP stays at goal <130/80  even at PCP visit it was at goal - does not recall exact numbers- will be calling today to report home readings ( forgot log at home)   Tolerates Diovan  HCTZ 320/12.5 mg daily well without side effects  Denies SOB, palpitation, chest pain, headaches,or swelling Will get BMP checked today and optimize therapy further by adding CCB and increasing thiazide diuretic dose to 25 mg daily  Reiterated the importance of regular exercise and low salt diet   Plan:  Get BMP checked today, to grant pt's wish we will make medication changes after reviewing lab result tomorrow  Will add amlodipine  5 mg daily and if BMP remains WNL will up the thiazide dose from 12.5 mg daily to 25 mg daily  Pt to bring home BP monitor for validation at next OV  Patient to keep record of BP readings with heart rate and report to us  at the next visit Patient to see PharmD in 2 weeks for follow up  Follow up lab(s): BMP    Thank you  Robbi Blanch, Pharm.D [place] [facility]. [facility] [address], [place], [place] [address] Phone: [phone]; Fax: [phone]

## 2024-04-18 NOTE — Patient Instructions (Signed)
 No Changes made to your medications by your pharmacist Robbi Blanch, PharmD at today's visit:   BMP due today   Bring all of your meds, your BP cuff and your record of home blood pressures to your next appointment.    HOW TO TAKE YOUR BLOOD PRESSURE AT HOME  Rest 5 minutes before taking your blood pressure.  Don't smoke or drink caffeinated beverages for at least 30 minutes before. Take your blood pressure before (not after) you eat. Sit comfortably with your back supported and both feet on the floor (don't cross your legs). Elevate your arm to heart level on a table or a desk. Use the proper sized cuff. It should fit smoothly and snugly around your bare upper arm. There should be enough room to slip a fingertip under the cuff. The bottom edge of the cuff should be 1 inch above the crease of the elbow. Ideally, take 3 measurements at one sitting and record the average.  Important lifestyle changes to control high blood pressure  Intervention  Effect on the BP  Lose extra pounds and watch your waistline Weight loss is one of the most effective lifestyle changes for controlling blood pressure. If you're overweight or obese, losing even a small amount of weight can help reduce blood pressure. Blood pressure might go down by about 1 millimeter of mercury (mm Hg) with each kilogram (about 2.2 pounds) of weight lost.  Exercise regularly As a general goal, aim for at least 30 minutes of moderate physical activity every day. Regular physical activity can lower high blood pressure by about 5 to 8 mm Hg.  Eat a healthy diet Eating a diet rich in whole grains, fruits, vegetables, and low-fat dairy products and low in saturated fat and cholesterol. A healthy diet can lower high blood pressure by up to 11 mm Hg.  Reduce salt (sodium) in your diet Even a small reduction of sodium in the diet can improve heart health and reduce high blood pressure by about 5 to 6 mm Hg.  Limit alcohol One drink equals  12 ounces of beer, 5 ounces of wine, or 1.5 ounces of 80-proof liquor.  Limiting alcohol to less than one drink a day for women or two drinks a day for men can help lower blood pressure by about 4 mm Hg.   If you have any questions or concerns please use My Chart to send questions or call the office at [phone]

## 2024-04-19 ENCOUNTER — Ambulatory Visit: Payer: Self-pay | Admitting: Cardiovascular Disease

## 2024-04-19 LAB — BASIC METABOLIC PANEL WITH GFR
BUN/Creatinine Ratio: 17 (ref 9–23)
BUN: 12 mg/dL (ref 6–24)
CO2: 20 mmol/L (ref 20–29)
Calcium: 9.4 mg/dL (ref 8.7–10.2)
Chloride: 103 mmol/L (ref 96–106)
Creatinine, Ser: 0.71 mg/dL (ref 0.57–1.00)
Glucose: 96 mg/dL (ref 70–99)
Potassium: 4.2 mmol/L (ref 3.5–5.2)
Sodium: 141 mmol/L (ref 134–144)
eGFR: 109 mL/min/1.73 (ref >59)

## 2024-04-24 ENCOUNTER — Other Ambulatory Visit (HOSPITAL_COMMUNITY): Payer: Self-pay | Admitting: Psychiatry

## 2024-04-24 DIAGNOSIS — F319 Bipolar disorder, unspecified: Secondary | ICD-10-CM

## 2024-04-28 NOTE — Progress Notes (Deleted)
 Patient ID: Amanda Strong                 DOB: [birth year]                      MRN: [id number]      HPI: Amanda Strong is a 43 y.o. female referred by Dr. Court to HTN clinic. PMH is significant for renovascular hypertension, hypothyroidism, GAD, HLD.    Hx of renovascular hypertension. She has had hypertension she was since she was 43 years old on multiple antihypertensive medications. 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 Pt was put on valsartan  but over last 3 years valsartan  was switched to losartan . 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 Dr. Court had switch losartan  to valsartan  with addition of hydrochlorothiazide  on 12/21/2023 BMP was ordered  and patient was referred to hypertension clinic. 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 She reported ongoing sleep difficulties, which she feels may have contributed to an elevated blood pressure reading during today's visit. At home, her blood pressure has remained stable in the 128-130/80 mmHg range since Dr. Court added a diuretic and switched her ARB. She currently walks one mile twice a week and plans to begin attending the gym 3-4 times per week with a friend starting next week. Her diet consists mostly of home-cooked meals, with dining out limited to twice a week. She admits to occasionally enjoying salty snacks. Overall, she remains motivated to maintain healthy lifestyle habits and manage her blood pressure, despite recent emotional and sleep-related challenges.  Current HTN meds: Diovan  HCTZ 320/12.5 mg daily  Previously tried: losartan - insufficient response  BP goal: <130/80   Family History:   Social History:  Alcohol: 2 std drinks once a  week  Smoking: none   Diet: follows low salt diet and only uses salt free seasoning mix to add flavour to home cooked meals. Eats out 2 times per week. Occasional consumption of salty snacks    Exercise: planing to go to gym - 3 t o 4 days per week starting from next week  1 mile walks 2 days a week     Home BP readings: 120-130/80 -recalls from memory forgot to bring log    Wt Readings from Last 3 Encounters:  03/25/24 178 lb (80.7 kg)  01/10/24 179 lb 6.4 oz (81.4 kg)  12/04/23 171 lb (77.6 kg)   BP Readings from Last 3 Encounters:  04/18/24 (!) 168/131  01/10/24 (!) 148/98  12/04/23 120/84   Pulse Readings from Last 3 Encounters:  04/18/24 81  01/10/24 74  12/04/23 84    Renal function: CrCl cannot be calculated (Unknown ideal weight.).  Past Medical History:  Diagnosis Date   Abnormal Pap smear    Acne    Allergy    Anxiety    Depression    Group B streptococcal infection in pregnancy 10/28/2012   History of chicken pox    Hypertension    Hyperthyroidism    LGSIL (low grade squamous intraepithelial dysplasia)    Dr. Estelle   Normal pregnancy, repeat 10/28/2012    Current Outpatient Medications on File Prior to Visit  Medication Sig Dispense  Refill   albuterol (VENTOLIN HFA) 108 (90 Base) MCG/ACT inhaler Inhale 1 puff into the lungs every 4 (four) hours as needed.     aspirin  EC (ASPIRIN  LOW DOSE) 81 MG tablet Take 1 tablet (81 mg total) by mouth daily. 90 tablet 3   atorvastatin  (LIPITOR) 40 MG tablet Take 1 tablet (40 mg total) by mouth daily. 90 tablet 1   brexpiprazole  (REXULTI ) 1 MG TABS tablet Take 1 tablet (1 mg total) by mouth daily. 30 tablet 1   clonazePAM  (KLONOPIN ) 0.5 MG tablet Take 1 tablet (0.5 mg total) by mouth daily as needed for anxiety. 30 tablet 1   FLUoxetine  (PROZAC ) 40 MG capsule Take 1 capsule (40 mg total) by mouth daily. 30 capsule 1   predniSONE (DELTASONE) 20 MG tablet Take 40 mg by mouth daily.     valsartan -hydrochlorothiazide   (DIOVAN -HCT) 320-12.5 MG tablet Take 1 tablet by mouth daily. 90 tablet 3   [DISCONTINUED] desvenlafaxine  (PRISTIQ ) 50 MG 24 hr tablet Take 1 tablet (50 mg total) by mouth daily. 21 tablet 0   No current facility-administered medications on file prior to visit.    Allergies  Allergen Reactions   Cephalexin     REACTION: intense itching all over   Lamictal  [Lamotrigine ] Hives and Itching   Oxycodone  Itching    There were no vitals taken for this visit.   Assessment/Plan:  1. Hypertension -  No problem-specific Assessment & Plan notes found for this encounter.   Thank you  Robbi Blanch, Pharm.D [place] [facility]. [facility] [address], [place], [place] [address] Phone: [phone]; Fax: [phone]

## 2024-04-29 ENCOUNTER — Ambulatory Visit: Attending: Internal Medicine | Admitting: Pharmacist

## 2024-05-06 ENCOUNTER — Telehealth: Admitting: Physician Assistant

## 2024-05-06 DIAGNOSIS — R051 Acute cough: Secondary | ICD-10-CM

## 2024-05-06 DIAGNOSIS — J019 Acute sinusitis, unspecified: Secondary | ICD-10-CM | POA: Diagnosis not present

## 2024-05-06 DIAGNOSIS — B9789 Other viral agents as the cause of diseases classified elsewhere: Secondary | ICD-10-CM | POA: Diagnosis not present

## 2024-05-06 MED ORDER — FLUTICASONE PROPIONATE 50 MCG/ACT NA SUSP
2.0000 | Freq: Every day | NASAL | 0 refills | Status: DC
Start: 2024-05-06 — End: 2024-06-04

## 2024-05-06 MED ORDER — PSEUDOEPH-BROMPHEN-DM 30-2-10 MG/5ML PO SYRP: 5.0000 mL | ORAL_SOLUTION | Freq: Four times a day (QID) | ORAL | 0 refills | Status: DC | PRN

## 2024-05-06 NOTE — Progress Notes (Signed)
 E-Visit for Sinus Problems  We are sorry that you are not feeling well.  Here is how we plan to help!  Based on what you have shared with me it looks like you have sinusitis.  Sinusitis is inflammation and infection in the sinus cavities of the head.  Based on your presentation I believe you most likely have Acute Viral Sinusitis.This is an infection most likely caused by a virus. There is not specific treatment for viral sinusitis other than to help you with the symptoms until the infection runs its course.  You may use an oral decongestant such as Mucinex D or if you have glaucoma or high blood pressure use plain Mucinex. Saline nasal spray help and can safely be used as often as needed for congestion, I have prescribed: Fluticasone  nasal spray two sprays in each nostril once a day. I have prescribed Bromfed DM cough syrup for the cough and congestion.   Some authorities believe that zinc sprays or the use of Echinacea may shorten the course of your symptoms.  Sinus infections are not as easily transmitted as other respiratory infection, however we still recommend that you avoid close contact with loved ones, especially the very young and elderly.  Remember to wash your hands thoroughly throughout the day as this is the number one way to prevent the spread of infection!  Home Care: Only take medications as instructed by your medical team. Do not take these medications with alcohol. A steam or ultrasonic humidifier can help congestion.  You can place a towel over your head and breathe in the steam from hot water coming from a faucet. Avoid close contacts especially the very young and the elderly. Cover your mouth when you cough or sneeze. Always remember to wash your hands.  Get Help Right Away If: You develop worsening fever or sinus pain. You develop a severe head ache or visual changes. Your symptoms persist after you have completed your treatment plan.  Make sure you Understand these  instructions. Will watch your condition. Will get help right away if you are not doing well or get worse.   Thank you for choosing an e-visit.  Your e-visit answers were reviewed by a board certified advanced clinical practitioner to complete your personal care plan. Depending upon the condition, your plan could have included both over the counter or prescription medications.  Please review your pharmacy choice. Make sure the pharmacy is open so you can pick up prescription now. If there is a problem, you may contact your provider through [employer] and have the prescription routed to another pharmacy.  Your safety is important to us . If you have drug allergies check your prescription carefully.   For the next 24 hours you can use MyChart to ask questions about today's visit, request a non-urgent call back, or ask for a work or school excuse. You will get an email in the next two days asking about your experience. I hope that your e-visit has been valuable and will speed your recovery.     I have spent 5 minutes in review of e-visit questionnaire, review and updating patient chart, medical decision making and response to patient.   Delon CHRISTELLA Dickinson, PA-C

## 2024-05-18 ENCOUNTER — Telehealth: Admitting: Physician Assistant

## 2024-05-18 DIAGNOSIS — B9689 Other specified bacterial agents as the cause of diseases classified elsewhere: Secondary | ICD-10-CM | POA: Diagnosis not present

## 2024-05-18 DIAGNOSIS — J019 Acute sinusitis, unspecified: Secondary | ICD-10-CM

## 2024-05-18 DIAGNOSIS — R062 Wheezing: Secondary | ICD-10-CM

## 2024-05-18 MED ORDER — AMOXICILLIN-POT CLAVULANATE 875-125 MG PO TABS: 1.0000 | ORAL_TABLET | Freq: Two times a day (BID) | ORAL | 0 refills | Status: DC

## 2024-05-18 MED ORDER — ALBUTEROL SULFATE HFA 108 (90 BASE) MCG/ACT IN AERS: 2.0000 | INHALATION_SPRAY | Freq: Four times a day (QID) | RESPIRATORY_TRACT | 0 refills | Status: AC | PRN

## 2024-05-18 NOTE — Progress Notes (Signed)
 I have spent 5 minutes in review of e-visit questionnaire, review and updating patient chart, medical decision making and response to patient.   Elsie Velma Lunger, PA-C

## 2024-05-18 NOTE — Progress Notes (Signed)
 Message sent to patient requesting further input regarding current symptoms. Awaiting patient response.

## 2024-05-18 NOTE — Progress Notes (Signed)
 E-Visit for Sinus Problems  We are sorry that you are not feeling well.  Here is how we plan to help!  Based on what you have shared with me it looks like you have sinusitis.  Sinusitis is inflammation and infection in the sinus cavities of the head.  Based on your presentation I believe you most likely have Acute Bacterial Sinusitis.  This is an infection caused by bacteria and is treated with antibiotics. I have prescribed Augmentin  875mg /125mg  one tablet twice daily with food, for 7 days. I have also sent in a refill of your albuterol  inhaler to start as directed.  You may use an oral decongestant such as Mucinex D or if you have glaucoma or high blood pressure use plain Mucinex. Saline nasal spray help and can safely be used as often as needed for congestion.  If you develop worsening sinus pain, fever or notice severe headache and vision changes, or if symptoms are not better after completion of antibiotic, please schedule an appointment with a health care provider.    Sinus infections are not as easily transmitted as other respiratory infection, however we still recommend that you avoid close contact with loved ones, especially the very young and elderly.  Remember to wash your hands thoroughly throughout the day as this is the number one way to prevent the spread of infection!  Home Care: Only take medications as instructed by your medical team. Complete the entire course of an antibiotic. Do not take these medications with alcohol. A steam or ultrasonic humidifier can help congestion.  You can place a towel over your head and breathe in the steam from hot water coming from a faucet. Avoid close contacts especially the very young and the elderly. Cover your mouth when you cough or sneeze. Always remember to wash your hands.  Get Help Right Away If: You develop worsening fever or sinus pain. You develop a severe head ache or visual changes. Your symptoms persist after you have completed  your treatment plan.  Make sure you Understand these instructions. Will watch your condition. Will get help right away if you are not doing well or get worse.  Thank you for choosing an e-visit.  Your e-visit answers were reviewed by a board certified advanced clinical practitioner to complete your personal care plan. Depending upon the condition, your plan could have included both over the counter or prescription medications.  Please review your pharmacy choice. Make sure the pharmacy is open so you can pick up prescription now. If there is a problem, you may contact your provider through [employer] and have the prescription routed to another pharmacy.  Your safety is important to us . If you have drug allergies check your prescription carefully.   For the next 24 hours you can use MyChart to ask questions about today's visit, request a non-urgent call back, or ask for a work or school excuse. You will get an email in the next two days asking about your experience. I hope that your e-visit has been valuable and will speed your recovery.

## 2024-05-27 ENCOUNTER — Telehealth (HOSPITAL_BASED_OUTPATIENT_CLINIC_OR_DEPARTMENT_OTHER): Payer: Self-pay | Admitting: Psychiatry

## 2024-05-27 ENCOUNTER — Encounter (HOSPITAL_COMMUNITY): Payer: Self-pay

## 2024-05-27 DIAGNOSIS — Z91199 Patient's noncompliance with other medical treatment and regimen due to unspecified reason: Secondary | ICD-10-CM

## 2024-05-27 NOTE — Progress Notes (Signed)
 Patient is no-show today.  I called and left a message on her voicemail to schedule appointment.

## 2024-05-28 ENCOUNTER — Encounter (HOSPITAL_COMMUNITY): Payer: Self-pay

## 2024-06-04 ENCOUNTER — Telehealth: Admitting: Physician Assistant

## 2024-06-04 DIAGNOSIS — N760 Acute vaginitis: Secondary | ICD-10-CM | POA: Diagnosis not present

## 2024-06-04 DIAGNOSIS — B9689 Other specified bacterial agents as the cause of diseases classified elsewhere: Secondary | ICD-10-CM | POA: Diagnosis not present

## 2024-06-04 MED ORDER — METRONIDAZOLE 500 MG PO TABS: 500.0000 mg | ORAL_TABLET | Freq: Two times a day (BID) | ORAL | 0 refills | Status: AC

## 2024-06-04 NOTE — Progress Notes (Signed)
 I have spent 5 minutes in review of e-visit questionnaire, review and updating patient chart, medical decision making and response to patient.   Elsie Velma Lunger, PA-C

## 2024-06-04 NOTE — Progress Notes (Signed)

## 2024-06-05 MED ORDER — METRONIDAZOLE 0.75 % VA GEL: 1.0000 | Freq: Every day | VAGINAL | 0 refills | Status: AC

## 2024-06-05 NOTE — Addendum Note (Signed)
 Addended by: VIVIENNE DELON HERO on: 06/05/2024 07:23 AM   Modules accepted: Orders

## 2024-06-07 ENCOUNTER — Telehealth: Admitting: Physician Assistant

## 2024-06-07 ENCOUNTER — Telehealth

## 2024-06-07 DIAGNOSIS — R21 Rash and other nonspecific skin eruption: Secondary | ICD-10-CM

## 2024-06-07 NOTE — Progress Notes (Signed)
  Because of a possible medication reaction, I feel your condition warrants further evaluation and I recommend that you be seen in a face-to-face visit.   NOTE: There will be NO CHARGE for this E-Visit   If you are having a true medical emergency, please call 911.     For an urgent face to face visit, [facility] has multiple urgent care centers for your convenience.  Click the link below for the full list of locations and hours, walk-in wait times, appointment scheduling options and driving directions:  Urgent Care - [place], [place], [place], [place], [place], [place]  [place]     Your MyChart E-visit questionnaire answers were reviewed by a board certified advanced clinical practitioner to complete your personal care plan based on your specific symptoms.    Thank you for using e-Visits.

## 2024-06-09 ENCOUNTER — Other Ambulatory Visit (HOSPITAL_COMMUNITY): Payer: Self-pay | Admitting: Psychiatry

## 2024-06-09 DIAGNOSIS — F319 Bipolar disorder, unspecified: Secondary | ICD-10-CM

## 2024-06-13 ENCOUNTER — Telehealth (HOSPITAL_COMMUNITY): Admitting: Psychiatry

## 2024-06-13 ENCOUNTER — Encounter (HOSPITAL_COMMUNITY): Payer: Self-pay | Admitting: Psychiatry

## 2024-06-13 VITALS — Wt 184.0 lb

## 2024-06-13 DIAGNOSIS — F410 Panic disorder [episodic paroxysmal anxiety] without agoraphobia: Secondary | ICD-10-CM

## 2024-06-13 DIAGNOSIS — F319 Bipolar disorder, unspecified: Secondary | ICD-10-CM

## 2024-06-13 MED ORDER — CLONAZEPAM 0.5 MG PO TABS: 0.5000 mg | ORAL_TABLET | Freq: Every day | ORAL | 1 refills | Status: DC | PRN

## 2024-06-13 MED ORDER — FLUOXETINE HCL 40 MG PO CAPS: 40.0000 mg | ORAL_CAPSULE | Freq: Every day | ORAL | 2 refills | Status: DC
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
## 2024-06-21 ENCOUNTER — Ambulatory Visit: Attending: Pharmacist | Admitting: Pharmacist

## 2024-06-27 ENCOUNTER — Encounter (HOSPITAL_COMMUNITY): Payer: Self-pay

## 2024-06-27 NOTE — Telephone Encounter (Signed)
 [name] on a higher dose of Prozac  which is 40 mg.  I cannot increase the dose further.  I really think she should try Rexulti  which we recommended but she is afraid to take due to side effects of weight gain.  She has not lost weight but worth trying Rexulti  0.5 mg to help her mood symptoms.  If she agree please call or provide samples of Rexulti  0.5 mg.  Thank you

## 2024-08-07 ENCOUNTER — Encounter: Payer: Self-pay | Admitting: Family Medicine

## 2024-09-05 ENCOUNTER — Telehealth (HOSPITAL_COMMUNITY): Admitting: Psychiatry

## 2024-09-09 ENCOUNTER — Encounter (HOSPITAL_COMMUNITY): Payer: Self-pay

## 2024-09-09 ENCOUNTER — Other Ambulatory Visit (HOSPITAL_COMMUNITY): Payer: Self-pay | Admitting: *Deleted

## 2024-09-09 MED ORDER — HYDROXYZINE HCL 25 MG PO TABS: 25.0000 mg | ORAL_TABLET | Freq: Three times a day (TID) | ORAL | 0 refills | Status: DC | PRN

## 2024-09-25 ENCOUNTER — Encounter (HOSPITAL_COMMUNITY): Payer: Self-pay | Admitting: Psychiatry

## 2024-09-25 ENCOUNTER — Telehealth (HOSPITAL_BASED_OUTPATIENT_CLINIC_OR_DEPARTMENT_OTHER): Admitting: Psychiatry

## 2024-09-25 VITALS — Wt 196.0 lb

## 2024-09-25 DIAGNOSIS — F319 Bipolar disorder, unspecified: Secondary | ICD-10-CM

## 2024-09-25 DIAGNOSIS — F410 Panic disorder [episodic paroxysmal anxiety] without agoraphobia: Secondary | ICD-10-CM

## 2024-09-25 DIAGNOSIS — F419 Anxiety disorder, unspecified: Secondary | ICD-10-CM

## 2024-09-25 MED ORDER — REXULTI 0.5 MG PO TABS: 0.5000 mg | ORAL_TABLET | Freq: Every day | ORAL | 0 refills | Status: AC

## 2024-09-25 MED ORDER — FLUOXETINE HCL 40 MG PO CAPS: 40.0000 mg | ORAL_CAPSULE | Freq: Every day | ORAL | 0 refills | Status: AC

## 2024-09-25 MED ORDER — ALPRAZOLAM 0.5 MG PO TABS: 0.5000 mg | ORAL_TABLET | Freq: Every day | ORAL | 0 refills | Status: AC | PRN

## 2024-09-25 NOTE — Progress Notes (Signed)
 " [facility] MD Virtual Progress Note   Patient Location: Work Provider Location: Home Office   I connect with patient by telephone and verified that I am speaking with correct person by using two identifiers. I discussed the limitations of evaluation and management by telemedicine and the availability of in person appointments. I also discussed with the patient that there may be a patient responsible charge related to this service. The patient expressed understanding and agreed to proceed.  Amanda Strong [id number] 44 y.o.  09/25/2024 11:23 AM  History of Present Illness:  Patient is evaluated by video session.  [name] at work.  She reported lately not doing very well and started to get a lot of anxiety, crying, hopelessness and some time negative thoughts.  She reported a lot of responsibilities.  Recently 70 year old father was admitted before Christmas with a diagnosis of septicemia.  Patient told it was the first Christmas when she was separated from her husband.  Patient told father is back home but had a multiple drains and she is helping her father along with other responsibilities that she is doing.  [name] taking him to the doctor's appointment.  Patient told Christmas was difficult because she was thinking about her father.  Patient believe slowly and gradually further doing better.  She called our office to ask something to help the anxiety.  She was given hydroxyzine  but it made her very sleepy.  She wanted to go back to Xanax  but it was not given as patient already taking Klonopin  as her shift is 8 AM to 5 PM and does not need the Klonopin .  She also tried Klonopin  to calm herself but that did not work.  She like to go back to Xanax .  I also recommend to try Rexulti  but patient has not been taking regularly.  She only taking Prozac  40 mg.  Patient is not sure why she is so emotional.  She has appointment to see the OB/GYN for physical and blood work.  [name] not sure if  she has symptoms of perimenopausal.  She reported easily tearful, crying, racing thoughts, anhedonia, lack of energy and not sleeping well.  She denies any hallucination paranoia or any active suicidal thoughts but sometimes she feel hopeless and worthless.  She has no tremor or shakes or any EPS.  [name] open to try Rexulti  now on a regular basis as it does not make her sleepy or groggy.  [name] afraid it may cause weight gain but she continue to gain weight and now believe may be hormonal.  Admitted irritability frustration.  Denies drinking or using any illegal substances.  Past Psychiatric History: H/O mood swing, anger, depression, impulsive behavior, throwing things, damaging and punching walls, speeding tickets and THC use. H/O passive suicidal thoughts, emotional and verbal abuse. No h/o mania, psychosis, inpatient or suicidal attempt. Saw Hoy Lee and tried Klonopin , Cymbalta, Zoloft , Lexapro , Prestiq, hydroxyzine , prozac  and Seroquel  (Too sleepy). Lamictal  caused rash, paxila and abilify  did not work after some time.   Lithium  could not tolerate.  Trintellix  work but could not afford.   Past Medical History:  Diagnosis Date   Abnormal Pap smear    Acne    Allergy    Anxiety    Depression    Group B streptococcal infection in pregnancy 10/28/2012   History of chicken pox    Hypertension    Hyperthyroidism    LGSIL (low grade squamous intraepithelial dysplasia)    Dr.  Richardson   Normal pregnancy, repeat 10/28/2012    Outpatient Encounter Medications as of 09/25/2024  Medication Sig   albuterol  (VENTOLIN  HFA) 108 (90 Base) MCG/ACT inhaler Inhale 2 puffs into the lungs every 6 (six) hours as needed for wheezing or shortness of breath.   aspirin  EC (ASPIRIN  LOW DOSE) 81 MG tablet Take 1 tablet (81 mg total) by mouth daily.   atorvastatin  (LIPITOR) 40 MG tablet Take 1 tablet (40 mg total) by mouth daily.   clonazePAM  (KLONOPIN ) 0.5 MG tablet Take 1 tablet (0.5 mg total) by mouth daily  as needed for anxiety.   FLUoxetine  (PROZAC ) 40 MG capsule Take 1 capsule (40 mg total) by mouth daily.   hydrOXYzine  (ATARAX ) 25 MG tablet Take 1 tablet (25 mg total) by mouth 3 (three) times daily as needed for anxiety.   metroNIDAZOLE  (FLAGYL ) 500 MG tablet Take 1 tablet (500 mg total) by mouth 2 (two) times daily.   valsartan -hydrochlorothiazide  (DIOVAN -HCT) 320-12.5 MG tablet Take 1 tablet by mouth daily.   [DISCONTINUED] desvenlafaxine  (PRISTIQ ) 50 MG 24 hr tablet Take 1 tablet (50 mg total) by mouth daily.   No facility-administered encounter medications on file as of 09/25/2024.    No results found for this or any previous visit (from the past 2160 hours).    Psychiatric Specialty Exam: Physical Exam  Review of Systems  Weight 196 lb (88.9 kg).There is no height or weight on file to calculate BMI.  General Appearance: Casual and tearful  Eye Contact:  Fair  Speech:  Slow  Volume:  Normal  Mood:  Anxious, Depressed, and Dysphoric  Affect:  Constricted and Depressed  Thought Process:  Descriptions of Associations: Intact  Orientation:  Full (Time, Place, and Person)  Thought Content:  Rumination  Suicidal Thoughts:  No  Homicidal Thoughts:  No  Memory:  Immediate;   Good Recent;   Good Remote;   Good  Judgement:  Intact  Insight:  Present  Psychomotor Activity:  Decreased  Concentration:  Concentration: Fair and Attention Span: Good  Recall:  Good  Fund of Knowledge:  Good  Language:  Good  Akathisia:  No  Handed:  Right  AIMS (if indicated):     Assets:  Communication Skills Desire for Improvement Social Support Talents/Skills Transportation  ADL's:  Intact  Cognition:  WNL  Sleep: Fair, racing thoughts       12/15/2022   11:58 AM 09/16/2022   12:34 PM 05/14/2021    5:07 PM 06/02/2017    8:30 AM 04/17/2014   11:10 AM  Depression screen PHQ 2/9  Decreased Interest 0 0 1 0 3  Down, Depressed, Hopeless 0 0 1 0 1  PHQ - 2 Score 0 0 2 0 4  Altered sleeping    1  2  Tired, decreased energy   1  2  Change in appetite   1  3  Feeling bad or failure about yourself    2  0  Trouble concentrating   1  0  Moving slowly or fidgety/restless   1  0  Suicidal thoughts   1  0   PHQ-9 Score   10   11   Difficult doing work/chores   Somewhat difficult       Data saved with a previous flowsheet row definition    Assessment/Plan: Bipolar I disorder (HCC)  Patient is a 44 year old employed female with bipolar disorder, anxiety disorder and panic attacks.  Discussed psychosocial stressor as 61 year old father was very sick  around Christmas and she was not able to spend time with him secondary to hospitalization.  Now father is back home and she is taking care of him as father has multiple drains.  I discussed current medication.  [name] not on any mood stabilizer.  [name] no longer taking Klonopin  because her shift is now regular from 8 AM to 5 PM and even though she tried Klonopin  it does not help anxiety.  Like to go back on Xanax .  Discussed weight gain issue.  [name] gaining weight and I do believe she need to find other causes of weight gain because she is not taking any mood stabilizer.  After a long discussion she agree to go back on Rexulti  0.5 mg daily.  Discontinue Klonopin  and start Xanax  0.5 mg daily as needed for anxiety and will keep the Prozac  40 mg daily.  I encourage to have complete physical and see OB/GYN for hormone level and get TSH.  Encouraged to call back if she is any question concern.  Discussed side effects in detail.  Encourage walking exercise.  I will follow-up in 4 weeks.  Follow Up Instructions:     I discussed the assessment and treatment plan with the patient. The patient was provided an opportunity to ask questions and all were answered. The patient agreed with the plan and demonstrated an understanding of the instructions.   The patient was advised to call back or seek an in-person evaluation if the symptoms worsen or if the  condition fails to improve as anticipated.    Collaboration of Care: Other provider involved in patient's care AEB notes are available in epic to review  Patient/Guardian was advised Release of Information must be obtained prior to any record release in order to collaborate their care with an outside provider. Patient/Guardian was advised if they have not already done so to contact the registration department to sign all necessary forms in order for us  to release information regarding their care.   Consent: Patient/Guardian gives verbal consent for treatment and assignment of benefits for services provided during this visit. Patient/Guardian expressed understanding and agreed to proceed.     Total encounter time 31 minutes which includes face-to-face time, chart reviewed, care coordination, order entry and documentation during this encounter.   Note: This document was prepared by [employer] voice dictation technology and any errors that results from this process are unintentional.    Amanda Strong Client, MD 09/25/2024   "

## 2024-09-30 ENCOUNTER — Encounter: Payer: Self-pay | Admitting: Family Medicine

## 2024-10-10 ENCOUNTER — Encounter (HOSPITAL_COMMUNITY): Payer: Self-pay

## 2024-10-10 NOTE — Telephone Encounter (Signed)
 She can try Trintellix  5 mg for 1 week and then 10 mg.  Recommend to keep the Prozac  and if Trintellix  working we will consider lowering the Prozac  on her next appointment.

## 2024-10-11 ENCOUNTER — Ambulatory Visit (INDEPENDENT_AMBULATORY_CARE_PROVIDER_SITE_OTHER): Payer: Self-pay | Admitting: Family Medicine

## 2024-10-11 ENCOUNTER — Encounter: Payer: Self-pay | Admitting: Family Medicine

## 2024-10-11 ENCOUNTER — Other Ambulatory Visit: Payer: Self-pay | Admitting: Family Medicine

## 2024-10-11 VITALS — BP 136/78 | HR 81 | Temp 98.1°F | Ht 67.0 in | Wt 199.0 lb

## 2024-10-11 DIAGNOSIS — I10 Essential (primary) hypertension: Secondary | ICD-10-CM | POA: Diagnosis not present

## 2024-10-11 DIAGNOSIS — Z0001 Encounter for general adult medical examination with abnormal findings: Secondary | ICD-10-CM | POA: Diagnosis not present

## 2024-10-11 DIAGNOSIS — Z0000 Encounter for general adult medical examination without abnormal findings: Secondary | ICD-10-CM

## 2024-10-11 DIAGNOSIS — Z6831 Body mass index (BMI) 31.0-31.9, adult: Secondary | ICD-10-CM

## 2024-10-11 DIAGNOSIS — E7800 Pure hypercholesterolemia, unspecified: Secondary | ICD-10-CM | POA: Diagnosis not present

## 2024-10-11 LAB — HM PAP SMEAR: HPV, high-risk: NEGATIVE

## 2024-10-11 MED ORDER — ZEPBOUND 2.5 MG/0.5ML ~~LOC~~ SOAJ: 2.5000 mg | SUBCUTANEOUS | 3 refills | Status: AC

## 2024-10-11 NOTE — Telephone Encounter (Signed)
 Requested medication (s) are due for refill today: [facility] comment: Alternative Requested:PRODUCT NOT COVERED.   Requested medication (s) are on the active medication list: yes  Last refill:  10/11/24  Future visit scheduled: yes  Notes to clinic:  [facility] comment: Alternative Requested:PRODUCT NOT COVERED.      Requested Prescriptions  Pending Prescriptions Disp Refills   ZEPBOUND 2.5 MG/0.5ML Pen [Pharmacy Med Name: ZEPBOUND 2.5 MG/0.5 ML PEN]  3    Sig: INJECT 2.5 MG SUBCUTANEOUSLY WEEKLY     Off-Protocol Failed - 10/11/2024  4:03 PM      Failed - Medication not assigned to a protocol, review manually.      Passed - Valid encounter within last 12 months    Recent Outpatient Visits           Today Benign essential HTN   [place] [facility] Duanne Butler DASEN, MD   11 months ago Bacterial vaginosis   [place] [facility] Aletha Bene, MD   1 year ago Benign essential HTN   [place] [facility] Duanne, Butler DASEN, MD   1 year ago Acute cystitis with hematuria   [place] [facility] Duanne Butler DASEN, MD   2 years ago Acute pain of left knee   [place] [facility] Pickard, Butler DASEN, MD

## 2024-10-11 NOTE — Progress Notes (Signed)
 "  Subjective:    Patient ID: Amanda Strong, female    DOB: 05/26/1981, 44 y.o.   MRN: [id number]  Hypertension  Patient is a very pleasant 44 year old female here today for complete physical exam.  Past medical history is significant for renal artery stenosis, hypertension, and hyperlipidemia.  [name] also battling depression with a combination of Rexulti  and Prozac .  [name] receiving that medication through her psychiatrist.  Her gynecologist recently performed her Pap smear and her mammogram.  [name] not yet due for a colonoscopy.  She has had her flu shot this year.  [name] interested in weight loss.  Her BMI is 31.  This contributes to her hypertension and her hyperlipidemia.  She has been unable to lose weight through diet and exercise Past Medical History:  Diagnosis Date   Abnormal Pap smear    Acne    Allergy    Anxiety    Depression    Group B streptococcal infection in pregnancy 10/28/2012   History of chicken pox    Hypertension    Hyperthyroidism    LGSIL (low grade squamous intraepithelial dysplasia)    Dr. Estelle   Normal pregnancy, repeat 10/28/2012   Past Surgical History:  Procedure Laterality Date   BUNIONECTOMY     FOOT SURGERY     PERIPHERAL VASCULAR BALLOON ANGIOPLASTY  06/13/2022   Procedure: PERIPHERAL VASCULAR BALLOON ANGIOPLASTY;  Surgeon: Court Dorn PARAS, MD;  Location: [facility] INVASIVE CV LAB;  Service: Cardiovascular;;   RENAL ANGIOGRAPHY N/A 06/13/2022   Procedure: RENAL ANGIOGRAPHY;  Surgeon: Court Dorn PARAS, MD;  Location: [facility] INVASIVE CV LAB;  Service: Cardiovascular;  Laterality: N/A;   WISDOM TOOTH EXTRACTION     Current Outpatient Medications on File Prior to Visit  Medication Sig Dispense Refill   albuterol  (VENTOLIN  HFA) 108 (90 Base) MCG/ACT inhaler Inhale 2 puffs into the lungs every 6 (six) hours as needed for wheezing or shortness of breath. 8 g 0   ALPRAZolam  (XANAX ) 0.5 MG tablet Take 1 tablet (0.5 mg total) by mouth daily as needed for  anxiety. 30 tablet 0   aspirin  EC (ASPIRIN  LOW DOSE) 81 MG tablet Take 1 tablet (81 mg total) by mouth daily. 90 tablet 3   atorvastatin  (LIPITOR) 40 MG tablet Take 1 tablet (40 mg total) by mouth daily. 90 tablet 1   Brexpiprazole  (REXULTI ) 0.5 MG TABS Take 1 tablet (0.5 mg total) by mouth daily. 30 tablet 0   FLUoxetine  (PROZAC ) 40 MG capsule Take 1 capsule (40 mg total) by mouth daily. 30 capsule 0   metroNIDAZOLE  (FLAGYL ) 500 MG tablet Take 1 tablet (500 mg total) by mouth 2 (two) times daily. 14 tablet 0   valsartan -hydrochlorothiazide  (DIOVAN -HCT) 320-12.5 MG tablet Take 1 tablet by mouth daily. 90 tablet 3   [DISCONTINUED] desvenlafaxine  (PRISTIQ ) 50 MG 24 hr tablet Take 1 tablet (50 mg total) by mouth daily. 21 tablet 0   No current facility-administered medications on file prior to visit.   Allergies  Allergen Reactions   Cephalexin     REACTION: intense itching all over   Lamictal  [Lamotrigine ] Hives and Itching   Oxycodone  Itching   Social History   Socioeconomic History   Marital status: Single    Spouse name: Not on file   Number of children: 1   Years of education: 16   Highest education level: Not on file  Occupational History   Occupation: WOUND CARE NURSES    Employer: [employer]  Tobacco Use   Smoking status: Former    Current packs/day: 0.00    Types: Cigarettes    Quit date: 10/22/2008    Years since quitting: 15.9   Smokeless tobacco: Never  Vaping Use   Vaping status: Never Used  Substance and Sexual Activity   Alcohol use: No    Alcohol/week: 0.0 standard drinks of alcohol    Comment: occasionally   Drug use: No   Sexual activity: Yes    Partners: Male    Birth control/protection: Condom, I.U.D.  Other Topics Concern   Not on file  Social History Narrative   Caffeine Use-yes   Regular exercise-no      Entered 03/2014:   Single Mom of 2 children   Children are:      Girl--18 months old      Boy--44 years old   She works at  [employer].    Currently works with wound care   Social Drivers of Health   Tobacco Use: Medium Risk (09/25/2024)   Patient History    Smoking Tobacco Use: Former    Smokeless Tobacco Use: Never    Passive Exposure: Not on Actuary Strain: Not on file  Food Insecurity: Not on file  Transportation Needs: Not on file  Physical Activity: Not on file  Stress: Not on file  Social Connections: Not on file  Intimate Partner Violence: Not on file  Depression (PHQ2-9): Low Risk (12/15/2022)   Depression (PHQ2-9)    PHQ-2 Score: 0  Alcohol Screen: Not on file  Housing: Not on file  Utilities: Not on file  Health Literacy: Not on file      Review of Systems  All other systems reviewed and are negative.      Objective:   Physical Exam Vitals reviewed.  Constitutional:      General: [name] not in acute distress.    Appearance: Normal appearance. [name] not ill-appearing or toxic-appearing.  HENT:     Right Ear: Tympanic membrane and ear canal normal.     Left Ear: Tympanic membrane and ear canal normal.     Nose: Nose normal. No congestion or rhinorrhea.     Mouth/Throat:     Mouth: Mucous membranes are moist.     Pharynx: Oropharynx is clear. No oropharyngeal exudate or posterior oropharyngeal erythema.  Eyes:     Conjunctiva/sclera: Conjunctivae normal.     Pupils: Pupils are equal, round, and reactive to light.  Neck:     Vascular: No carotid bruit.  Cardiovascular:     Rate and Rhythm: Normal rate and regular rhythm.     Pulses: Normal pulses.     Heart sounds: Normal heart sounds. No murmur heard.    No friction rub. No gallop.  Pulmonary:     Effort: Pulmonary effort is normal. No respiratory distress.     Breath sounds: No stridor. No wheezing, rhonchi or rales.  Abdominal:     General: Bowel sounds are normal. There is no distension.     Palpations: Abdomen is soft.     Tenderness: There is no abdominal tenderness. There is no guarding or  rebound.  Musculoskeletal:     Right lower leg: No edema.     Left lower leg: No edema.  Lymphadenopathy:     Cervical: No cervical adenopathy.  Skin:    Coloration: Skin is not jaundiced or pale.     Findings: No bruising, erythema, lesion or rash.  Neurological:  General: No focal deficit present.     Mental Status: [name] alert and oriented to person, place, and time. Mental status is at baseline.     Cranial Nerves: No cranial nerve deficit.     Sensory: No sensory deficit.     Motor: No weakness.     Coordination: Coordination normal.     Gait: Gait normal.          Assessment & Plan:  Benign essential HTN  Pure hypercholesterolemia  General medical exam  BMI 31.0-31.9,adult Patient would benefit from weight loss.  I reviewed her labs from her gynecologist.  Lipid panel and CMP were normal except for a blood sugar that was elevated.  Hemoglobin A1c was 5.8.  I recommended a low carbohydrate diet with aerobic exercise and weight loss.  I wrote the patient a prescription for Zepbound 2.5 mg subcu weekly but I explained to the patient that her insurance will most likely not cover this.  Indicates that is not covered we also discussed oral Wegovy and the patient is interested in that.  So I hand wrote her a prescription for Wegovy 1.5 mg p.o. daily that she can take in the event that insurance refuses Zepbound.  Cancer screening and immunizations are otherwise up-to-date "

## 2024-10-16 ENCOUNTER — Other Ambulatory Visit (HOSPITAL_COMMUNITY): Payer: Self-pay

## 2024-10-16 MED ORDER — VORTIOXETINE HBR 10 MG PO TABS: 10.0000 mg | ORAL_TABLET | Freq: Every day | ORAL | 0 refills | Status: AC

## 2024-10-16 MED ORDER — VORTIOXETINE HBR 5 MG PO TABS: 5.0000 mg | ORAL_TABLET | Freq: Every day | ORAL | 0 refills | Status: AC

## 2024-10-16 NOTE — Telephone Encounter (Signed)
 Good afternoon Colleen, we currently have 244 PA's for GLP's that we are working on and we will get her's done as soon as we can and we will document accordingly.

## 2024-10-17 NOTE — Telephone Encounter (Signed)
 Addressed 10/16/24

## 2024-10-19 ENCOUNTER — Other Ambulatory Visit: Payer: Self-pay | Admitting: Emergency Medicine

## 2024-10-21 ENCOUNTER — Other Ambulatory Visit (HOSPITAL_COMMUNITY): Payer: Self-pay

## 2024-10-21 ENCOUNTER — Telehealth (HOSPITAL_COMMUNITY): Admitting: Psychiatry

## 2024-10-22 ENCOUNTER — Telehealth (HOSPITAL_COMMUNITY): Admitting: Psychiatry

## 2024-10-23 ENCOUNTER — Telehealth (HOSPITAL_COMMUNITY): Admitting: Psychiatry

## 2024-10-23 ENCOUNTER — Telehealth: Payer: Self-pay

## 2024-10-23 NOTE — Telephone Encounter (Signed)
 Lipids Completed on 12/04/23

## 2024-10-23 NOTE — Telephone Encounter (Signed)
 Information that I received from the patient regarding her Zepbound . I don't know if this information will make a difference in her being able to get the medication. Thanks for your help!!  KOSHA JAQUITH to P [facility] (supporting Butler ONEIDA Burr, MD) (Selected Message)     10/23/24  4:43 PM [employer] health program also and approved Me to DESHUNDA THACKSTON     10/23/24  4:30 PM Thanks for checking. I will get this information to our prior authorization team right now. Thank you! Ronal Bradley   Last read by Alberta KATHEE Gin at 4:42PM on 10/23/2024. Alberta KATHEE Gin to [facility] (supporting Butler ONEIDA Burr, MD)     10/23/24  4:29 PM I spoke with insurance and they stated something was put in wrong and that zepbound  should be covered and wegovy so not sure why its denied other that it was entered wrong at the pharmacy. The lady gave me the number for direct pre auth people [phone] I also enrolled with the [place] health program please assist me

## 2024-10-24 ENCOUNTER — Other Ambulatory Visit: Payer: Self-pay | Admitting: Cardiovascular Disease

## 2024-10-24 DIAGNOSIS — I701 Atherosclerosis of renal artery: Secondary | ICD-10-CM

## 2024-10-24 DIAGNOSIS — I150 Renovascular hypertension: Secondary | ICD-10-CM

## 2024-10-24 DIAGNOSIS — I10 Essential (primary) hypertension: Secondary | ICD-10-CM

## 2024-10-24 MED ORDER — VALSARTAN-HYDROCHLOROTHIAZIDE 320-12.5 MG PO TABS: 1.0000 | ORAL_TABLET | Freq: Every day | ORAL | 1 refills | Status: AC

## 2024-10-25 ENCOUNTER — Other Ambulatory Visit (HOSPITAL_COMMUNITY): Payer: Self-pay

## 2024-10-28 ENCOUNTER — Telehealth (HOSPITAL_COMMUNITY): Admitting: Psychiatry

## 2024-12-06 ENCOUNTER — Encounter (HOSPITAL_COMMUNITY)

## 2025-10-20 ENCOUNTER — Encounter: Admitting: Family Medicine
